# Patient Record
Sex: Female | Born: 1960 | Race: White | Hispanic: No | Smoking: Current every day smoker
Health system: Southern US, Community
[De-identification: ages and names within clinical notes are randomized; demographics above are authoritative.]

## PROBLEM LIST (undated history)

## (undated) DIAGNOSIS — L409 Psoriasis, unspecified: Secondary | ICD-10-CM

## (undated) DIAGNOSIS — R011 Cardiac murmur, unspecified: Secondary | ICD-10-CM

## (undated) DIAGNOSIS — L299 Pruritus, unspecified: Secondary | ICD-10-CM

## (undated) DIAGNOSIS — Z972 Presence of dental prosthetic device (complete) (partial): Secondary | ICD-10-CM

## (undated) DIAGNOSIS — IMO0001 Reserved for inherently not codable concepts without codable children: Secondary | ICD-10-CM

## (undated) DIAGNOSIS — J302 Other seasonal allergic rhinitis: Secondary | ICD-10-CM

## (undated) DIAGNOSIS — K219 Gastro-esophageal reflux disease without esophagitis: Secondary | ICD-10-CM

## (undated) DIAGNOSIS — F17200 Nicotine dependence, unspecified, uncomplicated: Secondary | ICD-10-CM

## (undated) DIAGNOSIS — L309 Dermatitis, unspecified: Secondary | ICD-10-CM

## (undated) DIAGNOSIS — Z8679 Personal history of other diseases of the circulatory system: Secondary | ICD-10-CM

## (undated) DIAGNOSIS — H539 Unspecified visual disturbance: Secondary | ICD-10-CM

## (undated) DIAGNOSIS — F40240 Claustrophobia: Secondary | ICD-10-CM

## (undated) DIAGNOSIS — R112 Nausea with vomiting, unspecified: Secondary | ICD-10-CM

## (undated) DIAGNOSIS — Z9889 Other specified postprocedural states: Secondary | ICD-10-CM

## (undated) HISTORY — PX: CERVICAL DISCECTOMY: SHX98

## (undated) HISTORY — DX: Psoriasis, unspecified: L40.9

## (undated) HISTORY — DX: Nicotine dependence, unspecified, uncomplicated: F17.200

## (undated) HISTORY — PX: ROTATOR CUFF REPAIR: SHX139

## (undated) HISTORY — DX: Pruritus, unspecified: L29.9

## (undated) HISTORY — DX: Other seasonal allergic rhinitis: J30.2

## (undated) HISTORY — DX: Personal history of other diseases of the circulatory system: Z86.79

## (undated) HISTORY — DX: Claustrophobia: F40.240

## (undated) HISTORY — DX: Nausea with vomiting, unspecified: R11.2

## (undated) HISTORY — DX: Unspecified visual disturbance: H53.9

## (undated) HISTORY — DX: Other specified postprocedural states: Z98.890

## (undated) HISTORY — DX: Reserved for inherently not codable concepts without codable children: IMO0001

---

## 1995-01-27 ENCOUNTER — Inpatient Hospital Stay (HOSPITAL_BASED_OUTPATIENT_CLINIC_OR_DEPARTMENT_OTHER)
Admission: RE | Admit: 1995-01-27 | Disposition: A | Discharge status: Home / Self Care | Payer: Self-pay | Source: Ambulatory Visit | Admitting: Specialist

## 1996-07-05 ENCOUNTER — Inpatient Hospital Stay (HOSPITAL_BASED_OUTPATIENT_CLINIC_OR_DEPARTMENT_OTHER)
Admission: RE | Admit: 1996-07-05 | Disposition: A | Discharge status: Home / Self Care | Payer: Self-pay | Source: Ambulatory Visit | Admitting: Specialist

## 2001-05-20 ENCOUNTER — Encounter: Payer: Self-pay | Admitting: Neurosurgery

## 2001-05-24 ENCOUNTER — Encounter: Payer: Self-pay | Admitting: Neurosurgery

## 2001-05-24 ENCOUNTER — Inpatient Hospital Stay (HOSPITAL_COMMUNITY): Admission: RE | Admit: 2001-05-24 | Discharge: 2001-05-25 | Payer: Self-pay | Admitting: Neurosurgery

## 2001-06-08 HISTORY — PX: APPENDECTOMY (OPEN): SHX54

## 2001-10-06 HISTORY — PX: VARICOSE VEIN SURGERY: SHX832

## 2004-12-12 ENCOUNTER — Emergency Department: Admit: 2004-12-12 | Payer: Self-pay | Source: Emergency Department | Admitting: Emergency Medicine

## 2004-12-12 ENCOUNTER — Emergency Department: Admit: 2004-12-12 | Payer: Self-pay | Source: Emergency Department

## 2005-07-08 DIAGNOSIS — B3731 Acute candidiasis of vulva and vagina: Secondary | ICD-10-CM | POA: Insufficient documentation

## 2005-12-21 DIAGNOSIS — R9389 Abnormal findings on diagnostic imaging of other specified body structures: Secondary | ICD-10-CM | POA: Insufficient documentation

## 2005-12-21 DIAGNOSIS — R072 Precordial pain: Secondary | ICD-10-CM | POA: Insufficient documentation

## 2005-12-21 DIAGNOSIS — J342 Deviated nasal septum: Secondary | ICD-10-CM | POA: Insufficient documentation

## 2007-10-26 DIAGNOSIS — J029 Acute pharyngitis, unspecified: Secondary | ICD-10-CM | POA: Insufficient documentation

## 2008-01-02 DIAGNOSIS — E559 Vitamin D deficiency, unspecified: Secondary | ICD-10-CM | POA: Insufficient documentation

## 2008-07-23 ENCOUNTER — Ambulatory Visit
Admit: 2008-07-23 | Disposition: A | Discharge status: Home / Self Care | Payer: Self-pay | Source: Ambulatory Visit | Admitting: Plastic Surgery

## 2008-12-21 ENCOUNTER — Ambulatory Visit: Admission: RE | Admit: 2008-12-21 | Payer: Self-pay | Source: Ambulatory Visit | Admitting: Specialist

## 2009-04-17 ENCOUNTER — Ambulatory Visit: Admission: RE | Admit: 2009-04-17 | Payer: Self-pay | Source: Ambulatory Visit | Admitting: Plastic Surgery

## 2009-06-08 HISTORY — PX: LUMPECTOMY: SHX4764

## 2009-06-08 HISTORY — PX: AUGMENTATION, BREAST, IMPLANT (MEDICAL): SHX3208

## 2010-01-27 DIAGNOSIS — F32A Depression, unspecified: Secondary | ICD-10-CM | POA: Insufficient documentation

## 2010-06-28 ENCOUNTER — Emergency Department: Payer: Self-pay | Admitting: Emergency Medicine

## 2010-09-07 ENCOUNTER — Emergency Department: Payer: Self-pay | Admitting: Emergency Medicine

## 2011-03-27 NOTE — Op Note (Unsigned)
Account Number: [id number]      Document ID: [id number]      Admit Date: 04/17/2009      Procedure Date: 04/17/2009            Patient Location: DISCHARGED 04/17/2009      Patient Type: A            SURGEON: Marybelle Killings MD      ASSISTANT:                  PREOPERATIVE DIAGNOSES:      Hypomastia and facial aging.            POSTOPERATIVE DIAGNOSES:      Hypomastia and facial aging.            TITLES OF PROCEDURES:      1.  Transaxial breast augmentation, on the left 275 mL filled to 300 mL, on      the right 275 mL filled to 300 mL, with saline Mentor implants.      2.  Fat grafting, left cheek 7 mL, right cheek 7 mL, left labiomandibular      fold 3 mL, right labiomandibular fold 3 mL, left nasolabial fold 3 mL,      right nasolabial fold 3 mL, upper lip 5 mL mL, lower lip 5 mL, total of 36      mL.            ANESTHESIA:      LMA, with local anesthesia.            ESTIMATED BLOOD LOSS:      Minimal.            DRAINS:      None.            SPECIMENS:      None.            COMPLICATIONS:      None.            INDICATIONS:      The patient is well known to me from having done multiple fillers on her in      the past.  She came now requesting a breast augmentation and fat grafting      to her face.  I discussed with her the procedure at length, and she was      scheduled to have that earlier this year; however, she had a right breast      mass that was found on mammogram that delayed the surgery, underwent a      lumpectomy, and that turned out to be benign, so patient delayed her      surgery.  After she was cleared by her medical doctor, she came back to      have breast augmentation, as well as fat grafting for facial rejuvenation.            PAST MEDICAL HISTORY:      Significant for right breast lumpectomy in July 2010.  Otherwise, she is on      no medications, no allergies, does not smoke, and was cleared by her      medical doctor.            PHYSICAL EXAMINATION:      She had hypomastia with mild-to-moderate  ptosis, and she does have paucity      of fat in her face.  So we discussed breast augmentation.  She liked the  300 mL range, from 275 to 300 mL, and she wanted some top fullness.  She      also wanted some permanent filler for her face, and she elected to have fat      grafting.  I discussed with her the risks and benefits of both operations.      I showed her before and after pictures and the different risks and      complications associated with each.  After multiple visits, the patient was      very clear as to the procedure, and she elected to have a breast      augmentation through a transaxillary approach with saline implants, as well      as fat grafting to her face.            DESCRIPTION OF PROCEDURE:      The patient was identified, was taken to the operating room.  In the      operating room, she was given preoperative IV antibiotics, pneumatics      placed on lower extremities, and was intubated uneventfully.  The first      operation was the breast augmentation.  The breasts were infiltrated with      tumescent solution.  After that was allowed to work, she was prepped and      draped in normal sterile fashion.  Incisions were made in the axillae.      Dissection was carried out to create submuscular pockets.  Sizer implants      were placed and filled to 300 mL each.  The patient was sat up and noted to      have nice shape and size of her breasts, and that was along what the      patient had requested in terms of size.  The sizers were then removed.  The      area was prepped and draped again.  The surgeon changed his gloves, and the      Smooth Round Saline Mentor implant, 275 mL, was placed in the left pocket      and filled to 300 mL with saline.  Also, another 275 mL Smooth Round Saline      Mentor implant was placed in the right pocket and filled to 300 mL with      saline.  The patient was sat up and noted to have nice shape and size of      her breasts, so the filling tubes were removed,  and incisions were closed      with 3-0 Vicryl sutures and 4-0 Vicryl subcuticular sutures.  Steri-Strips      were placed.  Dressings were placed.  The patient was placed in a bra.            Next operation was her fat grafting to the face.  The face and the abdomen      were prepped and draped in normal sterile fashion.  Fat was harvested from      the abdomen in a [employer] fashion.  It was centrifuged, and the      supernatant fluid was removed, as well as the oily layer.  Fat was filled      in 1 mL syringes and was injected to the cheeks, labiomental folds,      labiomandibular folds, labial folds, upper lips, and lower lips, for a      total 36 mL, as stated in the brief operative note in  a Mirian Capuchin      fashion.  There were no incisions to be closed, so patient was then      awakened and noted to have nice symmetry in her face after the fat      grafting.  She was taken to the recovery room after she was placed in a bra      and was in no apparent distress.                              _______________________________     Date/Time Signed: _____________      Marybelle Killings MD ([id number])            D:  04/17/2009 15:54 PM by Dr. Marybelle Killings, MD ([id number])      T:  04/17/2009 22:35 PM by [id number]          Everlean Cherry: [id number]) (Doc ID: [id number])                  WN:UUVOZD Shahara Hartsfield MD      Patient Chart

## 2011-03-27 NOTE — Op Note (Signed)
Account Number: [id number]      Document ID: [id number]      Admit Date: 12/21/2008      Procedure Date: 12/21/2008            Patient Location: [id number]      Patient Type: A            SURGEON: Barbara Cower MD      ASSISTANT:  Marilynne Halsted MD                  PREOPERATIVE DIAGNOSIS:      Mass, right breast.            POSTOPERATIVE DIAGNOSIS:      Mass, right breast.            TITLE OF PROCEDURE:      Lumpectomy, right breast, with ultrasound needle localization.            INDICATIONS:      The patient is a 50 year old female being evaluated in the office for a      lump in the right breast, and she was advised to have this removed.      Surgery, benefits, risks and potential complications were discussed with      her.  She understood and accepted the plan as outlined.            DESCRIPTION OF PROCEDURE:      The patient was placed in the supine position on the operating table.      Following successful needle localization by the ultrasound department, she      was given IV sedation.  A gram of Ancef was administered intravenously as      were pneumatic stockings.  The right breast was then prepped and draped in      the usual sterile manner after the needle had been cut to just an inch      above skin level.            A small incision was outlined in the areolar edge      between the 7 and 9 o'clock time zones.  This area was first infiltrated      with 1% lidocaine combined with 0.5% Marcaine.  Incision was then scored      with a 15 blade knife, carried down through the subcutaneous tissue.      Bleeders that were encountered were cauterized.  The needle was then      further developed into the incision with careful dissection.  The breast      tissue was carefully dissected away from the areolar complex.  The needle      apparently was stuck in the skin itself and was therefore removed.  A core      of tissue was then excised underneath the areolar complex and handed off to      the pathology department.  It  contained several dilated ducts and      fibrocystic condition.            Bleeders that were present were electrocauterized, and the area was      irrigated.  Satisfied with hemostasis, then the incision was closed.      Deeper layers were closed with interrupted 3-0 chromic sutures.  Skin was      closed with a 4-0 PDS subcuticular stitch.  Wounds were then cleaned,      dried, dressed with Steri-Strips, 4 x 4's, and tape.  The patient was      awakened and transferred back to the recovery room in satisfactory      condition.  Sponge counts, instrument counts, and needle counts reported to      be correct.  The radiology department confirmed the presence of the nodule      in the specimen, and the specimen was then handed off to the pathology      department.                        Electronic Signing Provider      _______________________________     Date/Time Signed: _____________      Barbara Cower MD [id number])            D:  12/21/2008 11:39 AM by Dr. Barbara Cower, MD [phone])      T:  12/21/2008 14:07 PM by [id number]          Everlean Cherry: [id number]) (Doc ID: [id number])                  cc:Olevia Westervelt MD      Jodelle Red MD

## 2011-07-10 HISTORY — PX: COLONOSCOPY, DIAGNOSTIC (SCREENING): SHX174

## 2011-10-30 ENCOUNTER — Emergency Department: Payer: Self-pay | Admitting: Emergency Medicine

## 2011-10-30 LAB — CBC
HCT: 39.4 % (ref 35.0–47.0)
HGB: 13.4 g/dL (ref 12.0–16.0)
MCH: 35.6 pg — ABNORMAL HIGH (ref 26.0–34.0)
MCHC: 33.9 g/dL (ref 32.0–36.0)
MCV: 105 fL — ABNORMAL HIGH (ref 80–100)
Platelet: 214 10*3/uL (ref 150–440)
RBC: 3.76 10*6/uL — ABNORMAL LOW (ref 3.80–5.20)
RDW: 12.7 % (ref 11.5–14.5)
WBC: 10.7 10*3/uL (ref 3.6–11.0)

## 2011-10-30 LAB — CK TOTAL AND CKMB (NOT AT ARMC)
CK, Total: 70 U/L (ref 21–215)
CK-MB: <0.5 ng/mL — ABNORMAL LOW (ref 0.5–3.6)

## 2011-10-30 LAB — COMPREHENSIVE METABOLIC PANEL
Albumin: 3.7 g/dL (ref 3.4–5.0)
Alkaline Phosphatase: 58 U/L (ref 50–136)
Anion Gap: 9 (ref 7–16)
BUN: 8 mg/dL (ref 7–18)
Bilirubin,Total: 0.5 mg/dL (ref 0.2–1.0)
Calcium, Total: 9 mg/dL (ref 8.5–10.1)
Chloride: 103 mmol/L (ref 98–107)
Co2: 24 mmol/L (ref 21–32)
Creatinine: 0.52 mg/dL — ABNORMAL LOW (ref 0.60–1.30)
EGFR (African American): >60
EGFR (Non-African Amer.): >60
Glucose: 93 mg/dL (ref 65–99)
Osmolality: 270 (ref 275–301)
Potassium: 3.4 mmol/L — ABNORMAL LOW (ref 3.5–5.1)
SGOT(AST): 23 U/L (ref 15–37)
SGPT (ALT): 16 U/L
Sodium: 136 mmol/L (ref 136–145)
Total Protein: 7.8 g/dL (ref 6.4–8.2)

## 2011-10-30 LAB — URINALYSIS, COMPLETE
Bacteria: NONE SEEN
Bilirubin,UR: NEGATIVE
Glucose,UR: NEGATIVE mg/dL (ref 0–75)
Leukocyte Esterase: NEGATIVE
Nitrite: NEGATIVE
Ph: 5 (ref 4.5–8.0)
Protein: NEGATIVE
RBC,UR: <1 /HPF (ref 0–5)
Specific Gravity: 1.012 (ref 1.003–1.030)
Squamous Epithelial: 4
WBC UR: 1 /HPF (ref 0–5)

## 2011-10-30 LAB — TROPONIN I: Troponin-I: <0.02 ng/mL

## 2011-10-30 LAB — AMYLASE: Amylase: 37 U/L (ref 25–115)

## 2011-10-30 LAB — LIPASE, BLOOD: Lipase: 165 U/L (ref 73–393)

## 2012-06-16 DIAGNOSIS — R634 Abnormal weight loss: Secondary | ICD-10-CM | POA: Insufficient documentation

## 2012-11-11 DIAGNOSIS — G4700 Insomnia, unspecified: Secondary | ICD-10-CM | POA: Insufficient documentation

## 2013-03-06 DIAGNOSIS — S93409A Sprain of unspecified ligament of unspecified ankle, initial encounter: Secondary | ICD-10-CM | POA: Insufficient documentation

## 2013-03-17 DIAGNOSIS — L709 Acne, unspecified: Secondary | ICD-10-CM | POA: Insufficient documentation

## 2013-07-17 DIAGNOSIS — R209 Unspecified disturbances of skin sensation: Secondary | ICD-10-CM | POA: Insufficient documentation

## 2014-12-06 ENCOUNTER — Emergency Department (HOSPITAL_COMMUNITY): Payer: Self-pay

## 2014-12-06 ENCOUNTER — Emergency Department (HOSPITAL_COMMUNITY)
Admission: EM | Admit: 2014-12-06 | Discharge: 2014-12-06 | Disposition: A | Discharge status: Home / Self Care | Payer: Self-pay | Attending: Emergency Medicine | Admitting: Emergency Medicine

## 2014-12-06 ENCOUNTER — Encounter (HOSPITAL_COMMUNITY): Payer: Self-pay | Admitting: Cardiology

## 2014-12-06 DIAGNOSIS — R1013 Epigastric pain: Secondary | ICD-10-CM | POA: Insufficient documentation

## 2014-12-06 DIAGNOSIS — R1011 Right upper quadrant pain: Secondary | ICD-10-CM | POA: Insufficient documentation

## 2014-12-06 DIAGNOSIS — Z91040 Latex allergy status: Secondary | ICD-10-CM | POA: Insufficient documentation

## 2014-12-06 DIAGNOSIS — R1012 Left upper quadrant pain: Secondary | ICD-10-CM | POA: Insufficient documentation

## 2014-12-06 DIAGNOSIS — R112 Nausea with vomiting, unspecified: Secondary | ICD-10-CM | POA: Insufficient documentation

## 2014-12-06 DIAGNOSIS — Z720 Tobacco use: Secondary | ICD-10-CM | POA: Insufficient documentation

## 2014-12-06 DIAGNOSIS — R05 Cough: Secondary | ICD-10-CM | POA: Insufficient documentation

## 2014-12-06 DIAGNOSIS — R079 Chest pain, unspecified: Secondary | ICD-10-CM | POA: Insufficient documentation

## 2014-12-06 DIAGNOSIS — Z79899 Other long term (current) drug therapy: Secondary | ICD-10-CM | POA: Insufficient documentation

## 2014-12-06 LAB — COMPREHENSIVE METABOLIC PANEL
ALK PHOS: 71 U/L (ref 38–126)
ALT: 18 U/L (ref 14–54)
AST: 35 U/L (ref 15–41)
Albumin: 4.6 g/dL (ref 3.5–5.0)
Anion gap: 14 (ref 5–15)
BUN: 19 mg/dL (ref 6–20)
CALCIUM: 10 mg/dL (ref 8.9–10.3)
CHLORIDE: 87 mmol/L — AB (ref 101–111)
CO2: 31 mmol/L (ref 22–32)
CREATININE: 1.07 mg/dL — AB (ref 0.44–1.00)
GFR calc Af Amer: >60 mL/min (ref >60)
GFR calc non Af Amer: 58 mL/min — ABNORMAL LOW (ref >60)
Glucose, Bld: 129 mg/dL — ABNORMAL HIGH (ref 65–99)
Potassium: 2.9 mmol/L — ABNORMAL LOW (ref 3.5–5.1)
SODIUM: 132 mmol/L — AB (ref 135–145)
Total Bilirubin: 1.1 mg/dL (ref 0.3–1.2)
Total Protein: 8.3 g/dL — ABNORMAL HIGH (ref 6.5–8.1)

## 2014-12-06 LAB — CBC WITH DIFFERENTIAL/PLATELET
BASOS ABS: 0 10*3/uL (ref 0.0–0.1)
Basophils Relative: 0 % (ref 0–1)
EOS ABS: 0.1 10*3/uL (ref 0.0–0.7)
EOS PCT: 1 % (ref 0–5)
HCT: 41.9 % (ref 36.0–46.0)
Hemoglobin: 14.9 g/dL (ref 12.0–15.0)
LYMPHS PCT: 21 % (ref 12–46)
Lymphs Abs: 2.2 10*3/uL (ref 0.7–4.0)
MCH: 34.8 pg — ABNORMAL HIGH (ref 26.0–34.0)
MCHC: 35.6 g/dL (ref 30.0–36.0)
MCV: 97.9 fL (ref 78.0–100.0)
Monocytes Absolute: 1.5 10*3/uL — ABNORMAL HIGH (ref 0.1–1.0)
Monocytes Relative: 14 % — ABNORMAL HIGH (ref 3–12)
NEUTROS PCT: 64 % (ref 43–77)
Neutro Abs: 6.7 10*3/uL (ref 1.7–7.7)
PLATELETS: 315 10*3/uL (ref 150–400)
RBC: 4.28 MIL/uL (ref 3.87–5.11)
RDW: 11.8 % (ref 11.5–15.5)
WBC: 10.4 10*3/uL (ref 4.0–10.5)

## 2014-12-06 LAB — URINE MICROSCOPIC-ADD ON

## 2014-12-06 LAB — I-STAT TROPONIN, ED: Troponin i, poc: 0 ng/mL (ref 0.00–0.08)

## 2014-12-06 LAB — URINALYSIS, ROUTINE W REFLEX MICROSCOPIC
Glucose, UA: NEGATIVE mg/dL
Ketones, ur: 15 mg/dL — AB
Leukocytes, UA: NEGATIVE
Nitrite: NEGATIVE
Protein, ur: >300 mg/dL — AB
SPECIFIC GRAVITY, URINE: 1.031 — AB (ref 1.005–1.030)
Urobilinogen, UA: 0.2 mg/dL (ref 0.0–1.0)
pH: 5.5 (ref 5.0–8.0)

## 2014-12-06 LAB — LIPASE, BLOOD: Lipase: 32 U/L (ref 22–51)

## 2014-12-06 MED ORDER — MORPHINE SULFATE 4 MG/ML IJ SOLN
4.0000 mg | Freq: Once | INTRAMUSCULAR | Status: AC
  Administered 2014-12-06: 4 mg via INTRAVENOUS
  Filled 2014-12-06: qty 1

## 2014-12-06 MED ORDER — SODIUM CHLORIDE 0.9 % IV BOLUS (SEPSIS)
1000.0000 mL | Freq: Once | INTRAVENOUS | Status: AC
  Administered 2014-12-06: 1000 mL via INTRAVENOUS

## 2014-12-06 MED ORDER — ONDANSETRON HCL 4 MG/2ML IJ SOLN
4.0000 mg | Freq: Once | INTRAMUSCULAR | Status: AC
  Administered 2014-12-06: 4 mg via INTRAVENOUS
  Filled 2014-12-06: qty 2

## 2014-12-06 MED ORDER — POTASSIUM CHLORIDE 10 MEQ/100ML IV SOLN
10.0000 meq | Freq: Once | INTRAVENOUS | Status: AC
  Administered 2014-12-06: 10 meq via INTRAVENOUS
  Filled 2014-12-06: qty 100

## 2014-12-06 MED ORDER — ONDANSETRON HCL 4 MG PO TABS: 4.0000 mg | ORAL_TABLET | Freq: Three times a day (TID) | ORAL | Status: AC | PRN

## 2014-12-06 MED ORDER — ALUM & MAG HYDROXIDE-SIMETH 200-200-20 MG/5ML PO SUSP
30.0000 mL | Freq: Once | ORAL | Status: AC
  Administered 2014-12-06: 30 mL via ORAL
  Filled 2014-12-06: qty 30

## 2014-12-06 MED ORDER — POTASSIUM CHLORIDE ER 10 MEQ PO TBCR: 10.0000 meq | EXTENDED_RELEASE_TABLET | Freq: Every day | ORAL | Status: DC

## 2014-12-06 MED ORDER — RANITIDINE HCL 150 MG PO CAPS: 150.0000 mg | ORAL_CAPSULE | Freq: Every day | ORAL | Status: DC

## 2014-12-06 NOTE — ED Provider Notes (Signed)
CSN: [id number]     Arrival date & time 12/06/14  1436 History   First MD Initiated Contact with Patient 12/06/14 1545     Chief Complaint  Patient presents with   Abdominal Pain   Chest Pain     (Consider location/radiation/quality/duration/timing/severity/associated sxs/prior Treatment) Patient is a 54 y.o. female presenting with abdominal pain and chest pain.  Abdominal Pain Pain location:  Epigastric Pain quality: heavy and squeezing   Pain radiates to:  Chest Pain severity:  Severe Onset quality:  Gradual Duration:  2 days Timing:  Constant Progression:  Worsening Chronicity:  Recurrent (once prior, self resolved) Context: not alcohol use   Relieved by:  Nothing Worsened by:  Vomiting and palpation Ineffective treatments:  Eating (pepto) Associated symptoms: chest pain, cough, nausea and vomiting   Associated symptoms: no chills, no constipation, no diarrhea, no dysuria, no fatigue, no fever, no hematemesis, no hematochezia, no melena, no shortness of breath and no sore throat   Vomiting:    Quality:  Stomach contents   Number of occurrences:  10   Severity:  Moderate   Duration:  2 days   Timing:  Intermittent Risk factors: no alcohol abuse   Chest Pain Associated symptoms: abdominal pain, cough, nausea and vomiting   Associated symptoms: no back pain, no dizziness, no fatigue, no fever, no headache, no palpitations, no shortness of breath and no weakness     History reviewed. No pertinent past medical history. History reviewed. No pertinent past surgical history. History reviewed. No pertinent family history. History  Substance Use Topics   Smoking status: Current Every Day Smoker   Smokeless tobacco: Not on file   Alcohol Use: Yes   OB History    No data available     Review of Systems  Constitutional: Negative for fever, chills, appetite change and fatigue.  HENT: Negative for congestion, ear pain, facial swelling, mouth sores and sore throat.    Eyes: Negative for visual disturbance.  Respiratory: Positive for cough. Negative for chest tightness and shortness of breath.   Cardiovascular: Positive for chest pain. Negative for palpitations.  Gastrointestinal: Positive for nausea, vomiting and abdominal pain. Negative for diarrhea, constipation, blood in stool, melena, hematochezia and hematemesis.  Endocrine: Negative for cold intolerance and heat intolerance.  Genitourinary: Negative for dysuria, frequency, decreased urine volume and difficulty urinating.  Musculoskeletal: Negative for back pain and neck stiffness.  Skin: Negative for rash.  Neurological: Negative for dizziness, weakness, light-headedness and headaches.  All other systems reviewed and are negative.     Allergies  Sulfa antibiotics and Latex  Home Medications   Prior to Admission medications   Medication Sig Start Date End Date Taking? Authorizing Provider  ondansetron (ZOFRAN) 4 MG tablet Take 1 tablet (4 mg total) by mouth every 8 (eight) hours as needed for nausea or vomiting. 12/06/14 12/09/14  Drema Pry, MD  potassium chloride (K-DUR) 10 MEQ tablet Take 1 tablet (10 mEq total) by mouth daily. 12/06/14 12/20/14  Drema Pry, MD  ranitidine (ZANTAC) 150 MG capsule Take 1 capsule (150 mg total) by mouth daily. 12/06/14   Drema Pry, MD   BP 139/90 mmHg   Pulse 117   Temp(Src) 98.7 F (37.1 C) (Oral)   Resp 18   Wt 110 lb (49.896 kg)   SpO2 99% Physical Exam  Constitutional: She is oriented to person, place, and time. She appears well-developed and well-nourished. No distress.  HENT:  Head: Normocephalic and atraumatic.  Right Ear: External ear normal.  Left Ear: External ear normal.  Nose: Nose normal.  Eyes: Conjunctivae and EOM are normal. Pupils are equal, round, and reactive to light. Right eye exhibits no discharge. Left eye exhibits no discharge. No scleral icterus.  Neck: Normal range of motion. Neck supple.  Cardiovascular: Normal rate,  regular rhythm and normal heart sounds.  Exam reveals no gallop and no friction rub.   No murmur heard. Pulmonary/Chest: Effort normal and breath sounds normal. No stridor. No respiratory distress. She has no wheezes.  Abdominal: Soft. She exhibits no distension. There is no hepatosplenomegaly. There is tenderness in the right upper quadrant, epigastric area and left upper quadrant. There is guarding. There is no rigidity, no rebound, no CVA tenderness and no tenderness at McBurney's point. No hernia.  Musculoskeletal: She exhibits no edema or tenderness.  Neurological: She is alert and oriented to person, place, and time.  Skin: Skin is warm and dry. No rash noted. She is not diaphoretic. No erythema.  Psychiatric: She has a normal mood and affect.    ED Course  Procedures (including critical care time) Labs Review Labs Reviewed  CBC WITH DIFFERENTIAL/PLATELET - Abnormal; Notable for the following:    MCH 34.8 (*)    Monocytes Relative 14 (*)    Monocytes Absolute 1.5 (*)    All other components within normal limits  COMPREHENSIVE METABOLIC PANEL - Abnormal; Notable for the following:    Sodium 132 (*)    Potassium 2.9 (*)    Chloride 87 (*)    Glucose, Bld 129 (*)    Creatinine, Ser 1.07 (*)    Total Protein 8.3 (*)    GFR calc non Af Amer 58 (*)    All other components within normal limits  URINALYSIS, ROUTINE W REFLEX MICROSCOPIC (NOT AT [facility]) - Abnormal; Notable for the following:    Color, Urine AMBER (*)    APPearance HAZY (*)    Specific Gravity, Urine 1.031 (*)    Hgb urine dipstick MODERATE (*)    Bilirubin Urine SMALL (*)    Ketones, ur 15 (*)    Protein, ur >300 (*)    All other components within normal limits  URINE MICROSCOPIC-ADD ON - Abnormal; Notable for the following:    Squamous Epithelial / LPF MANY (*)    Bacteria, UA FEW (*)    Casts GRANULAR CAST (*)    All other components within normal limits  LIPASE, BLOOD  H. PYLORI ANTIBODY, IGG  I-STAT  TROPOININ, ED    Imaging Review Dg Chest 2 View  12/06/2014   CLINICAL DATA:  Abdominal and chest pain for 2 days, nausea, vomiting, smoker  EXAM: CHEST  2 VIEW  COMPARISON:  10/30/2011  FINDINGS: Normal heart size, mediastinal contours and pulmonary vascularity.  Lungs hyperinflated but clear.  No pleural effusion or pneumothorax.  Bones appear demineralized.  Prior cervical spine fusion.  IMPRESSION: Question COPD.  No acute abnormalities.   Electronically Signed   By: Ulyses Southward M.D.   On: 12/06/2014 16:43   US Abdomen Complete  12/06/2014   CLINICAL DATA:  Epigastric pain  EXAM: ULTRASOUND ABDOMEN COMPLETE  COMPARISON:  10/30/2011  FINDINGS: Gallbladder: No gallstones or wall thickening visualized. No sonographic Murphy sign noted.  Common bile duct: Diameter: 4.6 mm  Liver: No focal lesion identified. Within normal limits in parenchymal echogenicity.  IVC: No abnormality visualized.  Pancreas: Visualized portion unremarkable.  Spleen: Size and appearance within normal limits.  Right Kidney: Length: 10.9 cm. Echogenicity within normal  limits. No mass or hydronephrosis visualized.  Left Kidney: Length: 10.7 cm. Echogenicity within normal limits. No mass or hydronephrosis visualized.  Abdominal aorta: No aneurysm visualized.  Other findings: None.  IMPRESSION: No acute abnormality noted.   Electronically Signed   By: Alcide CleverMark  Lukens M.D.   On: 12/06/2014 19:01     EKG Interpretation   Date/Time:  Thursday December 06 2014 14:47:49 EDT Ventricular Rate:  108 PR Interval:  138 QRS Duration: 74 QT Interval:  364 QTC Calculation: 487 R Axis:   75 Text Interpretation:  Sinus tachycardia Biatrial enlargement Nonspecific  ST and T wave abnormality Abnormal ECG Sinus tachycardia ST-t wave  abnormality Abnormal ekg Confirmed by Gerhard MunchLOCKWOOD, ROBERT  MD ([id number]) on  12/06/2014 5:27:12 PM      MDM   2 days of progressively worsening epigastric pain with associated nausea vomiting. Rest of the history and  exam as above. Diagnostics without evidence of pancreatitis, cholecystitis, cholelithiasis, pneumonia, pleural effusions. Low clinical suspicion for colitis, appendicitis, diverticulitis, small bowel obstruction. EKG with sinus tachycardia and nonspecific ST and T-wave abnormalities with no evidence of acute ischemia, arrhythmia, or blocks. Troponin negative. Low clinical suspicion for ACS. CBC without leukocytosis or anemia. There is evidence of dehydration and the labs. Patient was given multiple liters of IV fluids. Patient also provided with pain and nausea medication. Labs revealed hypokalemia and patient was repeated.  She is in good condition is stable for discharge with strict return precautions. [facility] to establish care with a primary care provider and follow up if her symptoms worsen or do not improve. She is provided prescription for K dur, Zofran, Zantac.  Vision seen in conjunction with Dr. Jeraldine LootsLockwood.  Final diagnoses:  Epigastric pain    Drema PryPedro Haddy Mullinax, MD 12/06/14 2025  Gerhard Munchobert Lockwood, MD 12/07/14 [phone]

## 2014-12-06 NOTE — ED Notes (Signed)
Pt reports abd pain and chest pain for the past 2 days. Reports some n/v. Skin warm and dry.

## 2014-12-06 NOTE — Discharge Instructions (Signed)
Abdominal Pain, Women °Abdominal (stomach, pelvic, or belly) pain can be caused by many things. It is important to tell your doctor: °· The location of the pain. °· Does it come and go or is it present all the time? °· Are there things that start the pain (eating certain foods, exercise)? °· Are there other symptoms associated with the pain (fever, nausea, vomiting, diarrhea)? °All of this is helpful to know when trying to find the cause of the pain. °CAUSES  °· Stomach: virus or bacteria infection, or ulcer. °· Intestine: appendicitis (inflamed appendix), regional ileitis (Crohn's disease), ulcerative colitis (inflamed colon), irritable bowel syndrome, diverticulitis (inflamed diverticulum of the colon), or cancer of the stomach or intestine. °· Gallbladder disease or stones in the gallbladder. °· Kidney disease, kidney stones, or infection. °· Pancreas infection or cancer. °· Fibromyalgia (pain disorder). °· Diseases of the female organs: °¨ Uterus: fibroid (non-cancerous) tumors or infection. °¨ Fallopian tubes: infection or tubal pregnancy. °¨ Ovary: cysts or tumors. °¨ Pelvic adhesions (scar tissue). °¨ Endometriosis (uterus lining tissue growing in the pelvis and on the pelvic organs). °¨ Pelvic congestion syndrome (female organs filling up with blood just before the menstrual period). °¨ Pain with the menstrual period. °¨ Pain with ovulation (producing an egg). °¨ Pain with an IUD (intrauterine device, birth control) in the uterus. °¨ Cancer of the female organs. °· Functional pain (pain not caused by a disease, may improve without treatment). °· Psychological pain. °· Depression. °DIAGNOSIS  °Your doctor will decide the seriousness of your pain by doing an examination. °· Blood tests. °· X-rays. °· Ultrasound. °· CT scan (computed tomography, special type of X-ray). °· MRI (magnetic resonance imaging). °· Cultures, for infection. °· Barium enema (dye inserted in the large intestine, to better view it with  X-rays). °· Colonoscopy (looking in intestine with a lighted tube). °· Laparoscopy (minor surgery, looking in abdomen with a lighted tube). °· Major abdominal exploratory surgery (looking in abdomen with a large incision). °TREATMENT  °The treatment will depend on the cause of the pain.  °· Many cases can be observed and treated at home. °· Over-the-counter medicines recommended by your caregiver. °· Prescription medicine. °· Antibiotics, for infection. °· Birth control pills, for painful periods or for ovulation pain. °· Hormone treatment, for endometriosis. °· Nerve blocking injections. °· Physical therapy. °· Antidepressants. °· Counseling with a psychologist or psychiatrist. °· Minor or major surgery. °HOME CARE INSTRUCTIONS  °· Do not take laxatives, unless directed by your caregiver. °· Take over-the-counter pain medicine only if ordered by your caregiver. Do not take aspirin because it can cause an upset stomach or bleeding. °· Try a clear liquid diet (broth or water) as ordered by your caregiver. Slowly move to a bland diet, as tolerated, if the pain is related to the stomach or intestine. °· Have a thermometer and take your temperature several times a day, and record it. °· Bed rest and sleep, if it helps the pain. °· Avoid sexual intercourse, if it causes pain. °· Avoid stressful situations. °· Keep your follow-up appointments and tests, as your caregiver orders. °· If the pain does not go away with medicine or surgery, you may try: °¨ Acupuncture. °¨ Relaxation exercises (yoga, meditation). °¨ Group therapy. °¨ Counseling. °SEEK MEDICAL CARE IF:  °· You notice certain foods cause stomach pain. °· Your home care treatment is not helping your pain. °· You need stronger pain medicine. °· You want your IUD removed. °· You feel faint or   lightheaded. °· You develop nausea and vomiting. °· You develop a rash. °· You are having side effects or an allergy to your medicine. °SEEK IMMEDIATE MEDICAL CARE IF:  °· Your  pain does not go away or gets worse. °· You have a fever. °· Your pain is felt only in portions of the abdomen. The right side could possibly be appendicitis. The left lower portion of the abdomen could be colitis or diverticulitis. °· You are passing blood in your stools (bright red or black tarry stools, with or without vomiting). °· You have blood in your urine. °· You develop chills, with or without a fever. °· You pass out. °MAKE SURE YOU:  °· Understand these instructions. °· Will watch your condition. °· Will get help right away if you are not doing well or get worse. °Document Released: 03/22/2007 Document Revised: 10/09/2013 Document Reviewed: 04/11/2009 °[employer]® Patient Information ©2015 [employer]. This information is not intended to replace advice given to you by your health care provider. Make sure you discuss any questions you have with your health care provider. ° °

## 2014-12-06 NOTE — ED Notes (Signed)
Spoke to lab who will add on lipase.

## 2014-12-07 LAB — H. PYLORI ANTIBODY, IGG: H Pylori IgG: UNDETERMINED U/mL

## 2014-12-07 MED ORDER — PROMETHAZINE HCL 25 MG PO TABS: 25.0000 mg | ORAL_TABLET | Freq: Four times a day (QID) | ORAL | Status: DC | PRN

## 2014-12-07 NOTE — Care Management Note (Addendum)
Case Management Note  Patient Details  Name: Lynn Black MRN: [id number] Date of Birth: 03/09/1961  Subjective/Objective:               1400 Cm received a call from 54 yr old [facility] and given rx for zofran 4 mg 15 tabs Pt states she can not afford the [facility] price of $73 for 15 tabs.  Pt requests a change in Rx to a low cost nausea med    Action/Plan:  Cm spoke with pt to review her d/c instructions and medicines Cm discussed goodrx costs for zofran at [facility]44 and [facility] $73-95 & k dur costs Cm discussed with pt how to access and use goodrx She has access to internet but not printer and wants to get discount cards/coupons when her friend arrives home  Cm spoke with C Lawyer [facility] NP/PA Medication changed to phenergan 25 mg 1 tab po q 6 hrs prn nausea or vomiting total 10 tabs --see EPIC orders   1430 Cm called pt back at [phone] and reviewed new med order for phenergan CM requested pt discard zofran rx and not to take it back to the [facility] nor another pharmacy Pt informed CM she prefers CM call in rx for phenergan and [facility] [address]" Pt confirms with CM she has all prescriptions with her and not at any pharmacy at this time Pt states she can and prefers to get the zantac OTC versus using the Rx given to her for 150 mg qd total 30 tabs no refills  1533 Cm spoke with Leonette Mostharles at [address], [place], [place] [address] off of [address] at   [phone] Established pt as a new pt Cm called in phenergan 25 mg 1 tab po q 6 hrs prn nausea or vomiting total 10 tabs, no refills and potassium chloride CR 1 tab po qd total of 14 tabs no refills     Additional Comments:  Ophelia ShoulderGibbs, Ahyan Kreeger Louise, RN 12/07/2014, 2:13 PM

## 2015-07-11 DIAGNOSIS — E669 Obesity, unspecified: Secondary | ICD-10-CM | POA: Insufficient documentation

## 2015-07-11 DIAGNOSIS — R635 Abnormal weight gain: Secondary | ICD-10-CM | POA: Insufficient documentation

## 2015-08-04 DIAGNOSIS — Z713 Dietary counseling and surveillance: Secondary | ICD-10-CM | POA: Insufficient documentation

## 2015-08-23 DIAGNOSIS — R0683 Snoring: Secondary | ICD-10-CM | POA: Insufficient documentation

## 2015-09-04 DIAGNOSIS — R7989 Other specified abnormal findings of blood chemistry: Secondary | ICD-10-CM | POA: Insufficient documentation

## 2015-09-04 DIAGNOSIS — N3090 Cystitis, unspecified without hematuria: Secondary | ICD-10-CM | POA: Insufficient documentation

## 2015-09-09 DIAGNOSIS — E782 Mixed hyperlipidemia: Secondary | ICD-10-CM | POA: Insufficient documentation

## 2016-03-09 ENCOUNTER — Emergency Department (HOSPITAL_COMMUNITY)
Admission: EM | Admit: 2016-03-09 | Discharge: 2016-03-09 | Disposition: A | Discharge status: Home / Self Care | Payer: Self-pay | Attending: Emergency Medicine | Admitting: Emergency Medicine

## 2016-03-09 ENCOUNTER — Encounter (HOSPITAL_COMMUNITY): Payer: Self-pay

## 2016-03-09 DIAGNOSIS — Z91040 Latex allergy status: Secondary | ICD-10-CM | POA: Insufficient documentation

## 2016-03-09 DIAGNOSIS — B359 Dermatophytosis, unspecified: Secondary | ICD-10-CM | POA: Insufficient documentation

## 2016-03-09 DIAGNOSIS — L308 Other specified dermatitis: Secondary | ICD-10-CM

## 2016-03-09 DIAGNOSIS — F17200 Nicotine dependence, unspecified, uncomplicated: Secondary | ICD-10-CM | POA: Insufficient documentation

## 2016-03-09 HISTORY — DX: Dermatitis, unspecified: L30.9

## 2016-03-09 MED ORDER — PREDNISONE 20 MG PO TABS: ORAL_TABLET | ORAL | 0 refills | Status: DC

## 2016-03-09 MED ORDER — TRIAMCINOLONE ACETONIDE 0.1 % EX CREA: 1.0000 "application " | TOPICAL_CREAM | Freq: Two times a day (BID) | CUTANEOUS | 3 refills | Status: DC

## 2016-03-09 MED ORDER — KETOCONAZOLE 2 % EX CREA: 1.0000 "application " | TOPICAL_CREAM | Freq: Every day | CUTANEOUS | 0 refills | Status: DC

## 2016-03-09 NOTE — ED Notes (Signed)
Declined W/C at D/C and was escorted to lobby by RN. °

## 2016-03-09 NOTE — ED Triage Notes (Signed)
Per pT, Pt is coming from home with complaints of rash noted to the hands and feet bilaterally, along with the neck. Pt reports hx of eczema with no relief from medication.

## 2016-03-09 NOTE — ED Provider Notes (Signed)
[facility] Provider Note   CSN: [id number] Arrival date & time: 03/09/16  1054  By signing my name below, I, Majel HomerPeyton Lee, attest that this documentation has been prepared under the direction and in the presence of non-physician practitioner, Arthor CaptainAbigail Dailee Manalang, PA-C. Electronically Signed: Majel HomerPeyton Lee, Scribe. 03/09/2016. 1:17 PM.  History   Chief Complaint Chief Complaint  Patient presents with   Rash   The history is provided by the patient. No language interpreter was used.   HPI Comments: Lynn Black is a 55 y.o. female who presents to the Emergency Department for an evaluation of gradually worsening, generalized rash to her bilateral hands, feet and neck. She notes hx of eczema and believes her symptoms are an exacerbation of this; though, her rash is the "worst it has ever been" on her feet. She notes she has applied gold bond topical cream with no relief. Pt denies recent change in lotions and soap, fever and hx of asthma. She states hx of smoking 5 cigarettes a day.   Past Medical History:  Diagnosis Date   Eczema    There are no active problems to display for this patient.  History reviewed. No pertinent surgical history.  OB History    No data available       Home Medications    Prior to Admission medications   Medication Sig Start Date End Date Taking? Authorizing Provider  ketoconazole (NIZORAL) 2 % cream Apply 1 application topically daily. To hands and feet 03/09/16   Arthor CaptainAbigail Jessup Ogas, PA-C  potassium chloride (K-DUR) 10 MEQ tablet Take 1 tablet (10 mEq total) by mouth daily. 12/06/14 12/20/14  Nira ConnPedro Eduardo Cardama, MD  predniSONE (DELTASONE) 20 MG tablet 3 tabs po daily x 3 days, then 2 tabs x 3 days, then 1.5 tabs x 3 days, then 1 tab x 3 days, then 0.5 tabs x 3 days 03/09/16   Arthor CaptainAbigail Anshu Wehner, PA-C  promethazine (PHENERGAN) 25 MG tablet Take 1 tablet (25 mg total) by mouth every 6 (six) hours as needed for nausea or vomiting. 12/07/14   Charlestine Nighthristopher Lawyer, PA-C   ranitidine (ZANTAC) 150 MG capsule Take 1 capsule (150 mg total) by mouth daily. 12/06/14   Nira ConnPedro Eduardo Cardama, MD  triamcinolone cream (KENALOG) 0.1 % Apply 1 application topically 2 (two) times daily. 03/09/16   Arthor CaptainAbigail Christo Hain, PA-C    Family History No family history on file.  Social History Social History  Substance Use Topics   Smoking status: Current Every Day Smoker   Smokeless tobacco: Never Used   Alcohol use Yes     Allergies   Nickel; Sulfa antibiotics; and Latex   Review of Systems Review of Systems  Constitutional: Negative for fever.  Skin: Positive for rash.   Physical Exam Updated Vital Signs BP (!) 176/103 (BP Location: Left Arm)    Pulse 97    Temp 98 F (36.7 C) (Oral)    Resp 18    Ht 5\' 2"  (1.575 m)    Wt 110 lb (49.9 kg)    SpO2 100%    BMI 20.12 kg/m   Physical Exam  Constitutional: She is oriented to person, place, and time. She appears well-developed and well-nourished.  HENT:  Head: Normocephalic.  Eyes: EOM are normal.  Neck: Normal range of motion.  Pulmonary/Chest: Effort normal.  Abdominal: She exhibits no distension.  Musculoskeletal: Normal range of motion.  Neurological: She is alert and oriented to person, place, and time.  Skin: Rash noted.  Eczematic erruption with  dry scaling skin on her hands, feet, chin and neck.   Psychiatric: She has a normal mood and affect.  Nursing note and vitals reviewed.  ED Treatments / Results  Labs (all labs ordered are listed, but only abnormal results are displayed) Labs Reviewed - No data to display  EKG  EKG Interpretation None       Radiology No results found.  Procedures Procedures (including critical care time)  Medications Ordered in ED Medications - No data to display  DIAGNOSTIC STUDIES:  Oxygen Saturation is 100% on RA, normal by my interpretation.    COORDINATION OF CARE:  1:13 PM Discussed treatment plan with pt at bedside and pt agreed to plan.  Initial  Impression / Assessment and Plan / ED Course  I have reviewed the triage vital signs and the nursing notes.  Pertinent labs & imaging results that were available during my care of the patient were reviewed by me and considered in my medical decision making (see chart for details).  Clinical Course    Discussed treatment plan. No signs of secondary infection. Appears eczematous (dishydrotic eczema) and does not look to be similar to HFM, Secondary syphillis, or scabies.  The patient was counseled on the dangers of tobacco use, and was advised to quit.  Reviewed strategies to maximize success, including removing cigarettes and smoking materials from environment, stress management, substitution of other forms of reinforcement and support of family/friends.   I personally performed the services described in this documentation, which was scribed in my presence. The recorded information has been reviewed and is accurate.   Final Clinical Impressions(s) / ED Diagnoses   Final diagnoses:  Other eczema  Tinea    New Prescriptions Discharge Medication List as of 03/09/2016  1:28 PM    START taking these medications   Details  ketoconazole (NIZORAL) 2 % cream Apply 1 application topically daily. To hands and feet, Starting Mon 03/09/2016, Print    predniSONE (DELTASONE) 20 MG tablet 3 tabs po daily x 3 days, then 2 tabs x 3 days, then 1.5 tabs x 3 days, then 1 tab x 3 days, then 0.5 tabs x 3 days, Print    triamcinolone cream (KENALOG) 0.1 % Apply 1 application topically 2 (two) times daily., Starting Mon 03/09/2016, Print         [place], PA-C 03/11/16 1616    Donnetta Hutching, MD 03/11/16 2008

## 2016-06-20 ENCOUNTER — Emergency Department (HOSPITAL_COMMUNITY)
Admission: EM | Admit: 2016-06-20 | Discharge: 2016-06-20 | Disposition: A | Discharge status: Home / Self Care | Payer: Self-pay | Attending: Emergency Medicine | Admitting: Emergency Medicine

## 2016-06-20 ENCOUNTER — Encounter (HOSPITAL_COMMUNITY): Payer: Self-pay | Admitting: *Deleted

## 2016-06-20 DIAGNOSIS — Z91040 Latex allergy status: Secondary | ICD-10-CM | POA: Insufficient documentation

## 2016-06-20 DIAGNOSIS — F17210 Nicotine dependence, cigarettes, uncomplicated: Secondary | ICD-10-CM | POA: Insufficient documentation

## 2016-06-20 DIAGNOSIS — L301 Dyshidrosis [pompholyx]: Secondary | ICD-10-CM | POA: Insufficient documentation

## 2016-06-20 DIAGNOSIS — B359 Dermatophytosis, unspecified: Secondary | ICD-10-CM | POA: Insufficient documentation

## 2016-06-20 MED ORDER — KETOCONAZOLE 2 % EX CREA: 1.0000 "application " | TOPICAL_CREAM | Freq: Every day | CUTANEOUS | 0 refills | Status: DC

## 2016-06-20 MED ORDER — PREDNISONE 10 MG (21) PO TBPK: 10.0000 mg | ORAL_TABLET | Freq: Every day | ORAL | 0 refills | Status: DC

## 2016-06-20 MED ORDER — TRIAMCINOLONE ACETONIDE 0.1 % EX CREA: 1.0000 "application " | TOPICAL_CREAM | Freq: Two times a day (BID) | CUTANEOUS | 0 refills | Status: DC

## 2016-06-20 NOTE — ED Notes (Signed)
PT ambulates to restroom without assistance

## 2016-06-20 NOTE — ED Triage Notes (Addendum)
PT states hx of eczema and states eczema to palms of hands and bottoms of feet.  States since Sunday noted tingling, numbness and pain to tips of fingers and toes. No other deficits.

## 2016-06-20 NOTE — ED Provider Notes (Signed)
[facility] Provider Note   CSN: [id number] Arrival date & time: 06/20/16  1032     History   Chief Complaint Chief Complaint  Patient presents with   Eczema   Tingling    HPI Lynn Black is a 56 y.o. female a history of eczema presenting with worsening rash of her palms and soles. She describes it as a pruritic rash which is now cracking. She states that she has Aquaphor at home and is not using it because it is not helping. She was prescribed creams a couple months ago which helped, but she is currently out. She sometimes takes Benadryl with only mild relief. She also states that she now feels some pins and needles at the site of her finger and reports a history of Raynaud's phenomenon. She denies fever, chills, nausea, vomiting, or any other symptoms.  HPI  Past Medical History:  Diagnosis Date   Eczema     There are no active problems to display for this patient.   Past Surgical History:  Procedure Laterality Date   ROTATOR CUFF REPAIR Right     OB History    No data available       Home Medications    Prior to Admission medications   Medication Sig Start Date End Date Taking? Authorizing Provider  ketoconazole (NIZORAL) 2 % cream Apply 1 application topically daily. 06/20/16   Georgiana Shore, PA-C  potassium chloride (K-DUR) 10 MEQ tablet Take 1 tablet (10 mEq total) by mouth daily. Patient not taking: Reported on 06/20/2016 12/06/14 12/20/14  Nira Conn, MD  predniSONE (STERAPRED UNI-PAK 21 TAB) 10 MG (21) TBPK tablet Take 1 tablet (10 mg total) by mouth daily. Take 6 tabs by mouth daily  for 2 days, then 5 tabs for 2 days, then 4 tabs for 2 days, then 3 tabs for 2 days, 2 tabs for 2 days, then 1 tab by mouth daily for 2 days 06/20/16   Georgiana Shore, PA-C  promethazine (PHENERGAN) 25 MG tablet Take 1 tablet (25 mg total) by mouth every 6 (six) hours as needed for nausea or vomiting. Patient not taking: Reported on 06/20/2016 12/07/14    Charlestine Night, PA-C  ranitidine (ZANTAC) 150 MG capsule Take 1 capsule (150 mg total) by mouth daily. Patient not taking: Reported on 06/20/2016 12/06/14   Nira Conn, MD  triamcinolone cream (KENALOG) 0.1 % Apply 1 application topically 2 (two) times daily. 06/20/16   Georgiana Shore, PA-C    Family History No family history on file.  Social History Social History  Substance Use Topics   Smoking status: Current Every Day Smoker    Packs/day: 0.50    Types: Cigarettes   Smokeless tobacco: Never Used   Alcohol use Yes     Comment: 2 beers per day     Allergies   Nickel; Other; Sulfa antibiotics; and Latex   Review of Systems Review of Systems  Constitutional: Negative for chills and fever.  HENT: Negative for ear pain and sore throat.   Eyes: Negative for pain and visual disturbance.  Respiratory: Negative for cough, chest tightness, shortness of breath and wheezing.   Cardiovascular: Negative for chest pain and palpitations.  Gastrointestinal: Negative for abdominal pain, nausea and vomiting.  Genitourinary: Negative for dysuria and hematuria.  Musculoskeletal: Negative for neck pain and neck stiffness.  Skin: Positive for color change and rash. Negative for pallor.       Patient reports worsening eczema rash on  the palms and soles.  Neurological: Negative for seizures and syncope.  All other systems reviewed and are negative.    Physical Exam Updated Vital Signs BP 162/92 (BP Location: Left Arm)    Pulse 107    Temp 98.8 F (37.1 C) (Oral)    Resp 15    Ht 5\' 2"  (1.575 m)    Wt 49.9 kg    SpO2 100%    BMI 20.12 kg/m   Physical Exam  Constitutional: She appears well-developed and well-nourished. No distress.  HENT:  Head: Normocephalic and atraumatic.  Eyes: Conjunctivae and EOM are normal.  Neck: Normal range of motion. Neck supple.  Cardiovascular: Normal rate, regular rhythm and normal heart sounds.   No murmur heard. Pulmonary/Chest:  Effort normal and breath sounds normal. No respiratory distress. She has no wheezes. She has no rales.  Musculoskeletal: She exhibits no edema.  Neurological: She is alert. No sensory deficit.  Patient reports pins and needles at the tip of her fingers bilaterally. On exam she has no sensory deficits.  Skin: Skin is warm and dry. She is not diaphoretic. There is erythema.  Entire volar aspects of the palms are erythematous bilaterally with dyshidrotic-type eczema at the Center. There are some cracking of the skin.   Soles of her feet have similar rash, with areas of cracking skin is well.  Psychiatric: She has a normal mood and affect. Her behavior is normal.  Nursing note and vitals reviewed.    ED Treatments / Results  Labs (all labs ordered are listed, but only abnormal results are displayed) Labs Reviewed - No data to display  EKG  EKG Interpretation None       Radiology No results found.  Procedures Procedures (including critical care time)  Medications Ordered in ED Medications - No data to display   Initial Impression / Assessment and Plan / ED Course  I have reviewed the triage vital signs and the nursing notes.  Pertinent labs & imaging results that were available during my care of the patient were reviewed by me and considered in my medical decision making (see chart for details).  Clinical Course   Otherwise healthy 56 year old female presenting with worsening dyshidrotic eczema of the palms and soles. She endorses some pins and needles of the fingers but has good sensation equally bilaterally.   She denies the use of any medications at home. She has tried Aquaphor and doesn't feel like it is helping. She reports a chronic history of what sounds like Raynaud's phenomenon.  Dr. Verdie Mosher has seen patient and she reported having results with the last regimen prescribed.  Will cover for potential fungal infection supraimposed on dyshidrotic eczema. Discharge home  with prednisone taper, triamcinolone and patient was provided with primary care provider contact so that she can follow up for chronic eczema.  Discussed strict return precautions. Patient was advised to return to the emergency department if experiencing any worsening of symptoms. She understood instructions and agreed with discharge plan.  Final Clinical Impressions(s) / ED Diagnoses   Final diagnoses:  Dyshidrotic eczema  Tinea    New Prescriptions Discharge Medication List as of 06/20/2016  2:36 PM    START taking these medications   Details  predniSONE (STERAPRED UNI-PAK 21 TAB) 10 MG (21) TBPK tablet Take 1 tablet (10 mg total) by mouth daily. Take 6 tabs by mouth daily  for 2 days, then 5 tabs for 2 days, then 4 tabs for 2 days, then 3 tabs for 2  days, 2 tabs for 2 days, then 1 tab by mouth daily for 2 days, Starting Sat 06/20/2016, Print    !! triamcinolone cream (KENALOG) 0.1 % Apply 1 application topically 2 (two) times daily., Starting Sat 06/20/2016, Print     !! - Potential duplicate medications found. Please discuss with provider.       Georgiana ShoreJessica B Song Myre, PA-C 06/20/16 1730    Lavera Guiseana Duo Liu, MD 06/20/16 [phone]

## 2016-07-12 ENCOUNTER — Encounter (HOSPITAL_COMMUNITY): Payer: Self-pay | Admitting: Emergency Medicine

## 2016-07-12 ENCOUNTER — Emergency Department (HOSPITAL_COMMUNITY)
Admission: EM | Admit: 2016-07-12 | Discharge: 2016-07-12 | Disposition: A | Discharge status: Home / Self Care | Payer: Self-pay | Attending: Emergency Medicine | Admitting: Emergency Medicine

## 2016-07-12 DIAGNOSIS — F17210 Nicotine dependence, cigarettes, uncomplicated: Secondary | ICD-10-CM | POA: Insufficient documentation

## 2016-07-12 DIAGNOSIS — Z91040 Latex allergy status: Secondary | ICD-10-CM | POA: Insufficient documentation

## 2016-07-12 DIAGNOSIS — G629 Polyneuropathy, unspecified: Secondary | ICD-10-CM | POA: Insufficient documentation

## 2016-07-12 LAB — CBC WITH DIFFERENTIAL/PLATELET
Basophils Absolute: 0 10*3/uL (ref 0.0–0.1)
Basophils Relative: 0 %
EOS ABS: 0.2 10*3/uL (ref 0.0–0.7)
EOS PCT: 2 %
HCT: 39.2 % (ref 36.0–46.0)
Hemoglobin: 13.4 g/dL (ref 12.0–15.0)
Lymphocytes Relative: 29 %
Lymphs Abs: 3 10*3/uL (ref 0.7–4.0)
MCH: 33.4 pg (ref 26.0–34.0)
MCHC: 34.2 g/dL (ref 30.0–36.0)
MCV: 97.8 fL (ref 78.0–100.0)
Monocytes Absolute: 1 10*3/uL (ref 0.1–1.0)
Monocytes Relative: 9 %
Neutro Abs: 6 10*3/uL (ref 1.7–7.7)
Neutrophils Relative %: 60 %
PLATELETS: 291 10*3/uL (ref 150–400)
RBC: 4.01 MIL/uL (ref 3.87–5.11)
RDW: 12.7 % (ref 11.5–15.5)
WBC: 10.2 10*3/uL (ref 4.0–10.5)

## 2016-07-12 LAB — COMPREHENSIVE METABOLIC PANEL
ALT: 22 U/L (ref 14–54)
AST: 30 U/L (ref 15–41)
Albumin: 4.2 g/dL (ref 3.5–5.0)
Alkaline Phosphatase: 59 U/L (ref 38–126)
Anion gap: 12 (ref 5–15)
BUN: 15 mg/dL (ref 6–20)
CO2: 24 mmol/L (ref 22–32)
CREATININE: 0.73 mg/dL (ref 0.44–1.00)
Calcium: 9.3 mg/dL (ref 8.9–10.3)
Chloride: 99 mmol/L — ABNORMAL LOW (ref 101–111)
GFR calc Af Amer: >60 mL/min (ref >60)
GFR calc non Af Amer: >60 mL/min (ref >60)
Glucose, Bld: 101 mg/dL — ABNORMAL HIGH (ref 65–99)
Potassium: 3.8 mmol/L (ref 3.5–5.1)
SODIUM: 135 mmol/L (ref 135–145)
Total Bilirubin: 0.3 mg/dL (ref 0.3–1.2)
Total Protein: 8 g/dL (ref 6.5–8.1)

## 2016-07-12 LAB — SEDIMENTATION RATE: Sed Rate: 8 mm/hr (ref 0–22)

## 2016-07-12 LAB — CK: Total CK: 216 U/L (ref 38–234)

## 2016-07-12 MED ORDER — GABAPENTIN 100 MG PO CAPS
100.0000 mg | ORAL_CAPSULE | Freq: Once | ORAL | Status: AC
  Administered 2016-07-12: 100 mg via ORAL
  Filled 2016-07-12: qty 1

## 2016-07-12 MED ORDER — LORAZEPAM 2 MG/ML IJ SOLN: 1.0000 mg | Freq: Once | INTRAMUSCULAR | Status: DC | PRN

## 2016-07-12 MED ORDER — GABAPENTIN 100 MG PO CAPS: 100.0000 mg | ORAL_CAPSULE | Freq: Three times a day (TID) | ORAL | 0 refills | Status: DC

## 2016-07-12 NOTE — ED Triage Notes (Signed)
Pt. Stated, My hands feel numb. I was here 2 weeks ago for some type of allergy. Now for 2 weeks my hands are numb.  Equal hand grips for both hands.

## 2016-07-12 NOTE — ED Notes (Signed)
Papers reviewed with patient and she verbalizes understanding

## 2016-07-12 NOTE — ED Notes (Signed)
Dr. Erin HearingMessner at bedside  Milk given

## 2016-07-12 NOTE — ED Provider Notes (Signed)
[facility] Provider Note   CSN: [id number] Arrival date & time: 07/12/16  1331  By signing my name below, I, Arianna Nassar, attest that this documentation has been prepared under the direction and in the presence of Marily Memos, MD.  Electronically Signed: Octavia Heir, ED Scribe. 07/12/16. 3:39 PM.    History   Chief Complaint Chief Complaint  Patient presents with   Hand Problem   The history is provided by the patient. No language interpreter was used.   HPI Comments: Lynn Black is a 56 y.o. female who has a PMhx of ezcema presents to the Emergency Department complaining of moderate, persistent, bilateral hand numbness x 2 weeks.She was seen on 01/14 for a rash and pain to her bilateral hands that was consistent with prior eczema flare-ups. Pt further expresses having tingling sensation in her bilateral hands and pins/needles in her fingers. She was given prednisone and kenalog cream for her rash which has since resolved. She has not followed up with her primary care doctor. Pt has not had similar symptoms in the past. Pt denies neck pain, trauma to neck, visual disturbances, fever, bilateral feet numbness, or speech difficulty.  Past Medical History:  Diagnosis Date   Eczema     There are no active problems to display for this patient.   Past Surgical History:  Procedure Laterality Date   ROTATOR CUFF REPAIR Right     OB History    No data available       Home Medications    Prior to Admission medications   Medication Sig Start Date End Date Taking? Authorizing Provider  gabapentin (NEURONTIN) 100 MG capsule Take 1 capsule (100 mg total) by mouth 3 (three) times daily. 07/12/16   Marily Memos, MD  ketoconazole (NIZORAL) 2 % cream Apply 1 application topically daily. 06/20/16   Georgiana Shore, PA-C  potassium chloride (K-DUR) 10 MEQ tablet Take 1 tablet (10 mEq total) by mouth daily. Patient not taking: Reported on 06/20/2016 12/06/14 12/20/14  Nira Conn, MD  predniSONE (STERAPRED UNI-PAK 21 TAB) 10 MG (21) TBPK tablet Take 1 tablet (10 mg total) by mouth daily. Take 6 tabs by mouth daily  for 2 days, then 5 tabs for 2 days, then 4 tabs for 2 days, then 3 tabs for 2 days, 2 tabs for 2 days, then 1 tab by mouth daily for 2 days 06/20/16   Georgiana Shore, PA-C  promethazine (PHENERGAN) 25 MG tablet Take 1 tablet (25 mg total) by mouth every 6 (six) hours as needed for nausea or vomiting. Patient not taking: Reported on 06/20/2016 12/07/14   Charlestine Night, PA-C  ranitidine (ZANTAC) 150 MG capsule Take 1 capsule (150 mg total) by mouth daily. Patient not taking: Reported on 06/20/2016 12/06/14   Nira Conn, MD  triamcinolone cream (KENALOG) 0.1 % Apply 1 application topically 2 (two) times daily. 06/20/16   Georgiana Shore, PA-C    Family History No family history on file.  Social History Social History  Substance Use Topics   Smoking status: Current Every Day Smoker    Packs/day: 0.50    Types: Cigarettes   Smokeless tobacco: Never Used   Alcohol use Yes     Comment: 2 beers per day     Allergies   Nickel; Other; Sulfa antibiotics; and Latex   Review of Systems Review of Systems  Constitutional: Negative for fever.  Eyes: Negative for visual disturbance.  Musculoskeletal: Negative for neck pain.  Neurological: Positive for numbness. Negative for speech difficulty.  All other systems reviewed and are negative.    Physical Exam Updated Vital Signs BP 147/82 (BP Location: Right Arm)    Pulse 98    Temp 97.8 F (36.6 C) (Oral)    Resp 16    Ht 5\' 2"  (1.575 m)    Wt 117 lb (53.1 kg)    SpO2 97%    BMI 21.40 kg/m   Physical Exam  Constitutional: She is oriented to person, place, and time. She appears well-developed and well-nourished.  HENT:  Head: Normocephalic.  Eyes: EOM are normal.  Neck: Normal range of motion.  Pulmonary/Chest: Effort normal.  Abdominal: She exhibits no distension.   Musculoskeletal: Normal range of motion.  Neurological: She is alert and oriented to person, place, and time. No cranial nerve deficit.  Good grip but decreased strength of flexion and extention at elbow, decreased sensation to hands of both sides of all distributions to wrist of the dorsal and volar aspect, normal cap refill, normal brachioradialis and bicep reflexes on both sides, color is normal, normal stength in lower extremities, no cranial nerve deficits.  Psychiatric: She has a normal mood and affect.  Nursing note and vitals reviewed.    ED Treatments / Results  DIAGNOSTIC STUDIES: Oxygen Saturation is 98% on RA, normal by my interpretation.  COORDINATION OF CARE:  3:39 PM Discussed treatment plan with pt at bedside and pt agreed to plan.  Labs (all labs ordered are listed, but only abnormal results are displayed) Labs Reviewed  COMPREHENSIVE METABOLIC PANEL - Abnormal; Notable for the following:       Result Value   Chloride 99 (*)    Glucose, Bld 101 (*)    All other components within normal limits  CBC WITH DIFFERENTIAL/PLATELET  CK  SEDIMENTATION RATE    EKG  EKG Interpretation None       Radiology No results found.  Procedures Procedures (including critical care time)  Medications Ordered in ED Medications  LORazepam (ATIVAN) injection 1 mg (not administered)  gabapentin (NEURONTIN) capsule 100 mg (100 mg Oral Given 07/12/16 1717)     Initial Impression / Assessment and Plan / ED Course  I have reviewed the triage vital signs and the nursing notes.  Pertinent labs & imaging results that were available during my care of the patient were reviewed by me and considered in my medical decision making (see chart for details).     Likely neuralgia of some sort? Possibly post-infectious, inflammatory? Discussed as curbside with neurology who recommended close neuro follow up for EMG, neurontin and ER MRI. Patient rpeferred to defer MRI until seeing  neurologist. As it didn't seem to be an emergent MRI but rather urgent in nature, low likelihood of finding neuro emergency, she was discharged with information/ambulatory referral to follow up with [facility].   Final Clinical Impressions(s) / ED Diagnoses   Final diagnoses:  Neuropathy [facility](HCC)    New Prescriptions Discharge Medication List as of 07/12/2016  5:24 PM    START taking these medications   Details  gabapentin (NEURONTIN) 100 MG capsule Take 1 capsule (100 mg total) by mouth 3 (three) times daily., Starting Sun 07/12/2016, Print       I personally performed the services described in this documentation, which was scribed in my presence. The recorded information has been reviewed and is accurate.     Marily MemosJason Andra Matsuo, MD 07/12/16 [phone]

## 2016-07-26 ENCOUNTER — Emergency Department (HOSPITAL_COMMUNITY): Payer: Self-pay

## 2016-07-26 ENCOUNTER — Encounter (HOSPITAL_COMMUNITY): Payer: Self-pay | Admitting: *Deleted

## 2016-07-26 ENCOUNTER — Emergency Department (HOSPITAL_COMMUNITY)
Admission: EM | Admit: 2016-07-26 | Discharge: 2016-07-26 | Disposition: A | Discharge status: Home / Self Care | Payer: Self-pay | Attending: Emergency Medicine | Admitting: Emergency Medicine

## 2016-07-26 DIAGNOSIS — R21 Rash and other nonspecific skin eruption: Secondary | ICD-10-CM | POA: Insufficient documentation

## 2016-07-26 DIAGNOSIS — M4802 Spinal stenosis, cervical region: Secondary | ICD-10-CM | POA: Insufficient documentation

## 2016-07-26 DIAGNOSIS — F17210 Nicotine dependence, cigarettes, uncomplicated: Secondary | ICD-10-CM | POA: Insufficient documentation

## 2016-07-26 DIAGNOSIS — Z79899 Other long term (current) drug therapy: Secondary | ICD-10-CM | POA: Insufficient documentation

## 2016-07-26 LAB — I-STAT CHEM 8, ED
BUN: 20 mg/dL (ref 6–20)
CALCIUM ION: 1.14 mmol/L — AB (ref 1.15–1.40)
CREATININE: 0.7 mg/dL (ref 0.44–1.00)
Chloride: 99 mmol/L — ABNORMAL LOW (ref 101–111)
GLUCOSE: 110 mg/dL — AB (ref 65–99)
HCT: 39 % (ref 36.0–46.0)
HEMOGLOBIN: 13.3 g/dL (ref 12.0–15.0)
Potassium: 4.3 mmol/L (ref 3.5–5.1)
Sodium: 133 mmol/L — ABNORMAL LOW (ref 135–145)
TCO2: 27 mmol/L (ref 0–100)

## 2016-07-26 LAB — TSH: TSH: 1.585 u[IU]/mL (ref 0.350–4.500)

## 2016-07-26 MED ORDER — PREDNISONE 10 MG (21) PO TBPK: 10.0000 mg | ORAL_TABLET | Freq: Every day | ORAL | 0 refills | Status: DC

## 2016-07-26 MED ORDER — TRIAMCINOLONE ACETONIDE 0.1 % EX CREA: 1.0000 "application " | TOPICAL_CREAM | Freq: Two times a day (BID) | CUTANEOUS | 0 refills | Status: DC

## 2016-07-26 NOTE — ED Triage Notes (Signed)
Pt reports numbness sensation to bilateral hands, was here two weeks ago for same. Was told she needs MRI but unable to get it scheduled. No neuro deficits noted at triage.

## 2016-07-26 NOTE — ED Provider Notes (Signed)
I assumed care of this patient from [employer]ana Kirichenko, PA-C at 1500.  Please see their note for further details of Hx, PE.  Briefly patient is a 56 y.o. female with a Numbness  that is concerning for cervical stenosis.  Current plan is to follow up MRI.  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 MRI brain unremarkable.  Pt already has [facility] who performed her previous surgery. She would like to follow up with them.   Pt also complaining of rash which she was seen here previously, reports that it improved with predisone and cream. Will Rx this again and have her follow up with PCP.    Disposition: Discharge  Condition: Good  I have discussed the results, Dx and Tx plan with the patient who expressed understanding and agree(s) with the plan. Discharge instructions discussed at great length. The patient was given strict return precautions who verbalized understanding of the instructions. No further questions at time of discharge.    Current Discharge Medication List      Follow Up: Trey SailorsMark Roy, MD Address: [address], [place], [place] [address] Phone: [phone] Schedule an appointment as soon as possible for a visit  for cervical stenosis      Nira ConnPedro Eduardo Kiyona Mcnall, MD 07/26/16 1755

## 2016-07-26 NOTE — ED Provider Notes (Signed)
[facility] DEPT Provider Note   CSN: [id number] Arrival date & time: 07/26/16  1150     History   Chief Complaint Chief Complaint  Patient presents with   Numbness    HPI Lynn Black is a 56 y.o. female.  HPI Lynn Black is a 56 y.o. female presents to emergency department complaining of bilateral hand numbness. Patient states her symptoms began approximately 5 weeks ago. States onset of numbness is gradual. She states at this point she cannot feel anything that she touches or holds. She denies any neck pain. She states numbness sometimes radiates up her arms. Denies any pain. Denies any weakness. Patient does work with her hands. She states numbness is both over palmar and dorsal surfaces of her hands. She was seen in emergency department several days ago and was given a referral to neurology, states unable to go because he does not have insurance at this time. She was given prescription for Neurontin which she is taking with no improvement in her symptoms.    Past Medical History:  Diagnosis Date   Eczema     There are no active problems to display for this patient.   Past Surgical History:  Procedure Laterality Date   ROTATOR CUFF REPAIR Right     OB History    No data available       Home Medications    Prior to Admission medications   Medication Sig Start Date End Date Taking? Authorizing Provider  gabapentin (NEURONTIN) 100 MG capsule Take 1 capsule (100 mg total) by mouth 3 (three) times daily. 07/12/16   Marily Memos, MD  ketoconazole (NIZORAL) 2 % cream Apply 1 application topically daily. 06/20/16   Georgiana Shore, PA-C  potassium chloride (K-DUR) 10 MEQ tablet Take 1 tablet (10 mEq total) by mouth daily. Patient not taking: Reported on 06/20/2016 12/06/14 12/20/14  Nira Conn, MD  predniSONE (STERAPRED UNI-PAK 21 TAB) 10 MG (21) TBPK tablet Take 1 tablet (10 mg total) by mouth daily. Take 6 tabs by mouth daily  for 2 days, then 5 tabs  for 2 days, then 4 tabs for 2 days, then 3 tabs for 2 days, 2 tabs for 2 days, then 1 tab by mouth daily for 2 days 06/20/16   Georgiana Shore, PA-C  promethazine (PHENERGAN) 25 MG tablet Take 1 tablet (25 mg total) by mouth every 6 (six) hours as needed for nausea or vomiting. Patient not taking: Reported on 06/20/2016 12/07/14   Charlestine Night, PA-C  ranitidine (ZANTAC) 150 MG capsule Take 1 capsule (150 mg total) by mouth daily. Patient not taking: Reported on 06/20/2016 12/06/14   Nira Conn, MD  triamcinolone cream (KENALOG) 0.1 % Apply 1 application topically 2 (two) times daily. 06/20/16   Georgiana Shore, PA-C    Family History History reviewed. No pertinent family history.  Social History Social History  Substance Use Topics   Smoking status: Current Every Day Smoker    Packs/day: 0.50    Types: Cigarettes   Smokeless tobacco: Never Used   Alcohol use Yes     Comment: 2 beers per day     Allergies   Nickel; Other; Sulfa antibiotics; and Latex   Review of Systems Review of Systems  Constitutional: Negative for chills and fever.  Respiratory: Negative for cough, chest tightness and shortness of breath.   Cardiovascular: Negative for chest pain, palpitations and leg swelling.  Musculoskeletal: Negative for neck pain and neck stiffness.  Skin:  Negative for rash.  Neurological: Positive for numbness. Negative for dizziness, weakness and headaches.  All other systems reviewed and are negative.    Physical Exam Updated Vital Signs BP 163/93 (BP Location: Left Arm)    Pulse 100    Temp 99 F (37.2 C) (Oral)    Resp 18    SpO2 98%   Physical Exam  Constitutional: She is oriented to person, place, and time. She appears well-developed and well-nourished. No distress.  HENT:  Head: Normocephalic and atraumatic.  Eyes: Conjunctivae and EOM are normal. Pupils are equal, round, and reactive to light.  Neck: Normal range of motion. Neck supple.   Cardiovascular: Normal rate, regular rhythm and normal heart sounds.   Pulmonary/Chest: Effort normal and breath sounds normal. No respiratory distress. She has no wheezes. She has no rales.  Musculoskeletal: Normal range of motion. She exhibits no edema.  Neurological: She is alert and oriented to person, place, and time. No cranial nerve deficit. Coordination normal.  Full range of motion of all fingers and bilateral wrists. Grip strength is 5 out of 5 and equal bilaterally. Patient is able to do "thumbs-up", cross second and third digits, post her thumb to every fingertip. Distal radial pulses intact. Complete absence of sensation to soft and sharp touch over bilateral palmar and dorsal surfaces of the hands, distal to the wrist. Sensation intact in all dermatomes proximal to the wrist. Biceps reflexes present. Capillary refill less than 2 seconds distally.  Skin: Skin is warm and dry.  Dry, erythematous rash to bilateral palms of the hands, forearms, lower legs.  Psychiatric: She has a normal mood and affect. Her behavior is normal.  Nursing note and vitals reviewed.    ED Treatments / Results  Labs (all labs ordered are listed, but only abnormal results are displayed) Labs Reviewed  I-STAT CHEM 8, ED    EKG  EKG Interpretation None       Radiology No results found.  Procedures Procedures (including critical care time)  Medications Ordered in ED Medications - No data to display   Initial Impression / Assessment and Plan / ED Course  I have reviewed the triage vital signs and the nursing notes.  Pertinent labs & imaging results that were available during my care of the patient were reviewed by me and considered in my medical decision making (see chart for details).    Pt with bilateral hand numbness. Numbness to both sharp and soft touch. Strength intact. Cap refill <2 sec. Will get MR head and cervical spine.   Pt signed out to Dr. Eudelia Bunchardama at shift change pending  MRI. Discussed with Dr. Erma HeritageISaacs, will add TSH.     Final Clinical Impressions(s) / ED Diagnoses   Final diagnoses:  None    New Prescriptions New Prescriptions   No medications on file     Jaynie Crumbleatyana Montana Fassnacht, PA-C 07/27/16 1635    Shaune Pollackameron Isaacs, MD 07/27/16 1921

## 2016-07-26 NOTE — ED Notes (Signed)
Pt transported to MRI °

## 2016-07-26 NOTE — ED Notes (Addendum)
Patient transported to MRI

## 2016-10-18 ENCOUNTER — Encounter (HOSPITAL_COMMUNITY): Payer: Self-pay | Admitting: Vascular Surgery

## 2016-10-18 ENCOUNTER — Emergency Department (HOSPITAL_COMMUNITY)
Admission: EM | Admit: 2016-10-18 | Discharge: 2016-10-18 | Disposition: A | Discharge status: Home / Self Care | Payer: Medicaid Other | Attending: Emergency Medicine | Admitting: Emergency Medicine

## 2016-10-18 DIAGNOSIS — F17210 Nicotine dependence, cigarettes, uncomplicated: Secondary | ICD-10-CM | POA: Insufficient documentation

## 2016-10-18 DIAGNOSIS — M542 Cervicalgia: Secondary | ICD-10-CM | POA: Diagnosis present

## 2016-10-18 DIAGNOSIS — Z91040 Latex allergy status: Secondary | ICD-10-CM | POA: Insufficient documentation

## 2016-10-18 DIAGNOSIS — Z79899 Other long term (current) drug therapy: Secondary | ICD-10-CM | POA: Diagnosis not present

## 2016-10-18 DIAGNOSIS — G6289 Other specified polyneuropathies: Secondary | ICD-10-CM | POA: Diagnosis not present

## 2016-10-18 LAB — COMPREHENSIVE METABOLIC PANEL
ALT: 28 U/L (ref 14–54)
AST: 41 U/L (ref 15–41)
Albumin: 4.2 g/dL (ref 3.5–5.0)
Alkaline Phosphatase: 76 U/L (ref 38–126)
Anion gap: 16 — ABNORMAL HIGH (ref 5–15)
BUN: 6 mg/dL (ref 6–20)
CO2: 16 mmol/L — ABNORMAL LOW (ref 22–32)
Calcium: 9.1 mg/dL (ref 8.9–10.3)
Chloride: 98 mmol/L — ABNORMAL LOW (ref 101–111)
Creatinine, Ser: 0.78 mg/dL (ref 0.44–1.00)
GFR calc Af Amer: >60 mL/min (ref >60)
GFR calc non Af Amer: >60 mL/min (ref >60)
Glucose, Bld: 56 mg/dL — ABNORMAL LOW (ref 65–99)
Potassium: 4.2 mmol/L (ref 3.5–5.1)
Sodium: 130 mmol/L — ABNORMAL LOW (ref 135–145)
Total Bilirubin: 0.4 mg/dL (ref 0.3–1.2)
Total Protein: 7.8 g/dL (ref 6.5–8.1)

## 2016-10-18 LAB — CBC WITH DIFFERENTIAL/PLATELET
Basophils Absolute: 0.1 10*3/uL (ref 0.0–0.1)
Basophils Relative: 1 %
Eosinophils Absolute: 0.3 10*3/uL (ref 0.0–0.7)
Eosinophils Relative: 2 %
HCT: 46.3 % — ABNORMAL HIGH (ref 36.0–46.0)
Hemoglobin: 16.2 g/dL — ABNORMAL HIGH (ref 12.0–15.0)
Lymphocytes Relative: 34 %
Lymphs Abs: 3.8 10*3/uL (ref 0.7–4.0)
MCH: 35.8 pg — ABNORMAL HIGH (ref 26.0–34.0)
MCHC: 35 g/dL (ref 30.0–36.0)
MCV: 102.2 fL — ABNORMAL HIGH (ref 78.0–100.0)
Monocytes Absolute: 0.7 10*3/uL (ref 0.1–1.0)
Monocytes Relative: 6 %
Neutro Abs: 6.3 10*3/uL (ref 1.7–7.7)
Neutrophils Relative %: 57 %
Platelets: 283 10*3/uL (ref 150–400)
RBC: 4.53 MIL/uL (ref 3.87–5.11)
RDW: 15.8 % — ABNORMAL HIGH (ref 11.5–15.5)
WBC: 11.2 10*3/uL — ABNORMAL HIGH (ref 4.0–10.5)

## 2016-10-18 MED ORDER — GABAPENTIN 100 MG PO CAPS: 100.0000 mg | ORAL_CAPSULE | Freq: Three times a day (TID) | ORAL | 0 refills | Status: DC

## 2016-10-18 MED ORDER — ADULT MULTIVITAMIN W/MINERALS CH
1.0000 | ORAL_TABLET | Freq: Every day | ORAL | Status: DC
  Administered 2016-10-18: 1 via ORAL
  Filled 2016-10-18: qty 1

## 2016-10-18 MED ORDER — FOLIC ACID 1 MG PO TABS
1.0000 mg | ORAL_TABLET | Freq: Every day | ORAL | Status: DC
  Administered 2016-10-18: 1 mg via ORAL
  Filled 2016-10-18: qty 1

## 2016-10-18 MED ORDER — GABAPENTIN 100 MG PO CAPS
100.0000 mg | ORAL_CAPSULE | Freq: Once | ORAL | Status: AC
  Administered 2016-10-18: 100 mg via ORAL
  Filled 2016-10-18: qty 1

## 2016-10-18 MED ORDER — VITAMIN B-1 100 MG PO TABS
100.0000 mg | ORAL_TABLET | Freq: Once | ORAL | Status: AC
  Administered 2016-10-18: 100 mg via ORAL
  Filled 2016-10-18: qty 1

## 2016-10-18 NOTE — ED Triage Notes (Signed)
Pt reports to drink  2-3 beers a day for 3 years.

## 2016-10-18 NOTE — ED Provider Notes (Signed)
[facility] Provider Note   CSN: [id number] Arrival date & time: 10/18/16  1652  By signing my name below, I, Rosana Fret, attest that this documentation has been prepared under the direction and in the presence of Raeford Razor, MD. Electronically Signed: Rosana Fret, ED Scribe. 10/18/16. 6:21 PM. History   Chief Complaint Chief Complaint  Patient presents with   Neck Pain    The history is provided by the patient and a friend. No language interpreter was used.   HPI Comments: Lynn Black is a 56 y.o. female who presents to the Emergency Department complaining of persistent, gradually worsening right lower leg numbness that began about 3 weeks ago. Per pt she is experiencing numbness in her bilateral hands and forearms that has been ongoing for the past 3+ months.  She has difficulty with ambulation as a result of the numbness in her bilateral feet along with weakness in her legs and feelings of being off-balance. She has associated neck pain and has previously been told she has a pinched nerve; she believes this is causing her symptoms. Per pt, she has a SHx of EtOH use (2-3 drinks daily for 3 years). Pt denies hx of DM.   No PCP.    Past Medical History:  Diagnosis Date   Eczema     There are no active problems to display for this patient.   Past Surgical History:  Procedure Laterality Date   ROTATOR CUFF REPAIR Right     OB History    No data available       Home Medications    Prior to Admission medications   Medication Sig Start Date End Date Taking? Authorizing Provider  gabapentin (NEURONTIN) 100 MG capsule Take 1 capsule (100 mg total) by mouth 3 (three) times daily. 07/12/16   Mesner, Barbara Cower, MD  ketoconazole (NIZORAL) 2 % cream Apply 1 application topically daily. Patient not taking: Reported on 07/26/2016 06/20/16   Mathews Robinsons B, PA-C  potassium chloride (K-DUR) 10 MEQ tablet Take 1 tablet (10 mEq total) by mouth daily. Patient not  taking: Reported on 06/20/2016 12/06/14 12/20/14  Nira Conn, MD  predniSONE (STERAPRED UNI-PAK 21 TAB) 10 MG (21) TBPK tablet Take 1 tablet (10 mg total) by mouth daily. Take 6 tabs by mouth daily  for 2 days, then 5 tabs for 2 days, then 4 tabs for 2 days, then 3 tabs for 2 days, 2 tabs for 2 days, then 1 tab by mouth daily for 2 days 07/26/16   Cardama, Amadeo Garnet, MD  promethazine (PHENERGAN) 25 MG tablet Take 1 tablet (25 mg total) by mouth every 6 (six) hours as needed for nausea or vomiting. Patient not taking: Reported on 06/20/2016 12/07/14   Charlestine Night, PA-C  ranitidine (ZANTAC) 150 MG capsule Take 1 capsule (150 mg total) by mouth daily. Patient not taking: Reported on 06/20/2016 12/06/14   Nira Conn, MD  triamcinolone cream (KENALOG) 0.1 % Apply 1 application topically 2 (two) times daily. 07/26/16   Nira Conn, MD    Family History No family history on file.  Social History Social History  Substance Use Topics   Smoking status: Current Every Day Smoker    Packs/day: 0.50    Types: Cigarettes   Smokeless tobacco: Never Used   Alcohol use Yes     Comment: 2 beers per day     Allergies   Nickel; Shellfish allergy; Sulfa antibiotics; Other; and Latex   Review of Systems Review  of Systems  Musculoskeletal: Positive for gait problem and neck pain.  Neurological: Positive for weakness and numbness.  All other systems reviewed and are negative.    Physical Exam Updated Vital Signs BP 132/69 (BP Location: Right Arm)    Pulse (!) 115    Temp 98.4 F (36.9 C) (Oral)    Resp 18    SpO2 97%   Physical Exam  Constitutional: She is oriented to person, place, and time. She appears well-developed and well-nourished.  Somewhat disheveled appearance.   HENT:  Head: Normocephalic and atraumatic.  Neck: Normal range of motion. No tracheal deviation present.  Cardiovascular: Normal rate, regular rhythm and normal heart sounds.    Pulmonary/Chest: Effort normal and breath sounds normal. No respiratory distress. She has no wheezes. She has no rales.  Musculoskeletal: She exhibits no tenderness or deformity.  Neurological: She is alert and oriented to person, place, and time.  Globally weak.   Skin: Skin is warm and dry.  Nursing note and vitals reviewed.    ED Treatments / Results  DIAGNOSTIC STUDIES: Oxygen Saturation is 97% on RA, normal by my interpretation.   COORDINATION OF CARE: 6:02 PM-Discussed next steps with pt including decrease of EtOH use, eating a healthy, balance diet, and multi-Vitamin use. Pt verbalized understanding and is agreeable with the plan.   Labs (all labs ordered are listed, but only abnormal results are displayed) Labs Reviewed  COMPREHENSIVE METABOLIC PANEL - Abnormal; Notable for the following:       Result Value   Sodium 130 (*)    Chloride 98 (*)    CO2 16 (*)    Glucose, Bld 56 (*)    Anion gap 16 (*)    All other components within normal limits  CBC WITH DIFFERENTIAL/PLATELET - Abnormal; Notable for the following:    WBC 11.2 (*)    Hemoglobin 16.2 (*)    HCT 46.3 (*)    MCV 102.2 (*)    MCH 35.8 (*)    RDW 15.8 (*)    All other components within normal limits    EKG  EKG Interpretation None       Radiology No results found.  Procedures Procedures (including critical care time)  Medications Ordered in ED Medications - No data to display   Initial Impression / Assessment and Plan / ED Course  I have reviewed the triage vital signs and the nursing notes.  Pertinent labs & imaging results that were available during my care of the patient were reviewed by me and considered in my medical decision making (see chart for details).     56yF with symptoms of neuropathy. Pt initially upset with me from broaching this, but I suspect her symptoms are probably from long standing alcohol abuse. This is not simply from a "pinched nerve." Advised to stop  drinking. Diet/nutrition discussed. May possibly get some improvement in pain from gabapentin. Metabolic acidosis. 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 Glucose in 50s. Afebrile.   Final Clinical Impressions(s) / ED Diagnoses   Final diagnoses:  Other polyneuropathy    New Prescriptions New Prescriptions   No medications on file  I personally preformed the services scribed in my presence. The recorded information has been reviewed is accurate. Raeford RazorStephen Alexei Ey, MD.     Raeford RazorKohut, Uchechi Denison, MD 10/19/16 [phone]

## 2016-10-18 NOTE — ED Triage Notes (Signed)
Pt reports to the ED via [facility] for eval of numbness in all of her extremities and severe pain in her neck. She has hx of pinched nerve in her neck and she states that she was dx in Feb with this and states that she has been getting worse. She was referred to a neurologist but she has to cancel her appt because she cannot afford to see the neurologist. She was hypertensive and tachycardic en route. Denies any injury to her neck.

## 2016-10-18 NOTE — ED Triage Notes (Signed)
Pt . Here for a fix of pain  In neck. PT has an appt  With a neuro . DR  But cannot afford  The payment of office visit. Pt has friend in room that wants the Neuro DR to make an office visit  in the ED.

## 2016-10-18 NOTE — ED Triage Notes (Signed)
PT reports she can not  Feed herself because of numbness in arms. Pt reports 3 weeks ago the RT leg numbness and can not walk.

## 2016-10-21 ENCOUNTER — Other Ambulatory Visit: Payer: Self-pay | Admitting: Neurological Surgery

## 2016-10-21 DIAGNOSIS — M4712 Other spondylosis with myelopathy, cervical region: Secondary | ICD-10-CM | POA: Diagnosis not present

## 2016-10-22 ENCOUNTER — Encounter (HOSPITAL_COMMUNITY): Payer: Self-pay | Admitting: *Deleted

## 2016-10-26 ENCOUNTER — Ambulatory Visit (HOSPITAL_COMMUNITY): Payer: Medicaid Other | Admitting: Certified Registered Nurse Anesthetist

## 2016-10-26 ENCOUNTER — Ambulatory Visit (HOSPITAL_COMMUNITY): Payer: Medicaid Other

## 2016-10-26 ENCOUNTER — Ambulatory Visit (HOSPITAL_COMMUNITY)
Admission: RE | Disposition: A | Discharge status: Home / Self Care | Payer: Self-pay | Source: Ambulatory Visit | Attending: Neurological Surgery

## 2016-10-26 ENCOUNTER — Observation Stay (HOSPITAL_COMMUNITY)
Admission: RE | Admit: 2016-10-26 | Discharge: 2016-10-27 | Disposition: A | Discharge status: Home / Self Care | Payer: Medicaid Other | Source: Ambulatory Visit | Attending: Neurological Surgery | Admitting: Neurological Surgery

## 2016-10-26 ENCOUNTER — Encounter (HOSPITAL_COMMUNITY): Payer: Self-pay | Admitting: Certified Registered Nurse Anesthetist

## 2016-10-26 DIAGNOSIS — Z419 Encounter for procedure for purposes other than remedying health state, unspecified: Secondary | ICD-10-CM

## 2016-10-26 DIAGNOSIS — M4802 Spinal stenosis, cervical region: Secondary | ICD-10-CM | POA: Insufficient documentation

## 2016-10-26 DIAGNOSIS — Z981 Arthrodesis status: Secondary | ICD-10-CM | POA: Insufficient documentation

## 2016-10-26 DIAGNOSIS — F17200 Nicotine dependence, unspecified, uncomplicated: Secondary | ICD-10-CM | POA: Diagnosis not present

## 2016-10-26 DIAGNOSIS — M4722 Other spondylosis with radiculopathy, cervical region: Secondary | ICD-10-CM | POA: Diagnosis present

## 2016-10-26 DIAGNOSIS — Z882 Allergy status to sulfonamides status: Secondary | ICD-10-CM | POA: Diagnosis not present

## 2016-10-26 DIAGNOSIS — M2578 Osteophyte, vertebrae: Secondary | ICD-10-CM | POA: Diagnosis not present

## 2016-10-26 DIAGNOSIS — M4712 Other spondylosis with myelopathy, cervical region: Secondary | ICD-10-CM | POA: Diagnosis present

## 2016-10-26 HISTORY — PX: ANTERIOR CERVICAL DECOMP/DISCECTOMY FUSION: SHX1161

## 2016-10-26 HISTORY — DX: Cardiac murmur, unspecified: R01.1

## 2016-10-26 LAB — ABO/RH: ABO/RH(D): A NEG

## 2016-10-26 LAB — TYPE AND SCREEN
ABO/RH(D): A NEG
ANTIBODY SCREEN: NEGATIVE

## 2016-10-26 LAB — SURGICAL PCR SCREEN
MRSA, PCR: NEGATIVE
STAPHYLOCOCCUS AUREUS: POSITIVE — AB

## 2016-10-26 SURGERY — ANTERIOR CERVICAL DECOMPRESSION/DISCECTOMY FUSION 2 LEVELS
Anesthesia: General | Site: Neck

## 2016-10-26 MED ORDER — CEFAZOLIN SODIUM-DEXTROSE 2-4 GM/100ML-% IV SOLN
2.0000 g | INTRAVENOUS | Status: AC
  Administered 2016-10-26: 2 g via INTRAVENOUS
  Filled 2016-10-26: qty 100

## 2016-10-26 MED ORDER — MENTHOL 3 MG MT LOZG: 1.0000 | LOZENGE | OROMUCOSAL | Status: DC | PRN

## 2016-10-26 MED ORDER — ACETAMINOPHEN 650 MG RE SUPP
650.0000 mg | RECTAL | Status: DC | PRN
Start: 2016-10-26 — End: 2016-10-27

## 2016-10-26 MED ORDER — HYDROMORPHONE HCL 1 MG/ML IJ SOLN: 0.5000 mg | INTRAMUSCULAR | Status: DC | PRN

## 2016-10-26 MED ORDER — SODIUM CHLORIDE 0.9 % IR SOLN
Status: DC | PRN
  Administered 2016-10-26: 500 mL

## 2016-10-26 MED ORDER — PROPOFOL 10 MG/ML IV BOLUS
INTRAVENOUS | Status: DC | PRN
  Administered 2016-10-26: 120 mg via INTRAVENOUS

## 2016-10-26 MED ORDER — LACTATED RINGERS IV SOLN
INTRAVENOUS | Status: DC
  Administered 2016-10-26 (×2): via INTRAVENOUS

## 2016-10-26 MED ORDER — SODIUM CHLORIDE 0.9% FLUSH: 3.0000 mL | INTRAVENOUS | Status: DC | PRN

## 2016-10-26 MED ORDER — HYDROMORPHONE HCL 1 MG/ML IJ SOLN
0.2500 mg | INTRAMUSCULAR | Status: DC | PRN
  Administered 2016-10-26 (×3): 0.5 mg via INTRAVENOUS

## 2016-10-26 MED ORDER — ACETAMINOPHEN 10 MG/ML IV SOLN
INTRAVENOUS | Status: DC | PRN
  Administered 2016-10-26: 1000 mg via INTRAVENOUS

## 2016-10-26 MED ORDER — SUGAMMADEX SODIUM 200 MG/2ML IV SOLN
INTRAVENOUS | Status: DC | PRN
  Administered 2016-10-26: 200 mg via INTRAVENOUS

## 2016-10-26 MED ORDER — ACETAMINOPHEN 325 MG PO TABS: 650.0000 mg | ORAL_TABLET | ORAL | Status: DC | PRN

## 2016-10-26 MED ORDER — ROCURONIUM BROMIDE 100 MG/10ML IV SOLN
INTRAVENOUS | Status: DC | PRN
  Administered 2016-10-26: 20 mg via INTRAVENOUS
  Administered 2016-10-26: 10 mg via INTRAVENOUS
  Administered 2016-10-26: 50 mg via INTRAVENOUS

## 2016-10-26 MED ORDER — HEMOSTATIC AGENTS (NO CHARGE) OPTIME
TOPICAL | Status: DC | PRN
  Administered 2016-10-26 (×2): 1 via TOPICAL

## 2016-10-26 MED ORDER — LIDOCAINE-EPINEPHRINE 1 %-1:100000 IJ SOLN
INTRAMUSCULAR | Status: DC | PRN
  Administered 2016-10-26: 3 mL

## 2016-10-26 MED ORDER — LIDOCAINE-EPINEPHRINE 1 %-1:100000 IJ SOLN
INTRAMUSCULAR | Status: AC
  Filled 2016-10-26: qty 1

## 2016-10-26 MED ORDER — ONDANSETRON HCL 4 MG/2ML IJ SOLN: 4.0000 mg | Freq: Once | INTRAMUSCULAR | Status: DC | PRN

## 2016-10-26 MED ORDER — ALUM & MAG HYDROXIDE-SIMETH 200-200-20 MG/5ML PO SUSP: 30.0000 mL | Freq: Four times a day (QID) | ORAL | Status: DC | PRN

## 2016-10-26 MED ORDER — ROCURONIUM BROMIDE 10 MG/ML (PF) SYRINGE
PREFILLED_SYRINGE | INTRAVENOUS | Status: AC
  Filled 2016-10-26: qty 5

## 2016-10-26 MED ORDER — MIDAZOLAM HCL 5 MG/5ML IJ SOLN
INTRAMUSCULAR | Status: DC | PRN
  Administered 2016-10-26: 2 mg via INTRAVENOUS

## 2016-10-26 MED ORDER — THROMBIN 5000 UNITS EX SOLR
OROMUCOSAL | Status: DC | PRN
  Administered 2016-10-26 (×3): 5 mL via TOPICAL

## 2016-10-26 MED ORDER — ONDANSETRON HCL 4 MG/2ML IJ SOLN
INTRAMUSCULAR | Status: DC | PRN
  Administered 2016-10-26: 4 mg via INTRAVENOUS

## 2016-10-26 MED ORDER — BUPIVACAINE HCL (PF) 0.5 % IJ SOLN
INTRAMUSCULAR | Status: DC | PRN
  Administered 2016-10-26: 3 mL

## 2016-10-26 MED ORDER — SODIUM CHLORIDE 0.9% FLUSH
3.0000 mL | Freq: Two times a day (BID) | INTRAVENOUS | Status: DC
  Administered 2016-10-26: 3 mL via INTRAVENOUS

## 2016-10-26 MED ORDER — DEXAMETHASONE SODIUM PHOSPHATE 10 MG/ML IJ SOLN
INTRAMUSCULAR | Status: AC
  Filled 2016-10-26: qty 1

## 2016-10-26 MED ORDER — PHENYLEPHRINE 40 MCG/ML (10ML) SYRINGE FOR IV PUSH (FOR BLOOD PRESSURE SUPPORT)
PREFILLED_SYRINGE | INTRAVENOUS | Status: AC
  Filled 2016-10-26: qty 10

## 2016-10-26 MED ORDER — FENTANYL CITRATE (PF) 250 MCG/5ML IJ SOLN
INTRAMUSCULAR | Status: AC
  Filled 2016-10-26: qty 5

## 2016-10-26 MED ORDER — ONDANSETRON HCL 4 MG PO TABS: 4.0000 mg | ORAL_TABLET | Freq: Four times a day (QID) | ORAL | Status: DC | PRN

## 2016-10-26 MED ORDER — LIDOCAINE 2% (20 MG/ML) 5 ML SYRINGE
INTRAMUSCULAR | Status: AC
  Filled 2016-10-26: qty 5

## 2016-10-26 MED ORDER — HYDROMORPHONE HCL 1 MG/ML IJ SOLN
INTRAMUSCULAR | Status: AC
  Filled 2016-10-26: qty 0.5

## 2016-10-26 MED ORDER — SENNA 8.6 MG PO TABS
1.0000 | ORAL_TABLET | Freq: Two times a day (BID) | ORAL | Status: DC
  Administered 2016-10-26 – 2016-10-27 (×2): 8.6 mg via ORAL
  Filled 2016-10-26 (×2): qty 1

## 2016-10-26 MED ORDER — DIPHENHYDRAMINE HCL 25 MG PO CAPS: 25.0000 mg | ORAL_CAPSULE | ORAL | Status: DC | PRN

## 2016-10-26 MED ORDER — GABAPENTIN 100 MG PO CAPS
100.0000 mg | ORAL_CAPSULE | Freq: Three times a day (TID) | ORAL | Status: DC
  Administered 2016-10-26 – 2016-10-27 (×3): 100 mg via ORAL
  Filled 2016-10-26 (×3): qty 1

## 2016-10-26 MED ORDER — METHOCARBAMOL 1000 MG/10ML IJ SOLN
500.0000 mg | Freq: Four times a day (QID) | INTRAVENOUS | Status: DC | PRN
  Filled 2016-10-26: qty 5

## 2016-10-26 MED ORDER — PHENOL 1.4 % MT LIQD: 1.0000 | OROMUCOSAL | Status: DC | PRN

## 2016-10-26 MED ORDER — ONDANSETRON HCL 4 MG/2ML IJ SOLN
INTRAMUSCULAR | Status: AC
  Filled 2016-10-26: qty 2

## 2016-10-26 MED ORDER — THROMBIN 5000 UNITS EX SOLR
CUTANEOUS | Status: AC
  Filled 2016-10-26: qty 5000

## 2016-10-26 MED ORDER — PHENYLEPHRINE HCL 10 MG/ML IJ SOLN
INTRAVENOUS | Status: DC | PRN
  Administered 2016-10-26: 25 ug/min via INTRAVENOUS

## 2016-10-26 MED ORDER — PHENYLEPHRINE HCL 10 MG/ML IJ SOLN
INTRAMUSCULAR | Status: DC | PRN
  Administered 2016-10-26: 40 ug via INTRAVENOUS
  Administered 2016-10-26: 80 ug via INTRAVENOUS

## 2016-10-26 MED ORDER — FLEET ENEMA 7-19 GM/118ML RE ENEM: 1.0000 | ENEMA | Freq: Once | RECTAL | Status: DC | PRN

## 2016-10-26 MED ORDER — MEPERIDINE HCL 25 MG/ML IJ SOLN: 6.2500 mg | INTRAMUSCULAR | Status: DC | PRN

## 2016-10-26 MED ORDER — CEFAZOLIN SODIUM-DEXTROSE 2-4 GM/100ML-% IV SOLN
2.0000 g | Freq: Three times a day (TID) | INTRAVENOUS | Status: AC
  Administered 2016-10-26 – 2016-10-27 (×2): 2 g via INTRAVENOUS
  Filled 2016-10-26 (×2): qty 100

## 2016-10-26 MED ORDER — BUPIVACAINE HCL (PF) 0.5 % IJ SOLN
INTRAMUSCULAR | Status: AC
  Filled 2016-10-26: qty 30

## 2016-10-26 MED ORDER — LIDOCAINE HCL (CARDIAC) 20 MG/ML IV SOLN
INTRAVENOUS | Status: DC | PRN
  Administered 2016-10-26: 100 mg via INTRAVENOUS

## 2016-10-26 MED ORDER — CHLORHEXIDINE GLUCONATE CLOTH 2 % EX PADS: 6.0000 | MEDICATED_PAD | Freq: Once | CUTANEOUS | Status: DC

## 2016-10-26 MED ORDER — HYDROCODONE-ACETAMINOPHEN 5-325 MG PO TABS
1.0000 | ORAL_TABLET | ORAL | Status: DC | PRN
  Administered 2016-10-26 – 2016-10-27 (×3): 2 via ORAL
  Filled 2016-10-26 (×3): qty 2

## 2016-10-26 MED ORDER — DOCUSATE SODIUM 100 MG PO CAPS
100.0000 mg | ORAL_CAPSULE | Freq: Two times a day (BID) | ORAL | Status: DC
  Administered 2016-10-26 – 2016-10-27 (×2): 100 mg via ORAL
  Filled 2016-10-26 (×2): qty 1

## 2016-10-26 MED ORDER — 0.9 % SODIUM CHLORIDE (POUR BTL) OPTIME
TOPICAL | Status: DC | PRN
  Administered 2016-10-26: 1000 mL

## 2016-10-26 MED ORDER — MIDAZOLAM HCL 2 MG/2ML IJ SOLN
INTRAMUSCULAR | Status: AC
  Filled 2016-10-26: qty 2

## 2016-10-26 MED ORDER — MUPIROCIN 2 % EX OINT
1.0000 "application " | TOPICAL_OINTMENT | Freq: Once | CUTANEOUS | Status: AC
  Administered 2016-10-26: 1 via TOPICAL
  Filled 2016-10-26: qty 22

## 2016-10-26 MED ORDER — METHOCARBAMOL 500 MG PO TABS
500.0000 mg | ORAL_TABLET | Freq: Four times a day (QID) | ORAL | Status: DC | PRN
  Administered 2016-10-27: 500 mg via ORAL
  Filled 2016-10-26: qty 1

## 2016-10-26 MED ORDER — FENTANYL CITRATE (PF) 100 MCG/2ML IJ SOLN
INTRAMUSCULAR | Status: DC | PRN
  Administered 2016-10-26: 100 ug via INTRAVENOUS
  Administered 2016-10-26 (×4): 50 ug via INTRAVENOUS

## 2016-10-26 MED ORDER — SUGAMMADEX SODIUM 200 MG/2ML IV SOLN
INTRAVENOUS | Status: AC
  Filled 2016-10-26: qty 2

## 2016-10-26 MED ORDER — BISACODYL 10 MG RE SUPP: 10.0000 mg | Freq: Every day | RECTAL | Status: DC | PRN

## 2016-10-26 MED ORDER — ALBUTEROL SULFATE HFA 108 (90 BASE) MCG/ACT IN AERS
INHALATION_SPRAY | RESPIRATORY_TRACT | Status: DC | PRN
  Administered 2016-10-26: 8 via RESPIRATORY_TRACT

## 2016-10-26 MED ORDER — DEXAMETHASONE SODIUM PHOSPHATE 10 MG/ML IJ SOLN
INTRAMUSCULAR | Status: DC | PRN
  Administered 2016-10-26: 10 mg via INTRAVENOUS

## 2016-10-26 MED ORDER — PROPOFOL 10 MG/ML IV BOLUS
INTRAVENOUS | Status: AC
  Filled 2016-10-26: qty 20

## 2016-10-26 MED ORDER — THROMBIN 5000 UNITS EX SOLR
CUTANEOUS | Status: AC
Start: 2016-10-26 — End: 2016-10-26
  Filled 2016-10-26: qty 10000

## 2016-10-26 MED ORDER — ALBUTEROL SULFATE HFA 108 (90 BASE) MCG/ACT IN AERS
INHALATION_SPRAY | RESPIRATORY_TRACT | Status: AC
  Filled 2016-10-26: qty 6.7

## 2016-10-26 MED ORDER — ONDANSETRON HCL 4 MG/2ML IJ SOLN: 4.0000 mg | Freq: Four times a day (QID) | INTRAMUSCULAR | Status: DC | PRN

## 2016-10-26 MED ORDER — POLYETHYLENE GLYCOL 3350 17 G PO PACK: 17.0000 g | PACK | Freq: Every day | ORAL | Status: DC | PRN

## 2016-10-26 SURGICAL SUPPLY — 55 items
ADH SKN CLS APL DERMABOND .7 (GAUZE/BANDAGES/DRESSINGS) ×1
ALLOGRAFT TRIAD LORDOTIC CC (Bone Implant) ×4 IMPLANT
BAG DECANTER FOR FLEXI CONT (MISCELLANEOUS) ×3 IMPLANT
BIT DRILL NEURO 2X3.1 SFT TUCH (MISCELLANEOUS) ×1 IMPLANT
BIT DRILL POWER (BIT) IMPLANT
BNDG GAUZE ELAST 4 BULKY (GAUZE/BANDAGES/DRESSINGS) IMPLANT
BUR BARREL STRAIGHT FLUTE 4.0 (BURR) ×3 IMPLANT
CANISTER SUCT 3000ML PPV (MISCELLANEOUS) ×3 IMPLANT
DECANTER SPIKE VIAL GLASS SM (MISCELLANEOUS) ×3 IMPLANT
DERMABOND ADVANCED (GAUZE/BANDAGES/DRESSINGS) ×2
DERMABOND ADVANCED .7 DNX12 (GAUZE/BANDAGES/DRESSINGS) ×1 IMPLANT
DRAPE LAPAROTOMY 100X72 PEDS (DRAPES) ×3 IMPLANT
DRAPE MICROSCOPE LEICA (MISCELLANEOUS) IMPLANT
DRAPE POUCH INSTRU U-SHP 10X18 (DRAPES) ×3 IMPLANT
DRILL BIT POWER (BIT) ×3
DRILL NEURO 2X3.1 SOFT TOUCH (MISCELLANEOUS) ×3
DURAPREP 6ML APPLICATOR 50/CS (WOUND CARE) ×3 IMPLANT
ELECT REM PT RETURN 9FT ADLT (ELECTROSURGICAL) ×3
ELECTRODE REM PT RTRN 9FT ADLT (ELECTROSURGICAL) ×1 IMPLANT
GAUZE SPONGE 4X4 12PLY STRL (GAUZE/BANDAGES/DRESSINGS) ×3 IMPLANT
GAUZE SPONGE 4X4 16PLY XRAY LF (GAUZE/BANDAGES/DRESSINGS) IMPLANT
GLOVE BIOGEL PI IND STRL 8.5 (GLOVE) ×1 IMPLANT
GLOVE BIOGEL PI INDICATOR 8.5 (GLOVE) ×2
GLOVE EXAM NITRILE LRG STRL (GLOVE) IMPLANT
GLOVE EXAM NITRILE XL STR (GLOVE) IMPLANT
GLOVE EXAM NITRILE XS STR PU (GLOVE) IMPLANT
GLOVE SURG SS PI 8.0 STRL IVOR (GLOVE) ×4 IMPLANT
GLOVE SURG SS PI 8.5 STRL IVOR (GLOVE) ×6
GLOVE SURG SS PI 8.5 STRL STRW (GLOVE) IMPLANT
GOWN STRL REUS W/ TWL LRG LVL3 (GOWN DISPOSABLE) IMPLANT
GOWN STRL REUS W/ TWL XL LVL3 (GOWN DISPOSABLE) IMPLANT
GOWN STRL REUS W/TWL 2XL LVL3 (GOWN DISPOSABLE) ×3 IMPLANT
GOWN STRL REUS W/TWL LRG LVL3 (GOWN DISPOSABLE) ×3
GOWN STRL REUS W/TWL XL LVL3 (GOWN DISPOSABLE) ×3
HALTER HD/CHIN CERV TRACTION D (MISCELLANEOUS) ×3 IMPLANT
HEMOSTAT POWDER KIT SURGIFOAM (HEMOSTASIS) ×3 IMPLANT
KIT BASIN OR (CUSTOM PROCEDURE TRAY) ×3 IMPLANT
KIT ROOM TURNOVER OR (KITS) ×3 IMPLANT
NDL SPNL 22GX3.5 QUINCKE BK (NEEDLE) ×1 IMPLANT
NEEDLE HYPO 22GX1.5 SAFETY (NEEDLE) ×3 IMPLANT
NEEDLE SPNL 22GX3.5 QUINCKE BK (NEEDLE) ×3 IMPLANT
NS IRRIG 1000ML POUR BTL (IV SOLUTION) ×3 IMPLANT
PACK LAMINECTOMY NEURO (CUSTOM PROCEDURE TRAY) ×3 IMPLANT
PAD ARMBOARD 7.5X6 YLW CONV (MISCELLANEOUS) ×9 IMPLANT
PATTIES SURGICAL .5 X1 (DISPOSABLE) ×2 IMPLANT
PLATE ARCHON 2-LEVEL 40MM (Plate) ×2 IMPLANT
RASP 3.0MM (RASP) ×2 IMPLANT
RUBBERBAND STERILE (MISCELLANEOUS) IMPLANT
SCREW ARCHON ST VAR 4.0X15MM (Screw) ×18 IMPLANT
SCREW BN 15X4XST VA NS SPN (Screw) IMPLANT
SPONGE INTESTINAL PEANUT (DISPOSABLE) ×3 IMPLANT
SUT VIC AB 4-0 RB1 18 (SUTURE) ×6 IMPLANT
TOWEL GREEN STERILE (TOWEL DISPOSABLE) ×3 IMPLANT
TOWEL GREEN STERILE FF (TOWEL DISPOSABLE) ×2 IMPLANT
WATER STERILE IRR 1000ML POUR (IV SOLUTION) ×3 IMPLANT

## 2016-10-26 NOTE — H&P (Signed)
Lynn Black presents to the office today with a chief complaint of numbness and tingling in the hands and difficulty with her gait that has been going on since February of this year.  HISTORY OF PRESENT ILLNESS: Lynn Black is a 56 year old, right-handed individual who tells me that years ago she had surgery by Dr. Trey SailorsMark Roy to decompress and stabilize C6-C7. She notes that she was doing well until rather spontaneously in mid February she noticed the onset of tingling and numbness into both her hands. She recalls no event that caused this to occur and notes that she did not have any neck pain. Gradually, her hands became more numb, and she also notes that she started developing difficulties with her balance and her stability on her feet. This process has continued. In the end of February, she had an MRI of the cervical spine that demonstrates that she has cord compression at C4-5 and also at C5-6. Because of severe financial constraints, she could not arrange this appointment until just the recent past, and she presents today noting that she can walk only very short distances, basically from bedroom to bathroom and around her house, and has marked instability and weakness in her arms and her legs. Notes the severe numbness and tingling in both her arms and her legs.   Her past medical history reveals that her general health has been good. She reports no significant medical problems.   She does note on her review of systems that she has a balance disturbance, a heart murmur, swelling in her feet and hands, neck pain, arm weakness, leg weakness, arm pain, leg pain, joint pain and swelling on a 14-point note.   Her current medications include none.   She is not allergic to anything but SULFA.  PHYSICAL EXAMINATION: I note that she offers a good range of motion of her neck turning 45 degrees left and right flexing and extending well. She has marked weakness in the upper extremities with significant spasticity in the  deltoids, biceps, triceps, grips and intrinsics all being graded to 4- out of 5. She has absent reflex in the biceps and triceps but severe hyperreflexia with clonus in both the knees and the ankles. She walks with a markedly spastic gait.  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 Today in the office to further her workup I obtained a lateral C-spine x-ray with flexion/extension views. These demonstrate that her C6-C7 fusion has healed solidly, but she has advanced degenerative changes with a slight kyphosis at C5-6 and C4-5. She also has some anterolisthesis at C3-4, but the disc in this level is normal otherwise, and the canal is widely patent. The cord compression at C4-5 and at C5-6 without a doubt are impacting her myelopathy, and I believe that she needs to have these decompressed. She would require a 2-level anterior decompression and arthrodesis as she has advanced spondylitic degeneration of the C5-6 disc space and the anterolisthesis of C4 and C5. We will plan on scheduling this at the earliest possible convenience for her.

## 2016-10-26 NOTE — Op Note (Signed)
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 History of fusion C6-C7 Procedure: Anterior cervical discectomy decompression of nerve roots and spinal canal C4-5 C5-6 arthrodesis with structural allograft, nuvasive plate fixation [id number]. Removal of tethered plate [id number] Surgeon: Barnett AbuHenry Loraine Bhullar M.D. Asst.: Tressie StalkerJeffrey Jenkins M.D. Indications: Patient is a 56 year old female who's been having progressive numbness and weakness in her upper extremities and unsteadiness of her gait this is been getting worse over the past several weeks time she is found to have severe canal compromise in her cervical spine above the C6-C7 fusion at C4-5 and C5-6. She was advised regarding the need for surgical decompression and stabilization of her neck. Procedure: The patient was brought to the operating room placed on the table in supine position. After the smooth induction of general endotracheal anesthesia neck was placed in 5 pounds of halter traction and prepped with alcohol and DuraPrep. After sterile draping and appropriate timeout procedure a transverse incision was created in the left side of the neck and carried down to the platysma. The plane between the sternocleidomastoid and strap muscles dissected bluntly until the prevertebral space was reached. The old plate was identified at C6-C7 then by uncovering it and identifying that this was a tether plate the tether removal set was used to remove the screws and remove the plate. It was noted that the left-sided screw and C7 was outfractured about midway. The end of the screw could not be removed.. The dissection was then undertaken in the longus coli muscle to allow placement of a self-retaining Caspar type retractor.  The anterior longitudinal ligament was opened at C5-6 and ventral osteophytes were removed with a  Leksell rongeur and Kerrison punch. Interspace was cleared of significant quantity of the degenerated disc material in the region of the posterior longitudinal ligament was removed. Dissection was carried out using a high-speed drill and 3-0 Karlin curettes. Uncinate processes were drilled down and removed and osteophytes from the inferior margin of the body of C5 were removed with a Kerrison 2 mm gold punch. After the central canal and lateral recesses were well decompressed hemostasis was achieved with the bipolar cautery and some small pledgets of Gelfoam soaked in thrombin that were later irrigated away.  A 6 mm tall machined allograft with lordosis was placed into the interspace. Attention was then turned to C4-5 were similar process was carried out also identifying significant uncinate spurs and particularly the ventral aspect compressing against the cord. This was stabilized with a 6 mm allograft in the interspace at C4-C5.  Next the retractor was removed and a 40 mm nuvasive  plate was placed over the vertebral bodies and secured with 15 mm variable angle screws. A final localizing radiograph identified the position of the surgical construct. The stasis was achieved in the soft tissues and then the platysma was closed with 3-0 Vicryl in an interrupted fashion and 3-0 Vicryl was used in the subcuticular tissue. Blood loss was estimated at 50 mL .

## 2016-10-26 NOTE — Transfer of Care (Signed)
Immediate Anesthesia Transfer of Care Note  Patient: Lynn Black  Procedure(s) Performed: Procedure(s): Cervical four-five, Cervical five-six  Anterior cervical decompression/discectomy/fusion (N/A)  Patient Location: PACU  Anesthesia Type:General  Level of Consciousness: awake, alert  and oriented  Airway & Oxygen Therapy: Patient Spontanous Breathing and Patient connected to nasal cannula oxygen  Post-op Assessment: Report given to RN and Post -op Vital signs reviewed and stable  Post vital signs: Reviewed and stable  Last Vitals:  Vitals:   10/26/16 1009  BP: 131/84  Pulse: 98  Resp: 20  Temp: 37.1 C    Last Pain:  Vitals:   10/26/16 1009  TempSrc: Oral         Complications: No apparent anesthesia complications

## 2016-10-26 NOTE — Anesthesia Preprocedure Evaluation (Signed)
Anesthesia Evaluation  Patient identified by MRN, date of birth, ID band Patient awake    Reviewed: Allergy & Precautions, NPO status , Patient's Chart, lab work & pertinent test results  Airway Mallampati: I  TM Distance: >3 FB Neck ROM: Full    Dental   Pulmonary Current Smoker,    Pulmonary exam normal        Cardiovascular Normal cardiovascular exam     Neuro/Psych    GI/Hepatic   Endo/Other    Renal/GU      Musculoskeletal   Abdominal   Peds  Hematology   Anesthesia Other Findings   Reproductive/Obstetrics                             Anesthesia Physical Anesthesia Plan  ASA: II  Anesthesia Plan: General   Post-op Pain Management:    Induction: Intravenous  Airway Management Planned: Oral ETT  Additional Equipment:   Intra-op Plan:   Post-operative Plan: Extubation in OR  Informed Consent: I have reviewed the patients History and Physical, chart, labs and discussed the procedure including the risks, benefits and alternatives for the proposed anesthesia with the patient or authorized representative who has indicated his/her understanding and acceptance.     Plan Discussed with: CRNA and Surgeon  Anesthesia Plan Comments:         Anesthesia Quick Evaluation

## 2016-10-26 NOTE — Anesthesia Procedure Notes (Signed)
Procedure Name: Intubation Date/Time: 10/26/2016 12:54 PM Performed by: Candis Shine Pre-anesthesia Checklist: Patient identified, Emergency Drugs available, Suction available and Patient being monitored Patient Re-evaluated:Patient Re-evaluated prior to inductionOxygen Delivery Method: Circle System Utilized Preoxygenation: Pre-oxygenation with 100% oxygen Intubation Type: IV induction Ventilation: Mask ventilation without difficulty Laryngoscope Size: Mac and 3 Grade View: Grade I Tube type: Oral Tube size: 7.0 mm Number of attempts: 1 Airway Equipment and Method: Stylet Placement Confirmation: ETT inserted through vocal cords under direct vision,  positive ETCO2 and breath sounds checked- equal and bilateral Secured at: 22 cm Tube secured with: Tape Dental Injury: Teeth and Oropharynx as per pre-operative assessment

## 2016-10-26 NOTE — Progress Notes (Signed)
Pt. Has very visual skin irritation- on arms, hands, palms of hands & her legs. Pt. Remarks that its the worst its ever been. Pt. Not currently using any medicine treatment. Call to Dr. Danielle DessElsner & reported the same.

## 2016-10-26 NOTE — Anesthesia Postprocedure Evaluation (Addendum)
Anesthesia Post Note  Patient: Lynn Black  Procedure(s) Performed: Procedure(s) (LRB): Cervical four-five, Cervical five-six  Anterior cervical decompression/discectomy/fusion (N/A)  Patient location during evaluation: PACU Anesthesia Type: General Level of consciousness: awake, awake and alert and oriented Pain management: pain level controlled Vital Signs Assessment: post-procedure vital signs reviewed and stable Respiratory status: spontaneous breathing, nonlabored ventilation and respiratory function stable Cardiovascular status: blood pressure returned to baseline Anesthetic complications: no       Last Vitals:  Vitals:   10/26/16 1645 10/26/16 1706  BP:  (!) 146/67  Pulse:    Resp:  18  Temp: 36.5 C 36.8 C    Last Pain:  Vitals:   10/26/16 1839  TempSrc:   PainSc: 7                  Jams Trickett COKER

## 2016-10-26 NOTE — Progress Notes (Signed)
Patient ID: Lynn Black, female   DOB: 10/11/1960, 56 y.o.   MRN: [id number] Vital signs are stable Patient is moving upper and lower extremities well She is comfortable Incision is clean dry Patient is swallowing well

## 2016-10-27 ENCOUNTER — Encounter (HOSPITAL_COMMUNITY): Payer: Self-pay | Admitting: Neurological Surgery

## 2016-10-27 DIAGNOSIS — M4722 Other spondylosis with radiculopathy, cervical region: Secondary | ICD-10-CM | POA: Diagnosis not present

## 2016-10-27 MED ORDER — HYDROCODONE-ACETAMINOPHEN 5-325 MG PO TABS: 1.0000 | ORAL_TABLET | ORAL | 0 refills | Status: DC | PRN

## 2016-10-27 MED ORDER — DIAZEPAM 5 MG PO TABS: 5.0000 mg | ORAL_TABLET | Freq: Three times a day (TID) | ORAL | 0 refills | Status: AC | PRN

## 2016-10-27 MED FILL — Gelatin Absorbable MT Powder: OROMUCOSAL | Qty: 1 | Status: AC

## 2016-10-27 MED FILL — Thrombin For Soln 5000 Unit: CUTANEOUS | Qty: 5000 | Status: AC

## 2016-10-27 NOTE — Evaluation (Signed)
Physical Therapy Evaluation Patient Details Name: Lynn Black MRN: [id number] DOB: 01/07/1961 Today's Date: 10/27/2016   History of Present Illness  Pt is a 56 y/o female who presents s/p C4-C6 ACDF on 10/26/16.   Clinical Impression  Pt admitted with above diagnosis. Pt currently with functional limitations due to the deficits listed below (see PT Problem List). At the time of PT eval pt was able to perform transfers and ambulation with min guard to min assist for balance support and safety. Pt will benefit from skilled PT to increase their independence and safety with mobility to allow discharge to the venue listed below.       Follow Up Recommendations No PT follow up;Supervision for mobility/OOB    Equipment Recommendations  3in1 (PT);Cane    Recommendations for Other Services       Precautions / Restrictions Precautions Precautions: Fall;Cervical Required Braces or Orthoses:  (No Brace Needed Order) Restrictions Weight Bearing Restrictions: No      Mobility  Bed Mobility Overal bed mobility: Needs Assistance Bed Mobility: Rolling;Sidelying to Sit Rolling: Supervision Sidelying to sit: Supervision       General bed mobility comments: VC's for log roll technique. Was able to complete without physical assistance.   Transfers Overall transfer level: Needs assistance Equipment used: None Transfers: Sit to/from Stand Sit to Stand: Min guard         General transfer comment: Hands-on guarding for safety as pt powered-up to full standing position. No gross LOB noted but pt was unsteady.   Ambulation/Gait Ambulation/Gait assistance: Min assist Ambulation Distance (Feet): 150 Feet Assistive device: 1 person hand held assist Gait Pattern/deviations: Step-through pattern;Decreased stride length;Trunk flexed;Ataxic Gait velocity: Decreased Gait velocity interpretation: Below normal speed for age/gender General Gait Details: Pt with ataxic appearing gait. Declines use  of RW or SPC however feel she could benefit for safety. HHA provided when railings were not being used.   Stairs            Wheelchair Mobility    Modified Rankin (Stroke Patients Only)       Balance Overall balance assessment: Needs assistance Sitting-balance support: No upper extremity supported;Feet supported Sitting balance-Leahy Scale: Good     Standing balance support: Single extremity supported;During functional activity Standing balance-Leahy Scale: Poor Standing balance comment: Requires at least 1 UE support for balance during dynamic activity.                              Pertinent Vitals/Pain Pain Assessment: 0-10 Pain Score: 5  Pain Location: Incision site Pain Descriptors / Indicators: Operative site guarding;Aching Pain Intervention(s): Limited activity within patient's tolerance;Monitored during session;Repositioned    Home Living Family/patient expects to be discharged to:: Private residence Living Arrangements: Alone Available Help at Discharge: Friend(s);Available PRN/intermittently (from 1-930 daily) Type of Home: House Home Access: Level entry     Home Layout: One level Home Equipment: None      Prior Function Level of Independence: Needs assistance   Gait / Transfers Assistance Needed: Furniture walking  ADL's / Homemaking Assistance Needed: Not able to get in shower to wash - was taking sponge baths only        Hand Dominance   Dominant Hand: Right    Extremity/Trunk Assessment   Upper Extremity Assessment Upper Extremity Assessment: Defer to OT evaluation    Lower Extremity Assessment Lower Extremity Assessment: RLE deficits/detail;LLE deficits/detail RLE Deficits / Details: Bilateral decreased strength and AROM  consistent with pre-op diagnosis. Gait appears ataxic.    Cervical / Trunk Assessment Cervical / Trunk Assessment: Other exceptions Cervical / Trunk Exceptions: s/p surgery  Communication    Communication: No difficulties  Cognition Arousal/Alertness: Awake/alert Behavior During Therapy: WFL for tasks assessed/performed Overall Cognitive Status: Within Functional Limits for tasks assessed                                        General Comments      Exercises     Assessment/Plan    PT Assessment Patient needs continued PT services  PT Problem List Decreased strength;Decreased range of motion;Decreased activity tolerance;Decreased balance;Decreased mobility;Decreased knowledge of use of DME;Decreased safety awareness;Decreased knowledge of precautions;Pain       PT Treatment Interventions DME instruction;Gait training;Stair training;Functional mobility training;Therapeutic activities;Therapeutic exercise;Neuromuscular re-education;Patient/family education    PT Goals (Current goals can be found in the Care Plan section)  Acute Rehab PT Goals Patient Stated Goal: Home today PT Goal Formulation: With patient Time For Goal Achievement: 11/03/16 Potential to Achieve Goals: Good    Frequency Min 5X/week   Barriers to discharge Decreased caregiver support Alone with friend to assist during daytime and evening (1-930)    Co-evaluation               AM-PAC PT "6 Clicks" Daily Activity  Outcome Measure Difficulty turning over in bed (including adjusting bedclothes, sheets and blankets)?: A Little Difficulty moving from lying on back to sitting on the side of the bed? : A Little Difficulty sitting down on and standing up from a chair with arms (e.g., wheelchair, bedside commode, etc,.)?: Total Help needed moving to and from a bed to chair (including a wheelchair)?: A Little Help needed walking in hospital room?: A Little Help needed climbing 3-5 steps with a railing? : A Lot 6 Click Score: 15    End of Session Equipment Utilized During Treatment: Gait belt Activity Tolerance: Patient tolerated treatment well Patient left: in chair;with call  bell/phone within reach;with family/visitor present Nurse Communication: Mobility status PT Visit Diagnosis: Unsteadiness on feet (R26.81);Pain Pain - part of body:  (Neck)    Time: 0810-0829 PT Time Calculation (min) (ACUTE ONLY): 19 min   Charges:   PT Evaluation $PT Eval Moderate Complexity: 1 Procedure     PT G Codes:   PT G-Codes **NOT FOR INPATIENT CLASS** Functional Assessment Tool Used: Clinical judgement Functional Limitation: Mobility: Walking and moving around Mobility: Walking and Moving Around Current Status (W0981(G8978): At least 40 percent but less than 60 percent impaired, limited or restricted Mobility: Walking and Moving Around Goal Status [phone](G8979): At least 20 percent but less than 40 percent impaired, limited or restricted    Conni SlipperLaura Taran Haynesworth, PT, DPT Acute Rehabilitation Services Pager: [phone]   Marylynn PearsonLaura D Adaisha Campise 10/27/2016, 10:55 AM

## 2016-10-27 NOTE — Discharge Summary (Signed)
Physician Discharge Summary  Patient ID: Lynn Black MRN: [name] DOB/AGE: 55/10/1960 55 y.o.  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 rate 18, height 5\' 2"  (1.575 m), weight 49.9 kg (110 lb), SpO2 100 %. Incision is clean and dry. Motor function is intact. Station and gait are intact.  Disposition: 01-Home or Self Care  Discharge Instructions    Call MD for:  redness, tenderness, or signs of infection (pain, swelling, redness, odor or green/yellow discharge around incision site)    Complete by:  As directed    Call MD for:  severe uncontrolled pain    Complete by:  As directed    Call MD for:  temperature >100.4    Complete by:  As directed    Diet - low sodium heart healthy    Complete by:  As directed    Discharge instructions    Complete by:  As directed    Okay to shower. Do not apply salves or appointments to incision. No heavy lifting with the upper extremities greater than 15 pounds. May resume driving when not requiring pain medication and patient feels comfortable with doing so.   Incentive spirometry RT    Complete by:  As directed    Increase activity slowly    Complete by:  As directed      Allergies as of 10/27/2016      Reactions   Nickel Other (See Comments)   Unknown   Shellfish Allergy Other (See Comments)   Sulfa Antibiotics Other (See Comments)   Unknown   Other Other (See Comments)    Tomatoes, onions, italian   Latex Hives, Rash      Medication List    TAKE these medications   diazepam 5 MG tablet Commonly known as:  VALIUM Take 1 tablet (5 mg total) by mouth every 8 (eight) hours as needed for muscle spasms.   diphenhydrAMINE 25 MG tablet Commonly known as:  BENADRYL Take 25 mg by mouth 2 (two) times daily as needed.   gabapentin 100 MG capsule Commonly known as:  NEURONTIN Take 1 capsule (100 mg total) by mouth 3 (three) times daily.   HYDROcodone-acetaminophen 5-325 MG tablet Commonly known as:  NORCO/VICODIN Take 1-2 tablets by mouth every 4 (four) hours as needed (breakthrough pain).   ketoconazole 2 % cream Commonly known as:  NIZORAL Apply 1 application topically daily.   predniSONE 10 MG (21) Tbpk tablet Commonly known as:  STERAPRED UNI-PAK 21 TAB Take 1 tablet (10 mg total) by mouth daily. Take 6 tabs by mouth daily  for 2 days, then 5 tabs for 2 days, then 4 tabs for 2 days, then 3 tabs for 2 days, 2 tabs for 2 days, then 1 tab by mouth daily for 2 days   triamcinolone cream 0.1 % Commonly known as:  KENALOG Apply 1 application topically 2 (two) times daily.        SignedStefani Dama: Lynn Black 10/27/2016, 7:57 AM

## 2016-10-27 NOTE — Progress Notes (Signed)
Occupational Therapy Evaluation Patient Details Name: Lynn Black MRN: [id number] DOB: 17-Mar-1961 Today's Date: 10/27/2016    History of Present Illness Pt is a 56 y/o female who presents s/p C4-C6 ACDF on 10/26/16.    Clinical Impression   Pt seen for 2 visits. Completed education regarding compensatory techniques for cervical precautions, home safety, reducing risk of falls and establishing HEP for B hand/initrinsic strengthening and R scapular strengthening. Pt states B numbness has improved since surgery, but she states she continues to drop items frequently. Pt given AE to increase independence with ADL  and putty for B hand strengthening . Pt needs 3 in1 for safe DC. Pt verbalizes understanding of instructions. Pt to follow up with MD to determine further needs    Follow Up Recommendations  No OT follow up;Supervision - Intermittent    Equipment Recommendations  3 in 1 bedside commode    Recommendations for Other Services       Precautions / Restrictions Precautions Precautions: Fall;Cervical Required Braces or Orthoses:  (No Brace Needed Order) Restrictions Weight Bearing Restrictions: No      Mobility Bed Mobility Overal bed mobility: Modified Independent Bed Mobility: Rolling;Sidelying to Sit Rolling:         General bed mobility comments: VC's for log roll technique. Was able to complete without physical assistance.   Transfers Overall transfer level: Modified independent Equipment used: None Transfers: Sit to/from Stand          .     Balance Overall balance assessment: Needs assistance Sitting-balance support: No upper extremity supported;Feet supported Sitting balance-Leahy Scale: Good     Standing balance support: Single extremity supported;During functional activity Standing balance-Leahy Scale: Fair Standing balance comment: Requires at least 1 UE support for balance during dynamic activity.                            ADL either  performed or assessed with clinical judgement   ADL Overall ADL's : Needs assistance/impaired Eating/Feeding: Modified independent Eating/Feeding Details (indicate cue type and reason): Pt states she drops her utensils. Pt given adaptive tubing for utensils Grooming: Modified independent   Upper Body Bathing: Supervision/ safety                           Functional mobility during ADLs: Supervision/safety General ADL Comments: Complted education regarding compensatory techniques for ADL and mobility. Educated on reducing risk of falls. Educataed on cervical precautions. Given written hadnouts. Educated to use her reacher to obtain items from floor and asist wtih IADL. Educated on proper set up of home environment     Vision         Perception     Praxis      Pertinent Vitals/Pain Pain Assessment: 0-10 Pain Score: 2  Pain Location: Incision site Pain Descriptors / Indicators: Operative site guarding;Aching Pain Intervention(s): Limited activity within patient's tolerance     Hand Dominance Right   Extremity/Trunk Assessment Upper Extremity Assessment Upper Extremity Assessment: Generalized weakness;RUE deficits/detail RUE Deficits / Details: RUE weaker than L. 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 c/o B hand "tingling". Numbness has improved since surgery. Pt reports frequently dropping items RUE Sensation: decreased light touch RUE Coordination: decreased fine motor   Lower Extremity Assessment Lower Extremity Assessment: Defer to PT evaluation RLE Deficits / Details: Bilateral decreased strength and AROM consistent with pre-op diagnosis. Gait appears ataxic.  Cervical / Trunk Assessment Cervical / Trunk Assessment: Other exceptions Cervical / Trunk Exceptions: s/p surgery   Communication Communication Communication: No difficulties   Cognition Arousal/Alertness: Awake/alert Behavior During Therapy: WFL for tasks  assessed/performed Overall Cognitive Status: Within Functional Limits for tasks assessed                                     General Comments       Exercises Exercises: Other exercises Other Exercises Other Exercises: grip/pinch strengtheing with level 1 theraputty; intrinsic strengthening Other Exercises: fine motor/coordination Other Exercises: scapualr strengthening   Shoulder Instructions      Home Living Family/patient expects to be discharged to:: Private residence Living Arrangements: Alone Available Help at Discharge: Friend(s);Available PRN/intermittently (from 1-930 daily) Type of Home: House Home Access: Level entry     Home Layout: One level     Bathroom Shower/Tub: Chief Strategy Officer: Standard Bathroom Accessibility: Yes How Accessible: Accessible via walker Home Equipment: None          Prior Functioning/Environment Level of Independence: Needs assistance  Gait / Transfers Assistance Needed: Furniture walking ADL's / Homemaking Assistance Needed: Not able to get in shower to wash - was taking sponge baths only            OT Problem List: Decreased strength;Decreased range of motion;Decreased activity tolerance;Impaired balance (sitting and/or standing);Decreased coordination;Decreased knowledge of use of DME or AE;Decreased knowledge of precautions;Impaired sensation;Pain      OT Treatment/Interventions:      OT Goals(Current goals can be found in the care plan section) Acute Rehab OT Goals Patient Stated Goal: Home today OT Goal Formulation: All assessment and education complete, DC therapy  OT Frequency:     Barriers to D/C:            Co-evaluation              AM-PAC PT "6 Clicks" Daily Activity     Outcome Measure Help from another person eating meals?: None Help from another person taking care of personal grooming?: None Help from another person toileting, which includes using toliet, bedpan, or  urinal?: None Help from another person bathing (including washing, rinsing, drying)?: None Help from another person to put on and taking off regular upper body clothing?: None Help from another person to put on and taking off regular lower body clothing?: None 6 Click Score: 24   End of Session Nurse Communication: Other (comment) (DC needs)  Activity Tolerance: Patient tolerated treatment well Patient left: in bed;with call bell/phone within reach  OT Visit Diagnosis: Unsteadiness on feet (R26.81);Muscle weakness (generalized) (M62.81);Pain Pain - Right/Left: Right Pain - part of body:  (neck)                Time: 1610-9604 OT Time Calculation (min): 11 min Charges:  OT General Charges $OT Visit:  (2 visits) OT Evaluation $OT Eval Low Complexity: 1 Procedure OT Treatments $Therapeutic Activity: 8-22 mins G-Codes: OT G-codes **NOT FOR INPATIENT CLASS** Functional Assessment Tool Used: Clinical judgement Functional Limitation: Self care Self Care Current Status (V4098): At least 1 percent but less than 20 percent impaired, limited or restricted Self Care Goal Status (J1914): At least 1 percent but less than 20 percent impaired, limited or restricted Self Care Discharge Status [phone]): At least 1 percent but less than 20 percent impaired, limited or restricted   [facility], OT/L  [phone] 10/27/2016  Jahni Nazar,HILLARY 10/27/2016, 12:42 PM

## 2016-10-27 NOTE — Progress Notes (Signed)
Discharge instructions reviewed with patient/ RXs given. All answered at this time. Transport home by family.   Sim BoastHavy, RN

## 2016-12-11 ENCOUNTER — Emergency Department (HOSPITAL_COMMUNITY)
Admission: EM | Admit: 2016-12-11 | Discharge: 2016-12-11 | Disposition: A | Discharge status: Home / Self Care | Payer: Medicaid Other | Attending: Emergency Medicine | Admitting: Emergency Medicine

## 2016-12-11 ENCOUNTER — Encounter (HOSPITAL_COMMUNITY): Payer: Self-pay | Admitting: Emergency Medicine

## 2016-12-11 DIAGNOSIS — F17210 Nicotine dependence, cigarettes, uncomplicated: Secondary | ICD-10-CM | POA: Insufficient documentation

## 2016-12-11 DIAGNOSIS — T7840XA Allergy, unspecified, initial encounter: Secondary | ICD-10-CM | POA: Diagnosis not present

## 2016-12-11 DIAGNOSIS — Z91018 Allergy to other foods: Secondary | ICD-10-CM | POA: Diagnosis not present

## 2016-12-11 DIAGNOSIS — L309 Dermatitis, unspecified: Secondary | ICD-10-CM | POA: Diagnosis not present

## 2016-12-11 DIAGNOSIS — Z91013 Allergy to seafood: Secondary | ICD-10-CM | POA: Insufficient documentation

## 2016-12-11 DIAGNOSIS — Z91040 Latex allergy status: Secondary | ICD-10-CM | POA: Insufficient documentation

## 2016-12-11 DIAGNOSIS — Z79899 Other long term (current) drug therapy: Secondary | ICD-10-CM | POA: Insufficient documentation

## 2016-12-11 DIAGNOSIS — R21 Rash and other nonspecific skin eruption: Secondary | ICD-10-CM | POA: Diagnosis present

## 2016-12-11 LAB — BASIC METABOLIC PANEL
ANION GAP: 11 (ref 5–15)
BUN: 5 mg/dL — ABNORMAL LOW (ref 6–20)
CO2: 20 mmol/L — AB (ref 22–32)
Calcium: 9 mg/dL (ref 8.9–10.3)
Chloride: 100 mmol/L — ABNORMAL LOW (ref 101–111)
Creatinine, Ser: 0.58 mg/dL (ref 0.44–1.00)
GFR calc Af Amer: >60 mL/min (ref >60)
GFR calc non Af Amer: >60 mL/min (ref >60)
GLUCOSE: 95 mg/dL (ref 65–99)
Potassium: 3.8 mmol/L (ref 3.5–5.1)
Sodium: 131 mmol/L — ABNORMAL LOW (ref 135–145)

## 2016-12-11 LAB — CBC WITH DIFFERENTIAL/PLATELET
Basophils Absolute: 0 10*3/uL (ref 0.0–0.1)
Basophils Relative: 0 %
Eosinophils Absolute: 0.3 10*3/uL (ref 0.0–0.7)
Eosinophils Relative: 4 %
HEMATOCRIT: 37.3 % (ref 36.0–46.0)
Hemoglobin: 12.9 g/dL (ref 12.0–15.0)
LYMPHS ABS: 1.2 10*3/uL (ref 0.7–4.0)
Lymphocytes Relative: 14 %
MCH: 33.8 pg (ref 26.0–34.0)
MCHC: 34.6 g/dL (ref 30.0–36.0)
MCV: 97.6 fL (ref 78.0–100.0)
MONO ABS: 2 10*3/uL — AB (ref 0.1–1.0)
MONOS PCT: 22 %
NEUTROS ABS: 5.3 10*3/uL (ref 1.7–7.7)
NEUTROS PCT: 60 %
Platelets: 194 10*3/uL (ref 150–400)
RBC: 3.82 MIL/uL — ABNORMAL LOW (ref 3.87–5.11)
RDW: 12.9 % (ref 11.5–15.5)
WBC: 8.8 10*3/uL (ref 4.0–10.5)

## 2016-12-11 MED ORDER — METHYLPREDNISOLONE SODIUM SUCC 125 MG IJ SOLR
125.0000 mg | Freq: Once | INTRAMUSCULAR | Status: AC
  Administered 2016-12-11: 125 mg via INTRAVENOUS
  Filled 2016-12-11: qty 2

## 2016-12-11 MED ORDER — SODIUM CHLORIDE 0.9 % IV SOLN
INTRAVENOUS | Status: DC
  Administered 2016-12-11: 19:00:00 via INTRAVENOUS

## 2016-12-11 MED ORDER — DIPHENHYDRAMINE HCL 50 MG/ML IJ SOLN
25.0000 mg | Freq: Once | INTRAMUSCULAR | Status: AC
  Administered 2016-12-11: 25 mg via INTRAVENOUS
  Filled 2016-12-11: qty 1

## 2016-12-11 MED ORDER — PREDNISONE 10 MG PO TABS: 40.0000 mg | ORAL_TABLET | Freq: Every day | ORAL | 0 refills | Status: DC

## 2016-12-11 MED ORDER — FAMOTIDINE IN NACL 20-0.9 MG/50ML-% IV SOLN
20.0000 mg | Freq: Once | INTRAVENOUS | Status: AC
  Administered 2016-12-11: 20 mg via INTRAVENOUS
  Filled 2016-12-11: qty 50

## 2016-12-11 MED ORDER — SODIUM CHLORIDE 0.9 % IV BOLUS (SEPSIS)
500.0000 mL | Freq: Once | INTRAVENOUS | Status: AC
  Administered 2016-12-11: 500 mL via INTRAVENOUS

## 2016-12-11 NOTE — Discharge Instructions (Signed)
Take the steroid prednisone as directed for the next 5 days. Follow-up with [facility]. Return for any new or worse symptoms. Return for any blisters full of fluid return for fevers or feeling sick in your body. As we discussed suspect this is a exacerbation of eczema probably triggered by an allergic reaction.

## 2016-12-11 NOTE — ED Triage Notes (Signed)
Pt here sts allergic reaction after eating onions on Wednesday; redness noted to hands with skin peeling off; redness now going up arms

## 2016-12-11 NOTE — ED Provider Notes (Signed)
[facility]PT Provider Note   CSN: [id number] Arrival date & time: 12/11/16  1355     History   Chief Complaint Chief Complaint  Patient presents with   Allergic Reaction    HPI Lynn Black is a 56 y.o. female.  Patient with a known history of eczema. And lots of allergies. Patient claims that on Wednesday she had some onions which she is allergic to but these were the regular green onions/that should be okay I next morning she had swelling in her hands. Now there is some peeling of the skin there are no bullae no open blisters. States this happened before to her feet. Her doctors in the past and felt it was an exacerbation of her eczema. No tongue swelling no lip swelling does have some rash on the back does have some perioral rash. She has pre-existing rash to both lower extremities that was there prior to this reaction. Patient states no fever does not stay feel bad no abdominal pain no chest pain no shortness of breath no nausea no vomiting no diarrhea. Patient does not feel ill. Patient drove herself here.      Past Medical History:  Diagnosis Date   Eczema    Heart murmur    " nothing to wory about"    Patient Active Problem List   Diagnosis Date Noted   Cervical arthritis with myelopathy 10/26/2016    Past Surgical History:  Procedure Laterality Date   ANTERIOR CERVICAL DECOMP/DISCECTOMY FUSION N/A 10/26/2016   Procedure: Cervical four-five, Cervical five-six  Anterior cervical decompression/discectomy/fusion;  Surgeon: Barnett AbuElsner, Henry, MD;  [facility];  Service: Neurosurgery;  Laterality: N/A;   CERVICAL DISCECTOMY     ROTATOR CUFF REPAIR Right     OB History    No data available       Home Medications    Prior to Admission medications   Medication Sig Start Date End Date Taking? Authorizing Provider  diazepam (VALIUM) 5 MG tablet Take 1 tablet (5 mg total) by mouth every 8 (eight) hours as needed for muscle spasms. 10/27/16 10/27/17   Barnett AbuElsner, Henry, MD  diphenhydrAMINE (BENADRYL) 25 MG tablet Take 25 mg by mouth 2 (two) times daily as needed.    [provider]  gabapentin (NEURONTIN) 100 MG capsule Take 1 capsule (100 mg total) by mouth 3 (three) times daily. 10/18/16   Raeford RazorKohut, Stephen, MD  HYDROcodone-acetaminophen (NORCO/VICODIN) 5-325 MG tablet Take 1-2 tablets by mouth every 4 (four) hours as needed (breakthrough pain). 10/27/16   Barnett AbuElsner, Henry, MD  ketoconazole (NIZORAL) 2 % cream Apply 1 application topically daily. Patient not taking: Reported on 07/26/2016 06/20/16   Georgiana ShoreMitchell, Jessica B, PA-C  predniSONE (STERAPRED UNI-PAK 21 TAB) 10 MG (21) TBPK tablet Take 1 tablet (10 mg total) by mouth daily. Take 6 tabs by mouth daily  for 2 days, then 5 tabs for 2 days, then 4 tabs for 2 days, then 3 tabs for 2 days, 2 tabs for 2 days, then 1 tab by mouth daily for 2 days Patient not taking: Reported on 10/22/2016 07/26/16   Nira Connardama, Pedro Eduardo, MD  triamcinolone cream (KENALOG) 0.1 % Apply 1 application topically 2 (two) times daily. Patient not taking: Reported on 10/22/2016 07/26/16   Nira Connardama, Pedro Eduardo, MD    Family History History reviewed. No pertinent family history.  Social History Social History  Substance Use Topics   Smoking status: Current Every Day Smoker    Packs/day: 0.50    Years: 30.00  Types: Cigarettes   Smokeless tobacco: Never Used   Alcohol use 1.2 oz/week    2 Glasses of wine per week     Allergies   Nickel; Shellfish allergy; Sulfa antibiotics; Other; and Latex   Review of Systems Review of Systems  Constitutional: Negative for fatigue and fever.  HENT: Negative for congestion, drooling, facial swelling, mouth sores, sore throat and trouble swallowing.   Eyes: Negative for redness and visual disturbance.  Respiratory: Negative for shortness of breath.   Cardiovascular: Negative for chest pain.  Gastrointestinal: Negative for abdominal pain, diarrhea, nausea and vomiting.   Genitourinary: Negative for dysuria and hematuria.  Musculoskeletal: Negative for back pain, joint swelling, myalgias, neck pain and neck stiffness.  Skin: Positive for rash.  Neurological: Negative for headaches.  Hematological: Does not bruise/bleed easily.  Psychiatric/Behavioral: Negative for confusion.     Physical Exam Updated Vital Signs BP (!) 131/93 (BP Location: Right Arm)    Pulse (!) 121    Temp 99.6 F (37.6 C) (Oral)    Resp 18    SpO2 100%   Physical Exam  Constitutional: She is oriented to person, place, and time. She appears well-developed and well-nourished. No distress.  HENT:  Head: Normocephalic and atraumatic.  Mouth/Throat: Oropharynx is clear and moist. No oropharyngeal exudate.  No tongue swelling no lip swelling. A little bit of perioral erythema.  Eyes: Conjunctivae and EOM are normal. Pupils are equal, round, and reactive to light.  Neck: Normal range of motion. Neck supple.  Cardiovascular: Normal rate, regular rhythm and normal heart sounds.   Pulmonary/Chest: Effort normal and breath sounds normal. No respiratory distress.  Abdominal: Soft. Bowel sounds are normal. There is no tenderness.  Neurological: She is alert and oriented to person, place, and time. No cranial nerve deficit or sensory deficit. She exhibits normal muscle tone. Coordination normal.  Skin: Skin is warm. Capillary refill takes less than 2 seconds. Rash noted.  [place] type rash to both lower extremities patient states that's been there long-term. Chronic consistent with her eczema. Posterior part of her back predominantly on the right side has 3 scaly patches with an erythematous base. Both forearms with scaly skin. Peeling of both palms. No open lesions. Refill normal. No bullae. No target lesions skin does not peel with pressure.  Nursing note and vitals reviewed.    ED Treatments / Results  Labs (all labs ordered are listed, but only abnormal results are displayed) Labs Reviewed  - No data to display  EKG  EKG Interpretation None       Radiology No results found.  Procedures Procedures (including critical care time)  Medications Ordered in ED Medications - No data to display   Initial Impression / Assessment and Plan / ED Course  I have reviewed the triage vital signs and the nursing notes.  Pertinent labs & imaging results that were available during my care of the patient were reviewed by me and considered in my medical decision making (see chart for details).    Patient symptoms initially were concerning for TEN, when got a clear picture of what was going on. Patient has significant eczema. Patient's had this happen before when she's had an allergic reaction and they felt it was secondary to exacerbation of her eczema. Patient systemically feels fine she's not toxic labs without significant abnormalities no fevers no leukocytosis. Vital signs of been stable other than a little bit of tachycardia. Patient feels better here with Solu-Medrol also given Benadryl and Pepcid although  not a true allergic reaction. No wheezing no lip swelling no tongue swelling. Feel that patient will benefit the most by steroids for the next 5 days. She'll return for any new or worse symptoms at all.   Final Clinical Impressions(s) / ED Diagnoses   Final diagnoses:  None    New Prescriptions New Prescriptions   No medications on file     Vanetta Mulders, MD 12/11/16 2046

## 2016-12-11 NOTE — ED Notes (Signed)
Pt understood dc materia. NAD noted. [facility]

## 2016-12-16 ENCOUNTER — Encounter (HOSPITAL_COMMUNITY): Payer: Self-pay | Admitting: Emergency Medicine

## 2016-12-16 ENCOUNTER — Emergency Department (HOSPITAL_COMMUNITY)
Admission: EM | Admit: 2016-12-16 | Discharge: 2016-12-16 | Disposition: A | Discharge status: Home / Self Care | Payer: Medicaid Other | Attending: Emergency Medicine | Admitting: Emergency Medicine

## 2016-12-16 DIAGNOSIS — L309 Dermatitis, unspecified: Secondary | ICD-10-CM

## 2016-12-16 DIAGNOSIS — F17210 Nicotine dependence, cigarettes, uncomplicated: Secondary | ICD-10-CM | POA: Insufficient documentation

## 2016-12-16 DIAGNOSIS — Z79899 Other long term (current) drug therapy: Secondary | ICD-10-CM | POA: Diagnosis not present

## 2016-12-16 DIAGNOSIS — R21 Rash and other nonspecific skin eruption: Secondary | ICD-10-CM | POA: Diagnosis present

## 2016-12-16 DIAGNOSIS — Z91040 Latex allergy status: Secondary | ICD-10-CM | POA: Insufficient documentation

## 2016-12-16 MED ORDER — PREDNISONE 10 MG PO TABS: ORAL_TABLET | ORAL | 0 refills | Status: DC

## 2016-12-16 NOTE — ED Provider Notes (Signed)
[facility] Provider Note   CSN: [id number] Arrival date & time: 12/16/16  1020  By signing my name below, I, Thelma Bargeick Cochran, attest that this documentation has been prepared under the direction and in the presence of Langston MaskerKaren Eraina Winnie, [place]PA-C. Electronically Signed: Thelma Bargeick Cochran, Scribe. 12/16/16. 11:36 AM.  History   Chief Complaint Chief Complaint  Patient presents with   Rash    ezcema   The history is provided by the patient. No language interpreter was used.    HPI Comments: Lynn Black is a 56 y.o. female with PMHx of eczema who presents to the Emergency Department complaining of constant, gradually worsening dry skin on her palms and feet for 1 week. She states she ate green onions, which she is allergic to, which caused the reaction. She was seen in the ED for this 5 days ago and took 5 days worth of prednisone with no relief. Pt does not use a moisturizer at home.  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 Authorizing Provider  diazepam (VALIUM) 5 MG tablet Take 1 tablet (5 mg total) by mouth every 8 (eight) hours as needed for muscle spasms. 10/27/16 10/27/17  Barnett AbuElsner, Henry, MD  diphenhydrAMINE (BENADRYL) 25 MG tablet Take 25 mg by mouth 2 (two) times daily as needed.    [provider]  gabapentin  (NEURONTIN) 100 MG capsule Take 1 capsule (100 mg total) by mouth 3 (three) times daily. 10/18/16   Raeford RazorKohut, Stephen, MD  HYDROcodone-acetaminophen (NORCO/VICODIN) 5-325 MG tablet Take 1-2 tablets by mouth every 4 (four) hours as needed (breakthrough pain). 10/27/16   Barnett AbuElsner, Henry, MD  ketoconazole (NIZORAL) 2 % cream Apply 1 application topically daily. Patient not taking: Reported on 07/26/2016 06/20/16   Mathews RobinsonsMitchell, Jessica B, PA-C  predniSONE (DELTASONE) 10 MG tablet Take 4 tablets (40 mg total) by mouth daily. 12/11/16   Vanetta MuldersZackowski, Scott, MD  predniSONE (STERAPRED UNI-PAK 21 TAB) 10 MG (21) TBPK tablet Take 1 tablet (10 mg total) by mouth daily. Take 6 tabs by mouth daily  for 2 days, then 5 tabs for 2 days, then 4 tabs for 2 days, then 3 tabs for 2 days, 2 tabs for 2 days, then 1 tab by mouth daily for 2 days Patient not taking: Reported on 10/22/2016 07/26/16   Nira Connardama, Pedro Eduardo, MD  triamcinolone cream (KENALOG) 0.1 % Apply 1 application topically 2 (two) times daily. Patient not taking: Reported on 10/22/2016 07/26/16   Nira Connardama, Pedro Eduardo, MD    Family History No family history on file.  Social History Social History  Substance Use Topics   Smoking status: Current Every Day Smoker    Packs/day: 0.50    Years: 30.00    Types: Cigarettes   Smokeless tobacco: Never Used  Alcohol use 1.2 oz/week    2 Glasses of wine per week     Allergies   Nickel; Shellfish allergy; Sulfa antibiotics; Other; and Latex   Review of Systems Review of Systems  Skin:       eczema  All other systems reviewed and are negative.    Physical Exam Updated Vital Signs BP (!) 154/83 (BP Location: Left Arm)    Pulse (!) 107    Temp 98.3 F (36.8 C) (Oral)    Resp 16    Ht 5\' 2"  (1.575 m)    Wt 110 lb (49.9 kg)    SpO2 100%    BMI 20.12 kg/m   Physical Exam  Constitutional: She is oriented to person, place, and time. She appears well-developed and well-nourished.  HENT:  Head: Normocephalic.   Eyes: EOM are normal.  Neck: Normal range of motion.  Pulmonary/Chest: Effort normal.  Abdominal: She exhibits no distension.  Musculoskeletal: Normal range of motion.  Neurological: She is alert and oriented to person, place, and time.  Skin:  Dry, scaly, erythematous skin  Psychiatric: She has a normal mood and affect.  Nursing note and vitals reviewed.    ED Treatments / Results  DIAGNOSTIC STUDIES: Oxygen Saturation is 100% on RA, normal by my interpretation.    COORDINATION OF CARE: 11:36 AM Discussed treatment plan with pt at bedside and pt agreed to plan.  Labs (all labs ordered are listed, but only abnormal results are displayed) Labs Reviewed - No data to display  EKG  EKG Interpretation None       Radiology No results found.  Procedures Procedures (including critical care time)  Medications Ordered in ED Medications - No data to display   Initial Impression / Assessment and Plan / ED Course  I have reviewed the triage vital signs and the nursing notes.  Pertinent labs & imaging results that were available during my care of the patient were reviewed by me and considered in my medical decision making (see chart for details).       Final Clinical Impressions(s) / ED Diagnoses   Final diagnoses:  Eczema, unspecified type    New Prescriptions Discharge Medication List as of 12/16/2016 11:38 AM    An After Visit Summary was printed and given to the patient. Meds ordered this encounter  Medications   predniSONE (DELTASONE) 10 MG tablet    Sig: 6,6,5,5,4,4,3,3,2,2,1,1 taper    Dispense:  42 tablet    Refill:  0    Order Specific Question:   Supervising Provider    Answer:   Geoffery Lyons [[id number]]    I personally performed the services in this documentation, which was scribed in my presence.  The recorded information has been reviewed and considered.   Barnet Pall.    Elson Areas, PA-C 12/16/16 1322    Geoffery Lyons, MD 12/18/16  [phone]

## 2016-12-16 NOTE — ED Triage Notes (Signed)
Pt. Stated, I have a eczema and I was here on Friday and given medication and no better.

## 2016-12-16 NOTE — Progress Notes (Signed)
Pt has been to ED 6 times in the last 6 months. No PCP or insurance or other services noted to have been offered to pt previously. Attempted to speak with pt prior to D/C but pt had already left the room.  CM should follow up on pt return to the ED.

## 2017-01-07 ENCOUNTER — Emergency Department (HOSPITAL_COMMUNITY)
Admission: EM | Admit: 2017-01-07 | Discharge: 2017-01-07 | Disposition: A | Discharge status: Home / Self Care | Payer: Medicaid Other | Attending: Emergency Medicine | Admitting: Emergency Medicine

## 2017-01-07 DIAGNOSIS — L309 Dermatitis, unspecified: Secondary | ICD-10-CM | POA: Diagnosis not present

## 2017-01-07 DIAGNOSIS — Z79899 Other long term (current) drug therapy: Secondary | ICD-10-CM | POA: Insufficient documentation

## 2017-01-07 DIAGNOSIS — R21 Rash and other nonspecific skin eruption: Secondary | ICD-10-CM | POA: Diagnosis present

## 2017-01-07 DIAGNOSIS — F17210 Nicotine dependence, cigarettes, uncomplicated: Secondary | ICD-10-CM | POA: Insufficient documentation

## 2017-01-07 DIAGNOSIS — Z91040 Latex allergy status: Secondary | ICD-10-CM | POA: Diagnosis not present

## 2017-01-07 MED ORDER — PREDNISONE 10 MG PO TABS: ORAL_TABLET | ORAL | 0 refills | Status: DC

## 2017-01-07 MED FILL — ?PREDNISONE 10 MG TABLET: 10 | 12 days supply | Qty: 42 | Fill #0

## 2017-01-07 NOTE — ED Notes (Signed)
Has been seen multiple times for same skin rash.  Social worker consult

## 2017-01-07 NOTE — Discharge Instructions (Signed)
Please read attached information regarding your condition. Take prednisone taper as directed. Return to ED for worsening symptoms, trouble breathing, trouble swallowing, fevers, signs of severe allergic reaction.

## 2017-01-07 NOTE — ED Provider Notes (Signed)
[facility] Provider Note   CSN: [id number] Arrival date & time: 01/07/17  1114     History   Chief Complaint Chief Complaint  Patient presents with   Rash   Eczema    HPI Rayburn MaLaura J Andes is a 56 y.o. female.  HPI  Patient, with past medical history of eczema, presents to ED for evaluation of 1 week history of gradually worsening dry skin rash on her palms and bottom of her feet. She is unsure what triggered her eczema this time. She reports previous similar episodes in the past which has resolved with prednisone. Able to see a dermatologist due to no insurance. She denies fevers, chills, signs of severe allergic reaction including no lip swelling, no trouble breathing, no trouble swallowing. She denies any new soaps, lotions, detergents or recent exposure to outside that triggered the eczema. She states that there are several things that she has noticed triggers her eczema.  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 Authorizing Provider  diazepam (VALIUM) 5 MG tablet Take 1 tablet (5 mg total) by mouth every 8 (eight) hours as needed for muscle spasms. 10/27/16 10/27/17  Barnett AbuElsner, Henry, MD  diphenhydrAMINE (BENADRYL) 25 MG tablet Take 25 mg by mouth 2 (two) times daily as needed.    [provider]  gabapentin  (NEURONTIN) 100 MG capsule Take 1 capsule (100 mg total) by mouth 3 (three) times daily. 10/18/16   Raeford RazorKohut, Stephen, MD  HYDROcodone-acetaminophen (NORCO/VICODIN) 5-325 MG tablet Take 1-2 tablets by mouth every 4 (four) hours as needed (breakthrough pain). 10/27/16   Barnett AbuElsner, Henry, MD  ketoconazole (NIZORAL) 2 % cream Apply 1 application topically daily. Patient not taking: Reported on 07/26/2016 06/20/16   Mathews RobinsonsMitchell, Jessica B, PA-C  predniSONE (DELTASONE) 10 MG tablet 6,6,5,5,4,4,3,3,2,2,1,1 taper 01/07/17   Waniya Hoglund, PA-C  triamcinolone cream (KENALOG) 0.1 % Apply 1 application topically 2 (two) times daily. Patient not taking: Reported on 10/22/2016 07/26/16   Nira Connardama, Pedro Eduardo, MD    Family History No family history on file.  Social History Social History  Substance Use Topics   Smoking status: Current Every Day Smoker    Packs/day: 0.50    Years: 30.00    Types: Cigarettes   Smokeless tobacco: Never Used   Alcohol use 1.2 oz/week    2 Glasses of wine per week     Allergies   Nickel; Shellfish allergy; Sulfa antibiotics; Other; Latex; and Neosporin [neomycin-bacitracin zn-polymyx]   Review of Systems Review of Systems  Constitutional: Negative for appetite change, chills and fever.  HENT: Negative for ear pain, rhinorrhea, sneezing and sore throat.   Eyes: Negative for photophobia and visual disturbance.  Respiratory: Negative for cough, chest tightness, shortness of breath and wheezing.   Cardiovascular: Negative for chest  pain and palpitations.  Gastrointestinal: Negative for abdominal pain, blood in stool, constipation, diarrhea, nausea and vomiting.  Genitourinary: Negative for dysuria, hematuria and urgency.  Musculoskeletal: Negative for myalgias.  Skin: Positive for rash.  Neurological: Negative for dizziness, weakness and light-headedness.     Physical Exam Updated Vital Signs BP (!) 155/86 (BP Location: Right Arm)    Pulse (!) 106    Temp 98.3 F (36.8  C) (Oral)    Resp 16    Ht 5\' 2"  (1.575 m)    Wt 49.9 kg (110 lb)    SpO2 100%    BMI 20.12 kg/m   Physical Exam  Constitutional: She appears well-developed and well-nourished. No distress.  HENT:  Head: Normocephalic and atraumatic.  Nose: Nose normal.  Eyes: Conjunctivae and EOM are normal. Right eye exhibits no discharge. Left eye exhibits no discharge. No scleral icterus.  Neck: Normal range of motion. Neck supple.  Cardiovascular: Normal rate, regular rhythm, normal heart sounds and intact distal pulses.  Exam reveals no gallop and no friction rub.   No murmur heard. Pulmonary/Chest: Effort normal and breath sounds normal. No respiratory distress.  Abdominal: Soft. Bowel sounds are normal. She exhibits no distension. There is no tenderness. There is no guarding.  Musculoskeletal: Normal range of motion. She exhibits no edema.  Neurological: She is alert. She exhibits normal muscle tone. Coordination normal.  Skin: Skin is warm and dry. Rash noted.  Dry, scaly areas of skin on the palms and bottom of the left foot. There is associated excoriation and flaking noted.  Psychiatric: She has a normal mood and affect.  Nursing note and vitals reviewed.    ED Treatments / Results  Labs (all labs ordered are listed, but only abnormal results are displayed) Labs Reviewed - No data to display  EKG  EKG Interpretation None       Radiology No results found.  Procedures Procedures (including critical care time)  Medications Ordered in ED Medications - No data to display   Initial Impression / Assessment and Plan / ED Course  I have reviewed the triage vital signs and the nursing notes.  Pertinent labs & imaging results that were available during my care of the patient were reviewed by me and considered in my medical decision making (see chart for details).     He is presents to ED for evaluation of eczema flareup. She reports similar episodes in the past which have  resolved with prednisone and sensation of shortness. She denies any fever, chills, signs of severe allergic reaction. Physical exam there are areas of scaly and dry skin on palms and bottom of the foot. She is nontoxic appearing is on acute distress. Pt has a patent airway without stridor and is handling secretions without difficulty; no angioedema. No blisters, no pustules, no warmth, no draining sinus tracts, no superficial abscesses, no bullous impetigo, no vesicles, no desquamation, no target lesions with dusky purpura or a central bulla. Not tender to touch. No concern for superimposed infection. No concern for SJS, TEN, TSS, tick borne illness, syphilis or other life-threatening condition.  We'll treat with prednisone taper encouraged patient to follow up with dermatologist for further evaluation if symptoms persist. We'll consult social work. Patient appears stable for discharge at this time. Strict return precautions given.  Final Clinical Impressions(s) / ED Diagnoses   Final diagnoses:  Eczema, unspecified type    New Prescriptions Current Discharge Medication List       Dietrich Pates, Cordelia Poche 01/07/17  1253    Cathren LaineSteinl, Kevin, MD 01/07/17 1311

## 2017-01-07 NOTE — ED Notes (Signed)
Given in fo for community health and  wellsness

## 2017-01-07 NOTE — ED Triage Notes (Signed)
Pt endorses eczema flare up that began 6 days ago on both arms, and left foot. Pt has hx of same. Pt tachy in triage.

## 2017-01-12 ENCOUNTER — Ambulatory Visit: Payer: Self-pay | Admitting: Family Medicine

## 2017-01-13 ENCOUNTER — Other Ambulatory Visit (INDEPENDENT_AMBULATORY_CARE_PROVIDER_SITE_OTHER): Payer: Self-pay | Admitting: Family Medicine

## 2017-01-13 ENCOUNTER — Encounter: Payer: Self-pay | Admitting: Family Medicine

## 2017-01-13 ENCOUNTER — Ambulatory Visit (INDEPENDENT_AMBULATORY_CARE_PROVIDER_SITE_OTHER): Payer: Medicaid Other | Admitting: Family Medicine

## 2017-01-13 VITALS — BP 140/72 | HR 100 | Temp 98.7°F | Resp 16 | Ht 62.0 in | Wt 120.6 lb

## 2017-01-13 DIAGNOSIS — L409 Psoriasis, unspecified: Secondary | ICD-10-CM | POA: Diagnosis not present

## 2017-01-13 MED ORDER — DEXAMETHASONE SODIUM PHOSPHATE 10 MG/ML IJ SOLN
20.0000 mg | Freq: Once | INTRAMUSCULAR | Status: AC
  Administered 2017-01-13: 20 mg via INTRAMUSCULAR

## 2017-01-13 MED ORDER — RANITIDINE HCL 150 MG PO TABS: 150.0000 mg | ORAL_TABLET | Freq: Two times a day (BID) | ORAL | 1 refills | Status: DC

## 2017-01-13 MED ORDER — TRIAMCINOLONE ACETONIDE 0.1 % EX CREA: 1.0000 "application " | TOPICAL_CREAM | Freq: Two times a day (BID) | CUTANEOUS | 2 refills | Status: DC

## 2017-01-13 MED ORDER — HYDROXYZINE HCL 25 MG PO TABS: 25.0000 mg | ORAL_TABLET | Freq: Three times a day (TID) | ORAL | 0 refills | Status: DC | PRN

## 2017-01-13 MED ORDER — PREDNISONE 10 MG PO TABS: 10.0000 mg | ORAL_TABLET | Freq: Every day | ORAL | 0 refills | Status: DC

## 2017-01-13 MED FILL — ?PREDNISONE 10 MG TABLET: 10 | 30 days supply | Qty: 30 | Fill #0

## 2017-01-13 MED FILL — hydrOXYzine HCL 25 MG TABS: 25 | 20 days supply | Qty: 60 | Fill #0

## 2017-01-13 MED FILL — CYCLOBENZAPRINE 10 MG TAB: 10 | 10 days supply | Qty: 30 | Fill #0

## 2017-01-13 MED FILL — TRIAMCINOLONE 0.1% CREAM: 0.1 | 30 days supply | Qty: 454 | Fill #0

## 2017-01-13 MED FILL — raNITIdine HCL 150 MG TABS: 150 | 30 days supply | Qty: 60 | Fill #0

## 2017-01-13 NOTE — Patient Instructions (Addendum)
Start prednisone 10 mg daily with breakfast after you complete your current taper.  Apply triamcinolone cream twice daily to affected areas. Take ranitidine 150 mg twice daily and Hydroxyzine 25 mg every 8 hours as needed for skin irritation. Please complete the [facility] assistance application in order to be referred to dermatology.   Psoriasis Psoriasis is a long-term (chronic) skin condition. It causes raised, red patches (plaques) on your skin that look silvery. The red patches may show up anywhere on your body. They can be any size or shape. Psoriasis can come and go. It can range from mild to very bad. It cannot be passed from one person to another (not contagious). There is no cure for this condition, but it can be helped with treatment. Follow these instructions at home: Skin Care  Apply moisturizers to your skin as needed. Only use those that your doctor has said are okay.  Apply cool compresses to the affected areas.  Do not scratch your skin. Lifestyle   Do not use tobacco products. This includes cigarettes, chewing tobacco, and e-cigarettes. If you need help quitting, ask your doctor.  Drink little or no alcohol.  Try to lower your stress. Meditation or yoga may help.  Get sun as told by your doctor. Do not get sunburned.  Think about joining a psoriasis support group. Medicines  Take or use over-the-counter and prescription medicines only as told by your doctor.  If you were prescribed an antibiotic, take or use it as told by your doctor. Do not stop taking the antibiotic even if your condition starts to get better. General instructions  Keep a journal. Use this to help track what triggers an outbreak. Try to avoid any triggers.  See a counselor or social worker if you feel very sad, upset, or hopeless about your condition and these feelings affect your work or relationships.  Keep all follow-up visits as told by your doctor. This is important. Contact  a doctor if:  Your pain gets worse.  You have more redness or warmth in the affected areas.  You have new pain or stiffness in your joints.  Your pain or stiffness in your joints gets worse.  Your nails start to break easily.  Your nails pull away from the nail bed easily.  You have a fever.  You feel very sad (depressed). This information is not intended to replace advice given to you by your health care provider. Make sure you discuss any questions you have with your health care provider. Document Released: 07/02/2004 Document Revised: 10/31/2015 Document Reviewed: 10/10/2014 Elsevier Interactive Patient Education  2018 [employer]

## 2017-01-13 NOTE — Progress Notes (Signed)
Patient ID: Lynn Black, female    DOB: [birth year], 56 y.o.   MRN: [id number]  PCP: Bing Neighbors, FNP  Chief Complaint  Patient presents with   Hospitalization Follow-up    [facility]    Subjective:  HPI Lynn Black is a 56 y.o. female presents for evaluation of persisting chronic skin rash. Medical history consists of chronic neck pain, skin rash, and tobacco use. Shirleyann reports a history of intermittent flare of up of rash localized to her arms, back, and upper neck region. She reports that she was told in the past several years ago by a dermatologist that the rash appears related to her environmental allergies. She has been treated in the past with oral and intravenous steroids which in the past has resolved symptoms. However after she complete course of prednisone, lesions reappear. Dasha describes rash as  non-itching, scaling and some circular, erythematous lesions mostly localized to anterior back. Akila is also concern regarding molted skin color on bilateral arms. Denies any prior diagnosis of psoriasis or eczema skin conditions. Although she was recently seen and evaluated at [facility] Emergency Department for the same complaint and treated with prednisone.  She has now completed her course of prednisone and reports that the rash is returning. Denies any associated fever, chest pain, shortness of breath, nausea, vomiting, diarrhea, or headaches.  Social History   Social History   Marital status: Divorced    Spouse name: N/A   Number of children: N/A   Years of education: N/A   Occupational History   Not on file.   Social History Main Topics   Smoking status: Current Every Day Smoker    Packs/day: 0.50    Years: 30.00    Types: Cigarettes   Smokeless tobacco: Never Used   Alcohol use 1.2 oz/week    2 Glasses of wine per week   Drug use: No   Sexual activity: Not on file   Other Topics Concern   Not on file   Social History Narrative   No narrative  on file    Family History  Problem Relation Age of Onset   Family history unknown: Yes   Review of Systems See HPI Patient Active Problem List   Diagnosis Date Noted   Cervical arthritis with myelopathy 10/26/2016    Allergies  Allergen Reactions   Nickel Other (See Comments)    Unknown   Shellfish Allergy Other (See Comments)   Sulfa Antibiotics Other (See Comments)    Unknown   Other Other (See Comments)    Tomatoes, onions, italian   Latex Hives and Rash   Neosporin [Neomycin-Bacitracin Zn-Polymyx] Rash    Prior to Admission medications   Medication Sig Start Date End Date Taking? Authorizing Provider  cyclobenzaprine (FLEXERIL) 10 MG tablet Take 10 mg by mouth 3 (three) times daily as needed for muscle spasms.   Yes [provider]  predniSONE (DELTASONE) 10 MG tablet 6,6,5,5,4,4,3,3,2,2,1,1 taper 01/07/17  Yes Khatri, Hina, PA-C  diazepam (VALIUM) 5 MG tablet Take 1 tablet (5 mg total) by mouth every 8 (eight) hours as needed for muscle spasms. Patient not taking: Reported on 01/13/2017 10/27/16 10/27/17  Barnett Abu, MD  diphenhydrAMINE (BENADRYL) 25 MG tablet Take 25 mg by mouth 2 (two) times daily as needed.    [provider]  gabapentin (NEURONTIN) 100 MG capsule Take 1 capsule (100 mg total) by mouth 3 (three) times daily. Patient not taking: Reported on 01/13/2017 10/18/16   Raeford Razor,  MD  HYDROcodone-acetaminophen (NORCO/VICODIN) 5-325 MG tablet Take 1-2 tablets by mouth every 4 (four) hours as needed (breakthrough pain). Patient not taking: Reported on 01/13/2017 10/27/16   Barnett AbuElsner, Henry, MD  ketoconazole (NIZORAL) 2 % cream Apply 1 application topically daily. Patient not taking: Reported on 07/26/2016 06/20/16   Mathews RobinsonsMitchell, Jessica B, PA-C  triamcinolone cream (KENALOG) 0.1 % Apply 1 application topically 2 (two) times daily. Patient not taking: Reported on 10/22/2016 07/26/16   Nira Connardama, Pedro Eduardo, MD    Past Medical, Surgical Family and  Social History reviewed and updated.    Objective:   Today's Vitals   01/13/17 1307  Weight: 120 lb 9.6 oz (54.7 kg)  Height: 5\' 2"  (1.575 m)    Wt Readings from Last 3 Encounters:  01/13/17 120 lb 9.6 oz (54.7 kg)  01/07/17 110 lb (49.9 kg)  12/16/16 110 lb (49.9 kg)   Physical Exam  Constitutional: She is oriented to person, place, and time. She appears well-developed and well-nourished.  HENT:  Head: Normocephalic and atraumatic.  Right Ear: External ear normal.  Left Ear: External ear normal.  Eyes: Pupils are equal, round, and reactive to light. Conjunctivae and EOM are normal.  Neck: Normal range of motion. Neck supple.  Cardiovascular: Normal rate, regular rhythm, normal heart sounds and intact distal pulses.   Pulmonary/Chest: Effort normal and breath sounds normal.  Neurological: She is alert and oriented to person, place, and time.  Skin: Skin is warm and dry. Lesion noted. There is erythema.  Bilateral hands erythematous with generalized desquamation of dermal layer of skin Posterior back, multiple plaques with erythematous boarders, middle section scaly and red. Multiple dry patches of skin bilateral arms  Psychiatric: She has a normal mood and affect. Her behavior is normal. Judgment and thought content normal.   Assessment & Plan:  1. Psoriasis - dexamethasone (DECADRON) injection 20 mg; Inject 2 mLs (20 mg total) into the muscle once. -Prednisone 10 mg on daily with breakfast x 1 months -Triamcinolone cream twice daily to affected areas -Ranitidine 150 mg twice daily and hydroxyzine 25 mg as needed for skin irritation. -Patient given [place] Patient Assistance Application to complete in order to obtain a dermatology referral.  RTC: 1 month for follow-up of psoriasis   Godfrey PickKimberly S. Tiburcio PeaHarris, MSN, FNP-C [facility]  [address] [place], [place] [address] [phone]

## 2017-01-25 ENCOUNTER — Ambulatory Visit (INDEPENDENT_AMBULATORY_CARE_PROVIDER_SITE_OTHER): Payer: Self-pay

## 2017-01-25 ENCOUNTER — Other Ambulatory Visit (INDEPENDENT_AMBULATORY_CARE_PROVIDER_SITE_OTHER): Payer: Self-pay

## 2017-01-28 NOTE — Addendum Note (Signed)
Addendum  created 01/28/17 1452 by Kipp Brood, MD   Sign clinical note

## 2017-02-03 MED FILL — CYCLOBENZAPRINE 10 MG TAB: 10 | 10 days supply | Qty: 30 | Fill #1

## 2017-02-10 ENCOUNTER — Ambulatory Visit (INDEPENDENT_AMBULATORY_CARE_PROVIDER_SITE_OTHER): Payer: Medicaid Other | Admitting: Family Medicine

## 2017-02-10 ENCOUNTER — Encounter: Payer: Self-pay | Admitting: Family Medicine

## 2017-02-10 VITALS — BP 140/77 | HR 98 | Temp 98.1°F | Resp 14 | Ht 62.0 in | Wt 118.2 lb

## 2017-02-10 DIAGNOSIS — L409 Psoriasis, unspecified: Secondary | ICD-10-CM | POA: Diagnosis not present

## 2017-02-10 NOTE — Progress Notes (Signed)
Patient ID: Lynn MaLaura J Oman, female    DOB: 01/17/1961, 56 y.o.   MRN: [id number]  PCP: Bing NeighborsHarris, Brigham Cobbins S, FNP  Chief Complaint  Patient presents with   Psoriasis    4 WEEK FOLLOW UP    Subjective:  HPI Lynn Black is a 56 y.o. female presents for evaluation of psoriasis. Vernona RiegerLaura recently established care and was seen and evaluated for rash which was consistent with psoriasis on her lower back and a scaling rash of the bilateral hands. She was treated with a high dose prednisone taper, then prescribed a low dose of chronic prednisone to continue and until she could be evaluated by dermatologist. Vernona RiegerLaura reports improvement of rash and itching, although she continues to experience redness and mild itching. Magdalynn's hands continue to excoriated and scaly. She has [facility] coverage through [facility] now and requests a referral to dermatologist. She has no other complaints today. Social History   Social History   Marital status: Divorced    Spouse name: N/A   Number of children: N/A   Years of education: N/A   Occupational History   Not on file.   Social History Main Topics   Smoking status: Current Every Day Smoker    Packs/day: 0.50    Years: 30.00    Types: Cigarettes   Smokeless tobacco: Never Used   Alcohol use 1.2 oz/week    2 Glasses of wine per week   Drug use: No   Sexual activity: Not on file   Other Topics Concern   Not on file   Social History Narrative   No narrative on file    Family History  Problem Relation Age of Onset   Family history unknown: Yes   Review of Systems See HPI  Patient Active Problem List   Diagnosis Date Noted   Cervical arthritis with myelopathy 10/26/2016    Allergies  Allergen Reactions   Nickel Other (See Comments)    Unknown   Shellfish Allergy Other (See Comments)   Sulfa Antibiotics Other (See Comments)    Unknown   Other Other (See Comments)    Tomatoes, onions, italian   Latex Hives and Rash    Neosporin [Neomycin-Bacitracin Zn-Polymyx] Rash    Prior to Admission medications   Medication Sig Start Date End Date Taking? Authorizing Provider  cyclobenzaprine (FLEXERIL) 10 MG tablet Take 10 mg by mouth 3 (three) times daily as needed for muscle spasms.   Yes [provider]  diazepam (VALIUM) 5 MG tablet Take 1 tablet (5 mg total) by mouth every 8 (eight) hours as needed for muscle spasms. 10/27/16 10/27/17 Yes Barnett AbuElsner, Henry, MD  diphenhydrAMINE (BENADRYL) 25 MG tablet Take 25 mg by mouth 2 (two) times daily as needed.   Yes [provider]  gabapentin (NEURONTIN) 100 MG capsule Take 1 capsule (100 mg total) by mouth 3 (three) times daily. 10/18/16  Yes Raeford RazorKohut, Stephen, MD  hydrOXYzine (ATARAX/VISTARIL) 25 MG tablet Take 1 tablet (25 mg total) by mouth every 8 (eight) hours as needed for itching (Rash). 01/13/17  Yes Bing NeighborsHarris, Cotey Rakes S, FNP  ketoconazole (NIZORAL) 2 % cream Apply 1 application topically daily. 06/20/16  Yes Mathews RobinsonsMitchell, Jessica B, PA-C  predniSONE (DELTASONE) 10 MG tablet Take 1 tablet (10 mg total) by mouth daily with breakfast. Start after completion of prednisone taper. 01/13/17  Yes Bing NeighborsHarris, Shamyah Stantz S, FNP  ranitidine (ZANTAC) 150 MG tablet Take 1 tablet (150 mg total) by mouth 2 (two) times daily. 01/13/17  Yes Joaquin CourtsHarris, Kristina Bertone  S, FNP  triamcinolone cream (KENALOG) 0.1 % Apply 1 application topically 2 (two) times daily. 07/26/16  Yes Cardama, Amadeo Garnet, MD  HYDROcodone-acetaminophen (NORCO/VICODIN) 5-325 MG tablet Take 1-2 tablets by mouth every 4 (four) hours as needed (breakthrough pain). Patient not taking: Reported on 01/13/2017 10/27/16   Barnett Abu, MD  predniSONE (DELTASONE) 10 MG tablet 6,6,5,5,4,4,3,3,2,2,1,1 taper Patient not taking: Reported on 02/10/2017 01/07/17   Dietrich Pates, PA-C  triamcinolone cream (KENALOG) 0.1 % Apply 1 application topically 2 (two) times daily. 01/13/17   Bing Neighbors, FNP    Past Medical, Surgical Family and Social  History reviewed and updated.    Objective:   Today's Vitals   02/10/17 1300  BP: 140/77  Pulse: 98  Resp: 14  Temp: 98.1 F (36.7 C)  TempSrc: Oral  SpO2: 100%  Weight: 118 lb 3.2 oz (53.6 kg)  Height: 5\' 2"  (1.575 m)    Wt Readings from Last 3 Encounters:  02/10/17 118 lb 3.2 oz (53.6 kg)  01/13/17 120 lb 9.6 oz (54.7 kg)  01/07/17 110 lb (49.9 kg)   Physical Exam  Constitutional: She is oriented to person, place, and time. She appears well-developed and well-nourished.  HENT:  Head: Normocephalic and atraumatic.  Neck: Normal range of motion.  Cardiovascular: Normal rate, regular rhythm, normal heart sounds and intact distal pulses.   Musculoskeletal: Normal range of motion.  Neurological: She is alert and oriented to person, place, and time.  Skin: Skin is warm and dry. Rash noted. There is erythema.  Erythremic/Scaley annular lesions on posterior back and lower abdomen. Diffuse scaling and cracking of skin-palm surface of bilateral hands  Psychiatric: She has a normal mood and affect. Her behavior is normal. Judgment and thought content normal.   Assessment & Plan:  1. Psoriasis - Ambulatory referral to Dermatology -Continue prednisone 10 mg once daily with food. -Continue triamcinolone cream BID  -Wear gloves when hands are exposed to water or extreme changes in temperature. -Keep skin moisturized  RTC: 3 months for wellness visit , CPE needed     Godfrey Pick. Tiburcio Pea, MSN, FNP-C [facility]  [address] [place], [place] [address] [phone]

## 2017-02-14 ENCOUNTER — Telehealth: Payer: Self-pay | Admitting: Family Medicine

## 2017-02-14 NOTE — Telephone Encounter (Signed)
Please process referral for dermat[facility] at [facility]. Office note completed.  Godfrey PickKimberly S. Tiburcio PeaHarris, MSN, FNP-C [facility]  [address] [place], [place] [address] [phone]

## 2017-02-15 NOTE — Telephone Encounter (Signed)
Referral faxed to [facility] on 02/15/2017

## 2017-02-17 MED FILL — CYCLOBENZAPRINE 10 MG TAB: 10 | 16 days supply | Qty: 50 | Fill #0

## 2017-02-22 ENCOUNTER — Telehealth: Payer: Self-pay

## 2017-02-22 NOTE — Telephone Encounter (Signed)
Left patient information for [facility] and let her know that referral was sent on 02/15/2017

## 2017-03-08 MED FILL — TRIAMCINOLONE ACETONIDE 0.1: 0.1 | 30 days supply | Qty: 454 | Fill #1

## 2017-03-08 MED FILL — raNITIdine HCL 150 MG TABS: 150 | 30 days supply | Qty: 60 | Fill #1

## 2017-03-08 MED FILL — CYCLOBENZAPRINE 10 MG TAB: 10 | 16 days supply | Qty: 50 | Fill #1

## 2017-03-10 ENCOUNTER — Ambulatory Visit (INDEPENDENT_AMBULATORY_CARE_PROVIDER_SITE_OTHER): Payer: Medicaid Other | Admitting: Family Medicine

## 2017-03-10 ENCOUNTER — Encounter: Payer: Self-pay | Admitting: Family Medicine

## 2017-03-10 VITALS — BP 136/69 | HR 90 | Temp 98.2°F | Resp 14 | Ht 62.0 in | Wt 122.0 lb

## 2017-03-10 DIAGNOSIS — L409 Psoriasis, unspecified: Secondary | ICD-10-CM

## 2017-03-10 MED ORDER — PREDNISONE 20 MG PO TABS: ORAL_TABLET | ORAL | 0 refills | Status: AC

## 2017-03-10 MED ORDER — HYDROXYZINE HCL 25 MG PO TABS: 25.0000 mg | ORAL_TABLET | Freq: Three times a day (TID) | ORAL | 0 refills | Status: DC | PRN

## 2017-03-10 MED ORDER — DEXAMETHASONE SODIUM PHOSPHATE 100 MG/10ML IJ SOLN
20.0000 mg | Freq: Once | INTRAMUSCULAR | Status: AC
  Administered 2017-03-10: 20 mg via INTRAMUSCULAR

## 2017-03-10 MED FILL — hydrOXYzine HCL 25 MG TABS: 25 | 20 days supply | Qty: 60 | Fill #0

## 2017-03-10 MED FILL — predniSONE 20 MG TABS: 20 | 30 days supply | Qty: 30 | Fill #0

## 2017-03-10 NOTE — Progress Notes (Signed)
Patient ID: Lynn Black, female    DOB: Jun 22, 1960, 56 y.o.   MRN: [id number]  PCP: Bing Neighbors, FNP  Chief Complaint  Patient presents with   Rash    on hands    Subjective:  HPI Lynn Black is a 56 y.o. female presents for evaluation of psoriasis rash. Lynn Black has been seen in clinic for the same complaint previously 02/10/2017. She suffers from severe psoriasis of the hands, abdomen, and back. During that visit she was referred to dermatology, treated with chronic low dose prednisone ,and steroidal cream. She reports never resuming the low dose prednisone and psoriasis lesion that had previously resolved, have reoccurred. Lynn Black has an upcoming appointment with [facility] in November.  Social History   Social History   Marital status: Divorced    Spouse name: N/A   Number of children: N/A   Years of education: N/A   Occupational History   Not on file.   Social History Main Topics   Smoking status: Current Every Day Smoker    Packs/day: 0.50    Years: 30.00    Types: Cigarettes   Smokeless tobacco: Never Used   Alcohol use 1.2 oz/week    2 Glasses of wine per week   Drug use: No   Sexual activity: Not on file   Other Topics Concern   Not on file   Social History Narrative   No narrative on file    Family History  Problem Relation Age of Onset   Family history unknown: Yes   Review of Systems See history of present illness Patient Active Problem List   Diagnosis Date Noted   Cervical arthritis with myelopathy 10/26/2016    Allergies  Allergen Reactions   Nickel Other (See Comments)    Unknown   Shellfish Allergy Other (See Comments)   Sulfa Antibiotics Other (See Comments)    Unknown   Other Other (See Comments)    Tomatoes, onions, italian   Latex Hives and Rash   Neosporin [Neomycin-Bacitracin Zn-Polymyx] Rash    Prior to Admission medications   Medication Sig Start Date End Date Taking? Authorizing Provider   cyclobenzaprine (FLEXERIL) 10 MG tablet Take 10 mg by mouth 3 (three) times daily as needed for muscle spasms.    [provider]  diazepam (VALIUM) 5 MG tablet Take 1 tablet (5 mg total) by mouth every 8 (eight) hours as needed for muscle spasms. 10/27/16 10/27/17  Barnett Abu, MD  diphenhydrAMINE (BENADRYL) 25 MG tablet Take 25 mg by mouth 2 (two) times daily as needed.    [provider]  gabapentin (NEURONTIN) 100 MG capsule Take 1 capsule (100 mg total) by mouth 3 (three) times daily. 10/18/16   Raeford Razor, MD  HYDROcodone-acetaminophen (NORCO/VICODIN) 5-325 MG tablet Take 1-2 tablets by mouth every 4 (four) hours as needed (breakthrough pain). Patient not taking: Reported on 01/13/2017 10/27/16   Barnett Abu, MD  hydrOXYzine (ATARAX/VISTARIL) 25 MG tablet Take 1 tablet (25 mg total) by mouth every 8 (eight) hours as needed for itching (Rash). 01/13/17   Bing Neighbors, FNP  ketoconazole (NIZORAL) 2 % cream Apply 1 application topically daily. 06/20/16   Georgiana Shore, PA-C  predniSONE (DELTASONE) 10 MG tablet 6,6,5,5,4,4,3,3,2,2,1,1 taper Patient not taking: Reported on 02/10/2017 01/07/17   Dietrich Pates, PA-C  predniSONE (DELTASONE) 10 MG tablet Take 1 tablet (10 mg total) by mouth daily with breakfast. Start after completion of prednisone taper. 01/13/17   Bing Neighbors, FNP  ranitidine (ZANTAC) 150 MG tablet Take 1 tablet (150 mg total) by mouth 2 (two) times daily. 01/13/17   Bing Neighbors, FNP  triamcinolone cream (KENALOG) 0.1 % Apply 1 application topically 2 (two) times daily. 07/26/16   Nira Conn, MD  triamcinolone cream (KENALOG) 0.1 % Apply 1 application topically 2 (two) times daily. 01/13/17   Bing Neighbors, FNP    Past Medical, Surgical Family and Social History reviewed and updated.    Objective:  There were no vitals filed for this visit.  Wt Readings from Last 3 Encounters:  02/10/17 118 lb 3.2 oz (53.6 kg)  01/13/17 120 lb  9.6 oz (54.7 kg)  01/07/17 110 lb (49.9 kg)    Physical Exam  Cardiovascular: Normal rate, regular rhythm, normal heart sounds and intact distal pulses.   Pulmonary/Chest: Effort normal and breath sounds normal.  Psychiatric: Lynn Black mood appears anxious.  Skin: Skin is warm and dry. Lesion noted. There is erythema.  Bilateral hands erythematous with generalized desquamation of dermal layer of skin Posterior back, multiple plaques with erythematous boarders, middle section scaly and red. Multiple dry patches of skin bilateral arms   Assessment & Plan:  1. Psoriasis, exacerbation - dexamethasone (DECADRON) injection 20 mg; Inject 2 mLs (20 mg total) into the muscle once. -Prednisone 20 mg daily with breakfast for 14 days then half a tab 10 mg once daily until your follow-up with dermatology. -Continue management of itching with hydroxyzine. -And treat inflammation of hands with triamcinolone cream.   Return for care in 6 months    Godfrey Pick. Tiburcio Pea, MSN, FNP-C [facility]  [address] [place], [place] [address] [phone]

## 2017-03-10 NOTE — Patient Instructions (Signed)
Resume hydroxyzine. Resume prednisone 20 mg daily with breakfast 14 days, then 1/2 tablet 10 mg once daily until you follow-up with dermatology. Continue triamcinolone cream.     Psoriasis Psoriasis is a long-term (chronic) skin condition. It causes raised, red patches (plaques) on your skin that look silvery. The red patches may show up anywhere on your body. They can be any size or shape. Psoriasis can come and go. It can range from mild to very bad. It cannot be passed from one person to another (not contagious). There is no cure for this condition, but it can be helped with treatment. Follow these instructions at home: Skin Care  Apply moisturizers to your skin as needed. Only use those that your doctor has said are okay.  Apply cool compresses to the affected areas.  Do not scratch your skin. Lifestyle   Do not use tobacco products. This includes cigarettes, chewing tobacco, and e-cigarettes. If you need help quitting, ask your doctor.  Drink little or no alcohol.  Try to lower your stress. Meditation or yoga may help.  Get sun as told by your doctor. Do not get sunburned.  Think about joining a psoriasis support group. Medicines  Take or use over-the-counter and prescription medicines only as told by your doctor.  If you were prescribed an antibiotic, take or use it as told by your doctor. Do not stop taking the antibiotic even if your condition starts to get better. General instructions  Keep a journal. Use this to help track what triggers an outbreak. Try to avoid any triggers.  See a counselor or social worker if you feel very sad, upset, or hopeless about your condition and these feelings affect your work or relationships.  Keep all follow-up visits as told by your doctor. This is important. Contact a doctor if:  Your pain gets worse.  You have more redness or warmth in the affected areas.  You have new pain or stiffness in your joints.  Your pain or  stiffness in your joints gets worse.  Your nails start to break easily.  Your nails pull away from the nail bed easily.  You have a fever.  You feel very sad (depressed). This information is not intended to replace advice given to you by your health care provider. Make sure you discuss any questions you have with your health care provider. Document Released: 07/02/2004 Document Revised: 10/31/2015 Document Reviewed: 10/10/2014 Elsevier Interactive Patient Education  2018 [employer].

## 2017-03-16 DIAGNOSIS — L409 Psoriasis, unspecified: Secondary | ICD-10-CM | POA: Insufficient documentation

## 2017-03-24 MED FILL — IBUPROFEN 800 MG TABLET: 800 | 5 days supply | Qty: 16 | Fill #0

## 2017-04-06 MED FILL — CYCLOBENZAPRINE 10 MG TAB: 10 | 16 days supply | Qty: 50 | Fill #2

## 2017-04-07 MED FILL — AMOXICILLIN 500 MG CAPSULE: 500 | 7 days supply | Qty: 21 | Fill #0

## 2017-04-08 ENCOUNTER — Telehealth: Payer: Self-pay

## 2017-04-09 MED ORDER — RANITIDINE HCL 150 MG PO TABS: 150.0000 mg | ORAL_TABLET | Freq: Two times a day (BID) | ORAL | 1 refills | Status: DC

## 2017-04-09 NOTE — Telephone Encounter (Signed)
[facility]

## 2017-04-22 DIAGNOSIS — L309 Dermatitis, unspecified: Secondary | ICD-10-CM | POA: Diagnosis not present

## 2017-05-03 ENCOUNTER — Telehealth: Payer: Self-pay

## 2017-05-03 MED ORDER — RANITIDINE HCL 150 MG PO TABS: 150.0000 mg | ORAL_TABLET | Freq: Two times a day (BID) | ORAL | 1 refills | Status: DC

## 2017-05-03 MED FILL — TRIAMCINOLONE 0.1% CREAM: 0.1 | 30 days supply | Qty: 454 | Fill #2

## 2017-05-03 MED FILL — CYCLOBENZAPRINE 10 MG TAB: 10 | 16 days supply | Qty: 50 | Fill #3

## 2017-05-03 NOTE — Telephone Encounter (Signed)
Medication sent to pharmacy

## 2017-05-04 MED FILL — raNITIdine HCL 150 MG TABS: 150 | 30 days supply | Qty: 60 | Fill #0

## 2017-05-12 ENCOUNTER — Ambulatory Visit: Payer: Medicaid Other | Admitting: Family Medicine

## 2017-05-21 ENCOUNTER — Encounter: Payer: Self-pay | Admitting: Family Medicine

## 2017-05-21 ENCOUNTER — Ambulatory Visit (INDEPENDENT_AMBULATORY_CARE_PROVIDER_SITE_OTHER): Payer: Medicaid Other | Admitting: Family Medicine

## 2017-05-21 NOTE — Progress Notes (Signed)
Patient came in office for evaluation of psoriasis for which she is actively being treated by dermatology at [facility]. Advised patient to follow-up with dermatology regarding medication they recently prescribed that she feels is not working. Advised patient that it would be inappropriate for me to interfere with the dermatologist current treatment plan. Patient will contact dermatologist and will not be charged for today's visit.   Godfrey PickKimberly S. Tiburcio PeaHarris, MSN, FNP-C [facility]  [address] [place], [place] [address] [phone]

## 2017-05-26 MED FILL — CYCLOBENZAPRINE 10 MG TAB: 10 | 16 days supply | Qty: 50 | Fill #4

## 2017-05-26 MED FILL — BETAMETHASONE VALER 0.1% OI: 0.1 | 30 days supply | Qty: 45 | Fill #0

## 2017-06-02 ENCOUNTER — Other Ambulatory Visit: Payer: Self-pay | Admitting: Family Medicine

## 2017-06-02 MED FILL — TRIAMCINOLONE 0.1% CREAM: 0.1 | 30 days supply | Qty: 454 | Fill #0

## 2017-06-18 MED FILL — CYCLOBENZAPRINE 10 MG TAB: 10 | 16 days supply | Qty: 50 | Fill #5

## 2017-06-23 MED FILL — raNITIdine HCL 150 MG TABS: 150 | 30 days supply | Qty: 60 | Fill #0

## 2017-06-29 MED FILL — TRIAMCINOLONE ACETONIDE 0.1: 0.1 | 30 days supply | Qty: 454 | Fill #1

## 2017-07-13 DIAGNOSIS — H04123 Dry eye syndrome of bilateral lacrimal glands: Secondary | ICD-10-CM | POA: Diagnosis not present

## 2017-07-13 DIAGNOSIS — H2513 Age-related nuclear cataract, bilateral: Secondary | ICD-10-CM | POA: Diagnosis not present

## 2017-07-13 DIAGNOSIS — H524 Presbyopia: Secondary | ICD-10-CM | POA: Diagnosis not present

## 2017-07-28 MED FILL — raNITIdine HCL 150 MG TABS: 150 | 30 days supply | Qty: 60 | Fill #1

## 2017-07-28 MED FILL — TRIAMCINOLONE ACETONIDE 0.1: 0.1 | 30 days supply | Qty: 454 | Fill #2

## 2017-10-15 ENCOUNTER — Encounter: Payer: Self-pay | Admitting: Family Medicine

## 2017-10-15 ENCOUNTER — Other Ambulatory Visit: Payer: Self-pay | Admitting: Family Medicine

## 2017-10-15 ENCOUNTER — Ambulatory Visit (INDEPENDENT_AMBULATORY_CARE_PROVIDER_SITE_OTHER): Payer: Medicaid Other | Admitting: Family Medicine

## 2017-10-15 VITALS — BP 122/74 | HR 105 | Temp 98.6°F | Resp 16 | Ht 62.0 in | Wt 119.0 lb

## 2017-10-15 DIAGNOSIS — L409 Psoriasis, unspecified: Secondary | ICD-10-CM

## 2017-10-15 DIAGNOSIS — F17200 Nicotine dependence, unspecified, uncomplicated: Secondary | ICD-10-CM

## 2017-10-15 DIAGNOSIS — M792 Neuralgia and neuritis, unspecified: Secondary | ICD-10-CM

## 2017-10-15 DIAGNOSIS — L299 Pruritus, unspecified: Secondary | ICD-10-CM | POA: Diagnosis not present

## 2017-10-15 MED ORDER — PREDNISONE 10 MG PO TABS: 10.0000 mg | ORAL_TABLET | Freq: Every day | ORAL | 0 refills | Status: AC

## 2017-10-15 MED ORDER — GABAPENTIN 100 MG PO CAPS
100.0000 mg | ORAL_CAPSULE | Freq: Three times a day (TID) | ORAL | 0 refills | Status: DC
Start: 2017-10-15 — End: 2020-10-14

## 2017-10-15 MED ORDER — HYDROXYZINE HCL 25 MG PO TABS: 25.0000 mg | ORAL_TABLET | Freq: Four times a day (QID) | ORAL | 1 refills | Status: DC | PRN

## 2017-10-15 MED ORDER — GABAPENTIN 100 MG PO CAPS
100.0000 mg | ORAL_CAPSULE | Freq: Three times a day (TID) | ORAL | 0 refills | Status: DC
Start: 2017-10-15 — End: 2017-10-15

## 2017-10-15 MED ORDER — MOMETASONE FUROATE 0.1 % EX OINT: TOPICAL_OINTMENT | Freq: Every day | CUTANEOUS | 2 refills | Status: DC

## 2017-10-15 NOTE — Patient Instructions (Addendum)
Will continue Prednisone 10 mg daily for 15 days.  Will start a trial of 0.1 % mometasone twice daily Hydroxyzine 25 mg every 6 hours as needed for itching.   Sent an urgent referral to dermatology I suggest a gluten free diet to minimize food allergies Will follow up by phone with any abnormal laboratory results   Psoriasis Psoriasis is a long-term (chronic) skin condition. It causes raised, red patches (plaques) on your skin that look silvery. The red patches may show up anywhere on your body. They can be any size or shape. Psoriasis can come and go. It can range from mild to very bad. It cannot be passed from one person to another (not contagious). There is no cure for this condition, but it can be helped with treatment. Follow these instructions at home: Skin Care  Apply moisturizers to your skin as needed. Only use those that your doctor has said are okay.  Apply cool compresses to the affected areas.  Do not scratch your skin. Lifestyle   Do not use tobacco products. This includes cigarettes, chewing tobacco, and e-cigarettes. If you need help quitting, ask your doctor.  Drink little or no alcohol.  Try to lower your stress. Meditation or yoga may help.  Get sun as told by your doctor. Do not get sunburned.  Think about joining a psoriasis support group. Medicines  Take or use over-the-counter and prescription medicines only as told by your doctor.  If you were prescribed an antibiotic, take or use it as told by your doctor. Do not stop taking the antibiotic even if your condition starts to get better. General instructions  Keep a journal. Use this to help track what triggers an outbreak. Try to avoid any triggers.  See a counselor or social worker if you feel very sad, upset, or hopeless about your condition and these feelings affect your work or relationships.  Keep all follow-up visits as told by your doctor. This is important. Contact a doctor if:  Your pain  gets worse.  You have more redness or warmth in the affected areas.  You have new pain or stiffness in your joints.  Your pain or stiffness in your joints gets worse.  Your nails start to break easily.  Your nails pull away from the nail bed easily.  You have a fever.  You feel very sad (depressed). This information is not intended to replace advice given to you by your health care provider. Make sure you discuss any questions you have with your health care provider. Document Released: 07/02/2004 Document Revised: 10/31/2015 Document Reviewed: 10/10/2014 Elsevier Interactive Patient Education  2018 [employer].

## 2017-10-15 NOTE — Progress Notes (Signed)
Subjective:    Patient ID: Lynn Black, female    DOB: January 14, 1961, 57 y.o.   MRN: [id number]  HPI Jeroline Wolbert, a 57 year old female with a history of psoriasis, neuropathic pain, and tobacco dependence presents for follow-up of psoriasis. Symptoms have been present for many years.   Symptoms include rash,  numbness and pain and are of moderate and generalized severity. Onset of flare was 6 months. Patient has not identified palliative or provocative factors related to psoriasis She has not been followed by dermatology over the past several years due to insurance constraints. She characterized rash as generalized, itching, and occasionally painful. She is taking Benadryl tablets at bedtime to alleviated itching with minimal relief.   Past Medical History:  Diagnosis Date   Eczema    Heart murmur    " nothing to wory about"   Social History   Socioeconomic History   Marital status: Divorced    Spouse name: Not on file   Number of children: Not on file   Years of education: Not on file   Highest education level: Not on file  Occupational History   Not on file  Social Needs   Financial resource strain: Not on file   Food insecurity:    Worry: Not on file    Inability: Not on file   Transportation needs:    Medical: Not on file    Non-medical: Not on file  Tobacco Use   Smoking status: Current Every Day Smoker    Packs/day: 0.50    Years: 30.00    Pack years: 15.00    Types: Cigarettes   Smokeless tobacco: Never Used  Substance and Sexual Activity   Alcohol use: Yes    Alcohol/week: 1.2 oz    Types: 2 Glasses of wine per week   Drug use: No   Sexual activity: Not on file  Lifestyle   Physical activity:    Days per week: Not on file    Minutes per session: Not on file   Stress: Not on file  Relationships   Social connections:    Talks on phone: Not on file    Gets together: Not on file    Attends religious service: Not on file    Active member of  club or organization: Not on file    Attends meetings of clubs or organizations: Not on file    Relationship status: Not on file   Intimate partner violence:    Fear of current or ex partner: Not on file    Emotionally abused: Not on file    Physically abused: Not on file    Forced sexual activity: Not on file  Other Topics Concern   Not on file  Social History Narrative   Not on file    There is no immunization history on file for this patient.  Review of Systems  Constitutional: Negative for activity change, appetite change, fatigue, fever and unexpected weight change.  HENT: Negative.   Cardiovascular: Negative.   Gastrointestinal: Negative.   Endocrine: Negative.   Genitourinary: Negative.   Musculoskeletal: Negative for arthralgias.  Skin: Positive for rash.  Allergic/Immunologic: Positive for environmental allergies.  Neurological: Negative.   Hematological: Negative.   Psychiatric/Behavioral: Negative.        Objective:   Physical Exam  Constitutional: She is oriented to person, place, and time. She appears well-developed and well-nourished.  Eyes: Pupils are equal, round, and reactive to light.  Cardiovascular: Normal rate, regular rhythm, normal heart  sounds and intact distal pulses.  Pulmonary/Chest: Effort normal and breath sounds normal.  Abdominal: Soft. Bowel sounds are normal.  Neurological: She is alert and oriented to person, place, and time.  Skin: Rash noted.  Thickened, rough, dry, silvery scales   Psychiatric: She has a normal mood and affect. Her speech is normal and behavior is normal. Judgment normal.         BP 122/74 (BP Location: Right Arm, Patient Position: Sitting, Cuff Size: Normal) Comment: manually   Pulse (!) 105    Temp 98.6 F (37 C) (Oral)    Resp 16    Ht  (1.575 m)    Wt 119 lb (54 kg)    SpO2 100%    BMI 21.77 kg/m  Assessment & Plan:  1. Psoriasis Will start a trial of prednisone 10 mg tablets.  Sent an urgent  referral to dermatology for further workup and evaluation.  Will follow up by phone with any abnormal laboratory results - Sedimentation Rate - C-reactive protein - Rheumatoid factor - mometasone (ELOCON) 0.1 % ointment; Apply topically daily.  Dispense: 45 g; Refill: 2 - predniSONE (DELTASONE) 10 MG tablet; Take 1 tablet (10 mg total) by mouth daily with breakfast for 15 days.  Dispense: 15 tablet; Refill: 0 - Ambulatory referral to Dermatology  2. Tobacco dependence Smoking cessation instruction/counseling given:  counseled patient on the dangers of tobacco use, advised patient to stop smoking, and reviewed strategies to maximize success  3. Pruritus - hydrOXYzine (ATARAX/VISTARIL) 25 MG tablet; Take 1 tablet (25 mg total) by mouth every 6 (six) hours as needed for itching (Rash).  Dispense: 60 tablet; Refill: 1  4. Neuropathic pain - gabapentin (NEURONTIN) 100 MG capsule; Take 1 capsule (100 mg total) by mouth 3 (three) times daily.  Dispense: 90 capsule; Refill: 0   RTC; 6 months for chronic conditions  Nolon Nations  MSN, FNP-C Patient Care [facility] [address] [place], [place] [address] [phone]

## 2017-10-16 LAB — SEDIMENTATION RATE: SED RATE: 8 mm/h (ref 0–40)

## 2017-10-16 LAB — C-REACTIVE PROTEIN: CRP: 1.8 mg/L (ref 0.0–4.9)

## 2017-10-16 LAB — RHEUMATOID FACTOR: Rhuematoid fact SerPl-aCnc: 10 IU/mL (ref 0.0–13.9)

## 2017-10-18 ENCOUNTER — Telehealth: Payer: Self-pay

## 2017-10-18 ENCOUNTER — Other Ambulatory Visit: Payer: Self-pay

## 2017-10-18 MED ORDER — TRIAMCINOLONE ACETONIDE 0.1 % EX CREA: TOPICAL_CREAM | CUTANEOUS | 2 refills | Status: DC

## 2017-10-18 NOTE — Telephone Encounter (Signed)
-----   Message from Massie Maroon, [place] sent at 10/18/2017  5:03 AM EDT ----- Regarding: lab results Please inform patient that labs are within a normal range. No medication changes warranted at this time. Follow up in office as scheduled.   Nolon Nations  MSN, FNP-C Patient Care [facility] [address] [place], [place] [address] [phone]

## 2017-10-18 NOTE — Telephone Encounter (Signed)
Medication sent to pharmacy

## 2017-10-18 NOTE — Telephone Encounter (Signed)
Left a detailed vm with patient results

## 2017-10-21 DIAGNOSIS — L2089 Other atopic dermatitis: Secondary | ICD-10-CM | POA: Diagnosis not present

## 2018-01-17 ENCOUNTER — Ambulatory Visit: Payer: Medicaid Other | Admitting: Family Medicine

## 2018-04-06 DIAGNOSIS — M4712 Other spondylosis with myelopathy, cervical region: Secondary | ICD-10-CM | POA: Diagnosis not present

## 2018-04-06 DIAGNOSIS — M5416 Radiculopathy, lumbar region: Secondary | ICD-10-CM | POA: Diagnosis not present

## 2018-04-19 ENCOUNTER — Other Ambulatory Visit: Payer: Self-pay | Admitting: Neurological Surgery

## 2018-04-19 DIAGNOSIS — M4712 Other spondylosis with myelopathy, cervical region: Secondary | ICD-10-CM

## 2018-04-28 ENCOUNTER — Other Ambulatory Visit: Payer: Medicaid Other

## 2018-05-08 ENCOUNTER — Ambulatory Visit
Admission: RE | Admit: 2018-05-08 | Discharge: 2018-05-08 | Disposition: A | Discharge status: Home / Self Care | Payer: Medicaid Other | Source: Ambulatory Visit | Attending: Neurological Surgery | Admitting: Neurological Surgery

## 2018-05-08 DIAGNOSIS — M4712 Other spondylosis with myelopathy, cervical region: Secondary | ICD-10-CM

## 2018-05-08 DIAGNOSIS — M4802 Spinal stenosis, cervical region: Secondary | ICD-10-CM | POA: Diagnosis not present

## 2018-05-23 ENCOUNTER — Ambulatory Visit: Payer: Medicaid Other | Admitting: Family Medicine

## 2018-05-25 DIAGNOSIS — M542 Cervicalgia: Secondary | ICD-10-CM | POA: Diagnosis not present

## 2018-06-07 ENCOUNTER — Ambulatory Visit: Payer: Medicaid Other | Admitting: Family Medicine

## 2018-06-13 ENCOUNTER — Ambulatory Visit (INDEPENDENT_AMBULATORY_CARE_PROVIDER_SITE_OTHER): Payer: Medicaid Other | Admitting: Family Medicine

## 2018-06-13 ENCOUNTER — Encounter: Payer: Self-pay | Admitting: Family Medicine

## 2018-06-13 VITALS — BP 150/82 | HR 98 | Temp 98.2°F | Ht 62.0 in | Wt 124.0 lb

## 2018-06-13 DIAGNOSIS — L299 Pruritus, unspecified: Secondary | ICD-10-CM | POA: Diagnosis not present

## 2018-06-13 DIAGNOSIS — B070 Plantar wart: Secondary | ICD-10-CM

## 2018-06-13 DIAGNOSIS — R05 Cough: Secondary | ICD-10-CM

## 2018-06-13 DIAGNOSIS — Z09 Encounter for follow-up examination after completed treatment for conditions other than malignant neoplasm: Secondary | ICD-10-CM

## 2018-06-13 DIAGNOSIS — I10 Essential (primary) hypertension: Secondary | ICD-10-CM

## 2018-06-13 DIAGNOSIS — Z716 Tobacco abuse counseling: Secondary | ICD-10-CM

## 2018-06-13 DIAGNOSIS — R059 Cough, unspecified: Secondary | ICD-10-CM

## 2018-06-13 DIAGNOSIS — L409 Psoriasis, unspecified: Secondary | ICD-10-CM

## 2018-06-13 DIAGNOSIS — F17200 Nicotine dependence, unspecified, uncomplicated: Secondary | ICD-10-CM

## 2018-06-13 DIAGNOSIS — T7840XA Allergy, unspecified, initial encounter: Secondary | ICD-10-CM

## 2018-06-13 LAB — POCT URINALYSIS DIP (MANUAL ENTRY)
Bilirubin, UA: NEGATIVE
Blood, UA: NEGATIVE
Glucose, UA: NEGATIVE mg/dL
Ketones, POC UA: NEGATIVE mg/dL
Leukocytes, UA: NEGATIVE
Nitrite, UA: NEGATIVE
Protein Ur, POC: NEGATIVE mg/dL
Spec Grav, UA: 1.015 (ref 1.010–1.025)
Urobilinogen, UA: 0.2 U/dL
pH, UA: 6.5 (ref 5.0–8.0)

## 2018-06-13 MED ORDER — SALINE SPRAY 0.65 % NA SOLN: 1.0000 | NASAL | 6 refills | Status: DC | PRN

## 2018-06-13 MED ORDER — FLUTICASONE PROPIONATE 50 MCG/ACT NA SUSP: 2.0000 | Freq: Every day | NASAL | 6 refills | Status: DC

## 2018-06-13 MED ORDER — LORATADINE 10 MG PO TABS: 10.0000 mg | ORAL_TABLET | Freq: Every day | ORAL | 11 refills | Status: DC

## 2018-06-13 NOTE — Progress Notes (Signed)
Follow Up  Subjective:    Patient ID: Lynn Black, female    DOB: 10/03/1960, 58 y.o.   MRN: [id number]  Chief Complaint  Patient presents with   Follow-up    Psoraisis    HPI  Lynn Black is 58 year old female with a past medical history of Heart Murmur and Eczema. She is here today for follow.   Current Status: Since her last office visit, she is doing well with no complaints.   She continues to follow up with her Dermatologist regularly. She has Psoriasis over her entire body, which causes her intense itching. She uses Hydroxyzine for relief. She is currently smoking 6 cigarettes daily. Today she also has c/o plantar warts, which are impairing her from walking.   She denies visual changes, chest pain, cough, shortness of breath, heart palpitations, and falls. She has occasionally headaches and dizziness with position changes. Denies severe headaches, confusion, seizures, double vision, and blurred vision, nausea and vomiting.  She denies fevers, chills, fatigue, recent infections, weight loss, and night sweats. She has not had any headaches, visual changes, dizziness, and falls. No chest pain, heart palpitations, cough and shortness of breath reported. No reports of GI problems such as nausea, vomiting, diarrhea, and constipation. She has no reports of blood in stools, dysuria and hematuria. No depression or anxiety reported. She denies pain today.   Review of Systems  Constitutional: Negative.   HENT: Negative.   Eyes: Negative.   Respiratory: Positive for cough.   Cardiovascular: Negative.   Gastrointestinal: Negative.   Endocrine: Negative.   Genitourinary: Negative.   Musculoskeletal: Negative.   Skin: Negative.   Allergic/Immunologic: Negative.   Hematological: Negative.   Psychiatric/Behavioral: Negative.    Objective:   Physical Exam Vitals signs and nursing note reviewed.  Constitutional:      Appearance: Normal appearance. She is normal weight.  HENT:     Head:  Normocephalic and atraumatic.     Right Ear: External ear normal.     Left Ear: External ear normal.     Nose: Nose normal.     Mouth/Throat:     Mouth: Mucous membranes are moist.     Pharynx: Oropharynx is clear.  Eyes:     Conjunctiva/sclera: Conjunctivae normal.  Neck:     Musculoskeletal: Normal range of motion and neck supple.  Cardiovascular:     Rate and Rhythm: Normal rate and regular rhythm.     Pulses: Normal pulses.     Heart sounds: Normal heart sounds.  Pulmonary:     Effort: Pulmonary effort is normal.     Breath sounds: Normal breath sounds.  Abdominal:     General: Abdomen is flat. Bowel sounds are normal.     Palpations: Abdomen is soft.  Musculoskeletal: Normal range of motion.  Skin:    General: Skin is warm and dry.     Capillary Refill: Capillary refill takes less than 2 seconds.  Neurological:     General: No focal deficit present.     Mental Status: She is alert and oriented to person, place, and time.  Psychiatric:        Mood and Affect: Mood normal.        Behavior: Behavior normal.        Thought Content: Thought content normal.        Judgment: Judgment normal.    Assessment & Plan:   1. Hypertension, unspecified type Blood pressures are elevated today. She states that she smoked a  cigarette and drank a cup of coffee prior to her appointment this morning. We will re-check her blood pressure in 1 week. She will continue to decrease high sodium intake, excessive alcohol intake, increase potassium intake, smoking cessation, and increase physical activity of at least 30 minutes of cardio activity daily. She will continue to follow Heart Healthy or DASH diet. - POCT urinalysis dipstick  2. Psoriasis We will refer her to Dermatology where she has received injection for Psoriasis. - Ambulatory referral to Dermatology  3. Pruritus - Ambulatory referral to Dermatology  4. Plantar warts - Ambulatory referral to Podiatry  5. Allergic state, initial  encounter - fluticasone (FLONASE) 50 MCG/ACT nasal spray; Place 2 sprays into both nostrils daily.  Dispense: 16 g; Refill: 6 - loratadine (CLARITIN) 10 MG tablet; Take 1 tablet (10 mg total) by mouth daily.  Dispense: 30 tablet; Refill: 11 - sodium chloride (OCEAN) 0.65 % SOLN nasal spray; Place 1 spray into both nostrils as needed for congestion.  Dispense: 1 Bottle; Refill: 6  6. Cough Stable. She will continue OTC cough suppressant for relief.   7. Smoker  She continues to smoke less daily.   8. Encounter for smoking cessation counseling She is educated on the benefits of quitting smoking. She will contact office when she is ready to quit smoking.   9. Follow up She will follow up in 3 months. We will redraw labs at next office visit.   Meds ordered this encounter  Medications   fluticasone (FLONASE) 50 MCG/ACT nasal spray    Sig: Place 2 sprays into both nostrils daily.    Dispense:  16 g    Refill:  6   loratadine (CLARITIN) 10 MG tablet    Sig: Take 1 tablet (10 mg total) by mouth daily.    Dispense:  30 tablet    Refill:  11   sodium chloride (OCEAN) 0.65 % SOLN nasal spray    Sig: Place 1 spray into both nostrils as needed for congestion.    Dispense:  1 Bottle    Refill:  6    Referral Orders     Ambulatory referral to Dermatology     Ambulatory referral to Podiatry   Raliegh Ip,  MSN, FNP-C Patient Care [facility] [address] [place], [place] [address] [phone]

## 2018-06-13 NOTE — Patient Instructions (Addendum)
Fluticasone nasal spray What is this medicine? FLUTICASONE (floo TIK a sone) is a corticosteroid. This medicine is used to treat the symptoms of allergies like sneezing, itchy red eyes, and itchy, runny, or stuffy nose. This medicine is also used to treat nasal polyps. This medicine may be used for other purposes; ask your health care provider or pharmacist if you have questions. COMMON BRAND NAME(S): ClariSpray, Flonase, Flonase Allergy Relief, Flonase Sensimist, Veramyst, XHANCE What should I tell my health care provider before I take this medicine? They need to know if you have any of these conditions: -eye disease, vision problems -infection, like tuberculosis, herpes, or fungal infection -recent surgery on nose or sinuses -taking a corticosteroid by mouth -an unusual or allergic reaction to fluticasone, steroids, other medicines, foods, dyes, or preservatives -pregnant or trying to get pregnant -breast-feeding How should I use this medicine? This medicine is for use in the nose. Follow the directions on your product or prescription label. This medicine works best if used at regular intervals. Do not use more often than directed. Make sure that you are using your nasal spray correctly. After 6 months of daily use for allergies, talk to your doctor or health care professional before using it for a longer time. Ask your doctor or health care professional if you have any questions. Talk to your pediatrician regarding the use of this medicine in children. Special care may be needed. Some products have been used for allergies in children as young as 2 years. After 2 months of daily use without a prescription in a child, talk to your pediatrician before using it for a longer time. Use of this medicine for nasal polyps is not approved in children. Overdosage: If you think you have taken too much of this medicine contact a poison control center or emergency room at once. NOTE: This medicine is only for  you. Do not share this medicine with others. What if I miss a dose? If you miss a dose, use it as soon as you remember. If it is almost time for your next dose, use only that dose and continue with your regular schedule. Do not use double or extra doses. What may interact with this medicine? -certain antibiotics like clarithromycin and telithromycin -certain medicines for fungal infections like ketoconazole, itraconazole, and voriconazole -conivaptan -nefazodone -some medicines for HIV -vaccines This list may not describe all possible interactions. Give your health care provider a list of all the medicines, herbs, non-prescription drugs, or dietary supplements you use. Also tell them if you smoke, drink alcohol, or use illegal drugs. Some items may interact with your medicine. What should I watch for while using this medicine? Visit your healthcare professional for regular checks on your progress. Tell your healthcare professional if your symptoms do not start to get better or if they get worse. This medicine may increase your risk of getting an infection. Tell your doctor or health care professional if you are around anyone with measles or chickenpox, or if you develop sores or blisters that do not heal properly. What side effects may I notice from receiving this medicine? Side effects that you should report to your doctor or health care professional as soon as possible: -allergic reactions like skin rash, itching or hives, swelling of the face, lips, or tongue -changes in vision -crusting or sores in the nose -nosebleed -signs and symptoms of infection like fever or chills; cough; sore throat -white patches or sores in the mouth or nose Side effects that  usually do not require medical attention (report to your doctor or health care professional if they continue or are bothersome): -burning or irritation inside the nose or throat -changes in taste or smell -cough -headache This list may  not describe all possible side effects. Call your doctor for medical advice about side effects. You may report side effects to FDA at 1-800-FDA-1088. Where should I keep my medicine? Keep out of the reach of children. Store at room temperature between 15 and 30 degrees C (59 and 86 degrees F). Avoid exposure to extreme heat, cold, or light. Throw away any unused medicine after the expiration date. NOTE: This sheet is a summary. It may not cover all possible information. If you have questions about this medicine, talk to your doctor, pharmacist, or health care provider.  2019 Elsevier/Gold Standard (2017-06-17 14:10:08) Loratadine capsules or tablets What is this medicine? LORATADINE (lor AT a deen) is an antihistamine. It helps to relieve sneezing, runny nose, and itchy, watery eyes. This medicine is used to treat the symptoms of allergies. It is also used to treat itchy skin rash and hives. This medicine may be used for other purposes; ask your health care provider or pharmacist if you have questions. COMMON BRAND NAME(S): Alavert, Allergy Relief, Claritin, Claritin Hives Relief, Claritin Liqui-Gel, Claritin-D 24 Hour, Clear-Atadine, QlearQuil All Day & All Night Allergy Relief, Tavist ND What should I tell my health care provider before I take this medicine? They need to know if you have any of these conditions: -asthma -kidney disease -liver disease -an unusual or allergic reaction to loratadine, other antihistamines, other medicines, foods, dyes, or preservatives -pregnant or trying to get pregnant -breast-feeding How should I use this medicine? Take this medicine by mouth with a glass of water. Follow the directions on the label. You may take this medicine with food or on an empty stomach. Take your medicine at regular intervals. Do not take your medicine more often than directed. Talk to your pediatrician regarding the use of this medicine in children. While this medicine may be used in  children as young as 6 years for selected conditions, precautions do apply. Overdosage: If you think you have taken too much of this medicine contact a poison control center or emergency room at once. NOTE: This medicine is only for you. Do not share this medicine with others. What if I miss a dose? If you miss a dose, take it as soon as you can. If it is almost time for your next dose, take only that dose. Do not take double or extra doses. What may interact with this medicine? -other medicines for colds or allergies This list may not describe all possible interactions. Give your health care provider a list of all the medicines, herbs, non-prescription drugs, or dietary supplements you use. Also tell them if you smoke, drink alcohol, or use illegal drugs. Some items may interact with your medicine. What should I watch for while using this medicine? Tell your doctor or healthcare professional if your symptoms do not start to get better or if they get worse. Your mouth may get dry. Chewing sugarless gum or sucking hard candy, and drinking plenty of water may help. Contact your doctor if the problem does not go away or is severe. You may get drowsy or dizzy. Do not drive, use machinery, or do anything that needs mental alertness until you know how this medicine affects you. Do not stand or sit up quickly, especially if you are an older  patient. This reduces the risk of dizzy or fainting spells. What side effects may I notice from receiving this medicine? Side effects that you should report to your doctor or health care professional as soon as possible: -allergic reactions like skin rash, itching or hives, swelling of the face, lips, or tongue -breathing problems -unusually restless or nervous Side effects that usually do not require medical attention (report to your doctor or health care professional if they continue or are bothersome): -drowsiness -dry or irritated mouth or throat -headache This  list may not describe all possible side effects. Call your doctor for medical advice about side effects. You may report side effects to FDA at 1-800-FDA-1088. Where should I keep my medicine? Keep out of the reach of children. Store at room temperature between 2 and 30 degrees C (36 and 86 degrees F). Protect from moisture. Throw away any unused medicine after the expiration date. NOTE: This sheet is a summary. It may not cover all possible information. If you have questions about this medicine, talk to your doctor, pharmacist, or health care provider.  2019 Elsevier/Gold Standard (2007-11-28 17:17:24)   Psoriasis  Psoriasis is a long-term (chronic) skin condition. It causes raised, red patches (plaques) on your skin that look silvery. The red patches may show up anywhere on your body. They can be any size or shape. Psoriasis can come and go. It can range from mild to very bad. It cannot be passed from one person to another (not contagious). There is no cure for this condition, but it can be helped with treatment. Follow these instructions at home: Skin Care  Apply moisturizers to your skin as needed. Only use those that your doctor has said are okay.  Apply cool compresses to the affected areas.  Do not scratch your skin. Lifestyle   Do not use tobacco products. This includes cigarettes, chewing tobacco, and e-cigarettes. If you need help quitting, ask your doctor.  Drink little or no alcohol.  Try to lower your stress. Meditation or yoga may help.  Get sun as told by your doctor. Do not get sunburned.  Think about joining a psoriasis support group. Medicines  Take or use over-the-counter and prescription medicines only as told by your doctor.  If you were prescribed an antibiotic, take or use it as told by your doctor. Do not stop taking the antibiotic even if your condition starts to get better. General instructions  Keep a journal. Use this to help track what triggers an  outbreak. Try to avoid any triggers.  See a counselor or social worker if you feel very sad, upset, or hopeless about your condition and these feelings affect your work or relationships.  Keep all follow-up visits as told by your doctor. This is important. Contact a doctor if:  Your pain gets worse.  You have more redness or warmth in the affected areas.  You have new pain or stiffness in your joints.  Your pain or stiffness in your joints gets worse.  Your nails start to break easily.  Your nails pull away from the nail bed easily.  You have a fever.  You feel very sad (depressed). This information is not intended to replace advice given to you by your health care provider. Make sure you discuss any questions you have with your health care provider. Document Released: 07/02/2004 Document Revised: 10/31/2015 Document Reviewed: 10/10/2014 Elsevier Interactive Patient Education  2019 [employer]

## 2018-06-13 NOTE — Progress Notes (Signed)
124 °

## 2018-06-20 ENCOUNTER — Ambulatory Visit: Payer: Medicaid Other | Admitting: Family Medicine

## 2018-06-20 VITALS — BP 146/92 | HR 84 | Wt 124.0 lb

## 2018-06-20 DIAGNOSIS — Z0130 Encounter for examination of blood pressure without abnormal findings: Secondary | ICD-10-CM

## 2018-06-20 NOTE — Patient Instructions (Signed)
How to Increase Your Level of Physical Activity  Getting regular physical activity is important for your overall health and well-being. Most people do not get enough exercise. There are easy ways to increase your level of physical activity, even if you have not been very active in the past or you are just starting out. Why is physical activity important? Physical activity has many short-term and long-term health benefits. Regular exercise can:  Help you lose weight or maintain a healthy weight.  Strengthen your muscles and bones.  Boost your mood and improve self-esteem.  Reduce your risk of certain long-term (chronic) diseases, like heart disease, cancer, and diabetes.  Help you stay capable of walking and moving around (mobile) as you age.  Prevent accidents, such as falls, as you age.  Increase life expectancy. What are the benefits of being physically active on a regular basis? In addition to improving your physical health, being physically active on most days of the week can help you in ways that you may not expect. Benefits of regular physical activity may include:  Feeling good about your body.  Being able to move around more easily and for longer periods of time without getting tired (increased stamina).  Finding new sources of fun and enjoyment.  Meeting new people who share a common interest.  Being able to fight off illness better (enhanced immunity).  Being able to sleep better. What can happen if I am not physically active on a regular basis? Not getting enough physical activity can lead to an unhealthy lifestyle and future health problems. This can increase your chances of:  Becoming overweight or obese.  Becoming sick.  Developing chronic illnesses, like heart disease or diabetes.  Having mental health problems, like depression or anxiety.  Having sleep problems.  Having trouble walking or getting yourself around (reduced mobility).  Injuring yourself in  a fall as you get older. What steps can I take to be more physically active?  Check with your health care provider about how to get started. Ask your health care provider what activities are safe for you.  Start out slowly. Walking or doing some simple chair exercises is a good place to start, especially if you have not been active before or for a long time.  Try to find activities that you enjoy. You are more likely to commit to an exercise routine if it does not feel like a chore.  If you have bone or joint problems, choose low-impact exercises, like walking or swimming.  Include physical activity in your everyday routine.  Invite friends or family members to exercise with you. This also will help you commit to your workout plan.  Set goals that you can work toward.  Aim for at least 150 minutes of moderate-intensity exercise each week. Examples of moderate-intensity exercise include walking or riding a bike. Where to find more information  Centers for Disease Control and Prevention: BowlingGrip.is  [employer] on [place] www.http://villegas.org/  ChooseMyPlate: WirelessMortgages.dk Contact a health care provider if:  You have headaches, muscle aches, or joint pain.  You feel dizzy or light-headed while exercising.  You faint.  You have chest pain while exercising. Summary  Exercise benefits your mind and body at any age, even if you are just starting out.  If you have a chronic illness or have not been active for a while, check with your health care provider before increasing your physical activity.  Choose activities that are safe and enjoyable for you.Ask  your health care provider what activities are safe for you.  Start slowly. Tell your health care provider if you have problems as you start to increase your activity level. This information is not intended to replace advice given to you by  your health care provider. Make sure you discuss any questions you have with your health care provider. Document Released: 05/14/2016 Document Revised: 05/14/2016 Document Reviewed: 05/14/2016 Elsevier Interactive Patient Education  2019 Elsevier Inc. Heart-Healthy Eating Plan Heart-healthy meal planning includes:  Eating less unhealthy fats.  Eating more healthy fats.  Making other changes in your diet. Talk with your doctor or a diet specialist (dietitian) to create an eating plan that is right for you. What is my plan? Your doctor may recommend an eating plan that includes:  Total fat: ______% or less of total calories a day.  Saturated fat: ______% or less of total calories a day.  Cholesterol: less than _________mg a day. What are tips for following this plan? Cooking Avoid frying your food. Try to bake, boil, grill, or broil it instead. You can also reduce fat by:  Removing the skin from poultry.  Removing all visible fats from meats.  Steaming vegetables in water or broth. Meal planning   At meals, divide your plate into four equal parts: ? Fill one-half of your plate with vegetables and green salads. ? Fill one-fourth of your plate with whole grains. ? Fill one-fourth of your plate with lean protein foods.  Eat 4-5 servings of vegetables per day. A serving of vegetables is: ? 1 cup of raw or cooked vegetables. ? 2 cups of raw leafy greens.  Eat 4-5 servings of fruit per day. A serving of fruit is: ? 1 medium whole fruit. ?  cup of dried fruit. ?  cup of fresh, frozen, or canned fruit. ?  cup of 100% fruit juice.  Eat more foods that have soluble fiber. These are apples, broccoli, carrots, beans, peas, and barley. Try to get 20-30 g of fiber per day.  Eat 4-5 servings of nuts, legumes, and seeds per week: ? 1 serving of dried beans or legumes equals  cup after being cooked. ? 1 serving of nuts is  cup. ? 1 serving of seeds equals 1 tablespoon. General  information  Eat more home-cooked food. Eat less restaurant, buffet, and fast food.  Limit or avoid alcohol.  Limit foods that are high in starch and sugar.  Avoid fried foods.  Lose weight if you are overweight.  Keep track of how much salt (sodium) you eat. This is important if you have high blood pressure. Ask your doctor to tell you more about this.  Try to add vegetarian meals each week. Fats  Choose healthy fats. These include olive oil and canola oil, flaxseeds, walnuts, almonds, and seeds.  Eat more omega-3 fats. These include salmon, mackerel, sardines, tuna, flaxseed oil, and ground flaxseeds. Try to eat fish at least 2 times each week.  Check food labels. Avoid foods with trans fats or high amounts of saturated fat.  Limit saturated fats. ? These are often found in animal products, such as meats, butter, and cream. ? These are also found in plant foods, such as palm oil, palm kernel oil, and coconut oil.  Avoid foods with partially hydrogenated oils in them. These have trans fats. Examples are stick margarine, some tub margarines, cookies, crackers, and other baked goods. What foods can I eat? Fruits All fresh, canned (in natural juice), or frozen fruits. Vegetables  Fresh or frozen vegetables (raw, steamed, roasted, or grilled). Green salads. Grains Most grains. Choose whole wheat and whole grains most of the time. Rice and pasta, including brown rice and pastas made with whole wheat. Meats and other proteins Lean, well-trimmed beef, veal, pork, and lamb. Chicken and [place] without skin. All fish and shellfish. Wild duck, rabbit, pheasant, and venison. Egg whites or low-cholesterol egg substitutes. Dried beans, peas, lentils, and tofu. Seeds and most nuts. Dairy Low-fat or nonfat cheeses, including ricotta and mozzarella. Skim or 1% milk that is liquid, powdered, or evaporated. Buttermilk that is made with low-fat milk. Nonfat or low-fat yogurt. Fats and  oils Non-hydrogenated (trans-free) margarines. Vegetable oils, including soybean, sesame, sunflower, olive, peanut, safflower, corn, canola, and cottonseed. Salad dressings or mayonnaise made with a vegetable oil. Beverages Mineral water. Coffee and tea. Diet carbonated beverages. Sweets and desserts Sherbet, gelatin, and fruit ice. Small amounts of dark chocolate. Limit all sweets and desserts. Seasonings and condiments All seasonings and condiments. The items listed above may not be a complete list of foods and drinks you can eat. Contact a dietitian for more options. What foods should I avoid? Fruits Canned fruit in heavy syrup. Fruit in cream or butter sauce. Fried fruit. Limit coconut. Vegetables Vegetables cooked in cheese, cream, or butter sauce. Fried vegetables. Grains Breads that are made with saturated or trans fats, oils, or whole milk. Croissants. Sweet rolls. Donuts. High-fat crackers, such as cheese crackers. Meats and other proteins Fatty meats, such as hot dogs, ribs, sausage, bacon, rib-eye roast or steak. High-fat deli meats, such as salami and bologna. Caviar. Domestic duck and goose. Organ meats, such as liver. Dairy Cream, sour cream, cream cheese, and creamed cottage cheese. Whole-milk cheeses. Whole or 2% milk that is liquid, evaporated, or condensed. Whole buttermilk. Cream sauce or high-fat cheese sauce. Yogurt that is made from whole milk. Fats and oils Meat fat, or shortening. Cocoa butter, hydrogenated oils, palm oil, coconut oil, palm kernel oil. Solid fats and shortenings, including bacon fat, salt pork, lard, and butter. Nondairy cream substitutes. Salad dressings with cheese or sour cream. Beverages Regular sodas and juice drinks with added sugar. Sweets and desserts Frosting. Pudding. Cookies. Cakes. Pies. Milk chocolate or white chocolate. Buttered syrups. Full-fat ice cream or ice cream drinks. The items listed above may not be a complete list of foods  and drinks to avoid. Contact a dietitian for more information. Summary  Heart-healthy meal planning includes eating less unhealthy fats, eating more healthy fats, and making other changes in your diet.  Eat a balanced diet. This includes fruits and vegetables, low-fat or nonfat dairy, lean protein, nuts and legumes, whole grains, and heart-healthy oils and fats. This information is not intended to replace advice given to you by your health care provider. Make sure you discuss any questions you have with your health care provider. Document Released: 11/24/2011 Document Revised: 07/02/2017 Document Reviewed: 07/02/2017 Elsevier Interactive Patient Education  2019 Elsevier Inc. DASH Eating Plan DASH stands for "Dietary Approaches to Stop Hypertension." The DASH eating plan is a healthy eating plan that has been shown to reduce high blood pressure (hypertension). It may also reduce your risk for type 2 diabetes, heart disease, and stroke. The DASH eating plan may also help with weight loss. What are tips for following this plan?  General guidelines  Avoid eating more than 2,300 mg (milligrams) of salt (sodium) a day. If you have hypertension, you may need to reduce your sodium intake to 1,500  mg a day.  Limit alcohol intake to no more than 1 drink a day for nonpregnant women and 2 drinks a day for men. One drink equals 12 oz of beer, 5 oz of wine, or 1 oz of hard liquor.  Work with your health care provider to maintain a healthy body weight or to lose weight. Ask what an ideal weight is for you.  Get at least 30 minutes of exercise that causes your heart to beat faster (aerobic exercise) most days of the week. Activities may include walking, swimming, or biking.  Work with your health care provider or diet and nutrition specialist (dietitian) to adjust your eating plan to your individual calorie needs. Reading food labels   Check food labels for the amount of sodium per serving. Choose foods  with less than 5 percent of the Daily Value of sodium. Generally, foods with less than 300 mg of sodium per serving fit into this eating plan.  To find whole grains, look for the word "whole" as the first word in the ingredient list. Shopping  Buy products labeled as "low-sodium" or "no salt added."  Buy fresh foods. Avoid canned foods and premade or frozen meals. Cooking  Avoid adding salt when cooking. Use salt-free seasonings or herbs instead of table salt or sea salt. Check with your health care provider or pharmacist before using salt substitutes.  Do not fry foods. Cook foods using healthy methods such as baking, boiling, grilling, and broiling instead.  Cook with heart-healthy oils, such as olive, canola, soybean, or sunflower oil. Meal planning  Eat a balanced diet that includes: ? 5 or more servings of fruits and vegetables each day. At each meal, try to fill half of your plate with fruits and vegetables. ? Up to 6-8 servings of whole grains each day. ? Less than 6 oz of lean meat, poultry, or fish each day. A 3-oz serving of meat is about the same size as a deck of cards. One egg equals 1 oz. ? 2 servings of low-fat dairy each day. ? A serving of nuts, seeds, or beans 5 times each week. ? Heart-healthy fats. Healthy fats called Omega-3 fatty acids are found in foods such as flaxseeds and coldwater fish, like sardines, salmon, and mackerel.  Limit how much you eat of the following: ? Canned or prepackaged foods. ? Food that is high in trans fat, such as fried foods. ? Food that is high in saturated fat, such as fatty meat. ? Sweets, desserts, sugary drinks, and other foods with added sugar. ? Full-fat dairy products.  Do not salt foods before eating.  Try to eat at least 2 vegetarian meals each week.  Eat more home-cooked food and less restaurant, buffet, and fast food.  When eating at a restaurant, ask that your food be prepared with less salt or no salt, if  possible. What foods are recommended? The items listed may not be a complete list. Talk with your dietitian about what dietary choices are best for you. Grains Whole-grain or whole-wheat bread. Whole-grain or whole-wheat pasta. Brown rice. Orpah Cobb. Bulgur. Whole-grain and low-sodium cereals. Pita bread. Low-fat, low-sodium crackers. Whole-wheat flour tortillas. Vegetables Fresh or frozen vegetables (raw, steamed, roasted, or grilled). Low-sodium or reduced-sodium tomato and vegetable juice. Low-sodium or reduced-sodium tomato sauce and tomato paste. Low-sodium or reduced-sodium canned vegetables. Fruits All fresh, dried, or frozen fruit. Canned fruit in natural juice (without added sugar). Meat and other protein foods Skinless chicken or [place]. Ground chicken or  [place]. Pork with fat trimmed off. Fish and seafood. Egg whites. Dried beans, peas, or lentils. Unsalted nuts, nut butters, and seeds. Unsalted canned beans. Lean cuts of beef with fat trimmed off. Low-sodium, lean deli meat. Dairy Low-fat (1%) or fat-free (skim) milk. Fat-free, low-fat, or reduced-fat cheeses. Nonfat, low-sodium ricotta or cottage cheese. Low-fat or nonfat yogurt. Low-fat, low-sodium cheese. Fats and oils Soft margarine without trans fats. Vegetable oil. Low-fat, reduced-fat, or light mayonnaise and salad dressings (reduced-sodium). Canola, safflower, olive, soybean, and sunflower oils. Avocado. Seasoning and other foods Herbs. Spices. Seasoning mixes without salt. Unsalted popcorn and pretzels. Fat-free sweets. What foods are not recommended? The items listed may not be a complete list. Talk with your dietitian about what dietary choices are best for you. Grains Baked goods made with fat, such as croissants, muffins, or some breads. Dry pasta or rice meal packs. Vegetables Creamed or fried vegetables. Vegetables in a cheese sauce. Regular canned vegetables (not low-sodium or reduced-sodium). Regular canned  tomato sauce and paste (not low-sodium or reduced-sodium). Regular tomato and vegetable juice (not low-sodium or reduced-sodium). Rosita FirePickles. Olives. Fruits Canned fruit in a light or heavy syrup. Fried fruit. Fruit in cream or butter sauce. Meat and other protein foods Fatty cuts of meat. Ribs. Fried meat. Tomasa BlaseBacon. Sausage. Bologna and other processed lunch meats. Salami. Fatback. Hotdogs. Bratwurst. Salted nuts and seeds. Canned beans with added salt. Canned or smoked fish. Whole eggs or egg yolks. Chicken or [place] with skin. Dairy Whole or 2% milk, cream, and half-and-half. Whole or full-fat cream cheese. Whole-fat or sweetened yogurt. Full-fat cheese. Nondairy creamers. Whipped toppings. Processed cheese and cheese spreads. Fats and oils Butter. Stick margarine. Lard. Shortening. Ghee. Bacon fat. Tropical oils, such as coconut, palm kernel, or palm oil. Seasoning and other foods Salted popcorn and pretzels. Onion salt, garlic salt, seasoned salt, table salt, and sea salt. Worcestershire sauce. Tartar sauce. Barbecue sauce. Teriyaki sauce. Soy sauce, including reduced-sodium. Steak sauce. Canned and packaged gravies. Fish sauce. Oyster sauce. Cocktail sauce. Horseradish that you find on the shelf. Ketchup. Mustard. Meat flavorings and tenderizers. Bouillon cubes. Hot sauce and Tabasco sauce. Premade or packaged marinades. Premade or packaged taco seasonings. Relishes. Regular salad dressings. Where to find more information:  National Heart, Lung, and Blood Institute: PopSteam.iswww.nhlbi.nih.gov  American Heart Association: www.heart.org Summary  The DASH eating plan is a healthy eating plan that has been shown to reduce high blood pressure (hypertension). It may also reduce your risk for type 2 diabetes, heart disease, and stroke.  With the DASH eating plan, you should limit salt (sodium) intake to 2,300 mg a day. If you have hypertension, you may need to reduce your sodium intake to 1,500 mg a day.  When  on the DASH eating plan, aim to eat more fresh fruits and vegetables, whole grains, lean proteins, low-fat dairy, and heart-healthy fats.  Work with your health care provider or diet and nutrition specialist (dietitian) to adjust your eating plan to your individual calorie needs. This information is not intended to replace advice given to you by your health care provider. Make sure you discuss any questions you have with your health care provider. Document Released: 05/14/2011 Document Revised: 05/18/2016 Document Reviewed: 05/18/2016 Elsevier Interactive Patient Education  2019 [employer]

## 2018-06-22 DIAGNOSIS — D492 Neoplasm of unspecified behavior of bone, soft tissue, and skin: Secondary | ICD-10-CM | POA: Diagnosis not present

## 2018-07-06 DIAGNOSIS — D492 Neoplasm of unspecified behavior of bone, soft tissue, and skin: Secondary | ICD-10-CM | POA: Diagnosis not present

## 2018-07-12 ENCOUNTER — Telehealth: Payer: Self-pay

## 2018-07-12 NOTE — Telephone Encounter (Signed)
Patient will come by and pick up referral because the other office is not receiving it

## 2018-07-13 DIAGNOSIS — M7918 Myalgia, other site: Secondary | ICD-10-CM | POA: Diagnosis not present

## 2018-07-27 DIAGNOSIS — M47812 Spondylosis without myelopathy or radiculopathy, cervical region: Secondary | ICD-10-CM | POA: Diagnosis not present

## 2018-07-28 DIAGNOSIS — G5762 Lesion of plantar nerve, left lower limb: Secondary | ICD-10-CM | POA: Diagnosis not present

## 2018-07-28 DIAGNOSIS — D492 Neoplasm of unspecified behavior of bone, soft tissue, and skin: Secondary | ICD-10-CM | POA: Diagnosis not present

## 2018-08-04 DIAGNOSIS — G5762 Lesion of plantar nerve, left lower limb: Secondary | ICD-10-CM | POA: Diagnosis not present

## 2018-08-05 ENCOUNTER — Other Ambulatory Visit (INDEPENDENT_AMBULATORY_CARE_PROVIDER_SITE_OTHER): Payer: Self-pay | Admitting: Family Medicine

## 2018-08-12 ENCOUNTER — Other Ambulatory Visit (INDEPENDENT_AMBULATORY_CARE_PROVIDER_SITE_OTHER): Payer: Self-pay

## 2018-08-12 DIAGNOSIS — G5762 Lesion of plantar nerve, left lower limb: Secondary | ICD-10-CM | POA: Diagnosis not present

## 2018-08-24 ENCOUNTER — Ambulatory Visit: Payer: Medicaid Other | Admitting: Podiatry

## 2018-08-24 ENCOUNTER — Other Ambulatory Visit: Payer: Self-pay | Admitting: Podiatry

## 2018-08-24 ENCOUNTER — Encounter: Payer: Self-pay | Admitting: Podiatry

## 2018-08-24 ENCOUNTER — Other Ambulatory Visit: Payer: Self-pay

## 2018-08-24 ENCOUNTER — Ambulatory Visit (INDEPENDENT_AMBULATORY_CARE_PROVIDER_SITE_OTHER): Payer: Medicaid Other

## 2018-08-24 VITALS — BP 117/62

## 2018-08-24 DIAGNOSIS — G5762 Lesion of plantar nerve, left lower limb: Secondary | ICD-10-CM

## 2018-08-24 DIAGNOSIS — D492 Neoplasm of unspecified behavior of bone, soft tissue, and skin: Secondary | ICD-10-CM | POA: Diagnosis not present

## 2018-08-24 DIAGNOSIS — M79672 Pain in left foot: Secondary | ICD-10-CM

## 2018-08-26 DIAGNOSIS — I712 Thoracic aortic aneurysm, without rupture, unspecified: Secondary | ICD-10-CM | POA: Insufficient documentation

## 2018-08-29 NOTE — Progress Notes (Signed)
° °  HPI: 58 year old female presenting today as a new patient, referred by Dr. Elijah Birk, with a chief complaint of burning, stinging pain to the ball of the left foot that began 2-3 months ago. She has received injections for treatment which have not helped to alleviate the symptoms. There are no worsening factors noted. Patient is here for further evaluation and treatment.   Past Medical History:  Diagnosis Date   Eczema    Heart murmur    " nothing to wory about"     Physical Exam: General: The patient is alert and oriented x3 in no acute distress.  Dermatology: Skin is warm, dry and supple bilateral lower extremities. Negative for open lesions or macerations.  Vascular: Palpable pedal pulses bilaterally. No edema or erythema noted. Capillary refill within normal limits.  Neurological: Epicritic and protective threshold grossly intact bilaterally.   Musculoskeletal Exam: Sharp pain with palpation of the 2nd interspace and lateral compression of the metatarsal heads consistent with neuroma.  Positive Lendell Caprice sign with loadbearing of the forefoot.  Radiographic Exam:  Normal osseous mineralization. Joint spaces preserved. No fracture/dislocation/boney destruction.    Assessment: 1. 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 Metatarsalgia left    Plan of Care:  1. Patient was evaluated. X-Rays reviewed.  2. Patient received injections which have not helped.  3. Recommended custom orthotics. Patient will pursue this at Dr. Tasia Catchings office.  4. Return to clinic as needed.    Felecia Shelling, DPM [facility]  Dr. Felecia Shelling, DPM    [address]                                        [place], [place] [address]                Office [phone]  Fax [phone]

## 2018-08-30 LAB — VAHRT HISTORIC LVEF
Ejection Fraction: 60 %
Ejection Fraction: 60 %

## 2018-08-31 ENCOUNTER — Ambulatory Visit (INDEPENDENT_AMBULATORY_CARE_PROVIDER_SITE_OTHER): Payer: Self-pay | Admitting: Cardiology

## 2018-08-31 DIAGNOSIS — E663 Overweight: Secondary | ICD-10-CM | POA: Insufficient documentation

## 2018-08-31 DIAGNOSIS — E7800 Pure hypercholesterolemia, unspecified: Secondary | ICD-10-CM | POA: Insufficient documentation

## 2018-08-31 DIAGNOSIS — I2510 Atherosclerotic heart disease of native coronary artery without angina pectoris: Secondary | ICD-10-CM | POA: Insufficient documentation

## 2018-09-12 ENCOUNTER — Ambulatory Visit (INDEPENDENT_AMBULATORY_CARE_PROVIDER_SITE_OTHER): Payer: Medicaid Other | Admitting: Family Medicine

## 2018-09-12 ENCOUNTER — Encounter: Payer: Self-pay | Admitting: Family Medicine

## 2018-09-12 ENCOUNTER — Other Ambulatory Visit: Payer: Self-pay

## 2018-09-12 DIAGNOSIS — I10 Essential (primary) hypertension: Secondary | ICD-10-CM | POA: Diagnosis not present

## 2018-09-12 DIAGNOSIS — R05 Cough: Secondary | ICD-10-CM | POA: Diagnosis not present

## 2018-09-12 DIAGNOSIS — B070 Plantar wart: Secondary | ICD-10-CM | POA: Diagnosis not present

## 2018-09-12 DIAGNOSIS — R059 Cough, unspecified: Secondary | ICD-10-CM

## 2018-09-12 DIAGNOSIS — Z09 Encounter for follow-up examination after completed treatment for conditions other than malignant neoplasm: Secondary | ICD-10-CM | POA: Diagnosis not present

## 2018-09-12 DIAGNOSIS — L409 Psoriasis, unspecified: Secondary | ICD-10-CM

## 2018-09-12 NOTE — Progress Notes (Signed)
Virtual Visit via Telephone Note  I connected with Lynn Black on 09/12/18 at  9:00 AM EDT by telephone and verified that I am speaking with the correct person using two identifiers.   I discussed the limitations, risks, security and privacy concerns of performing an evaluation and management service by telephone and the availability of in person appointments. I also discussed with the patient that there may be a patient responsible charge related to this service. The patient expressed understanding and agreed to proceed.   History of Present Illness:  Past Medical History:  Diagnosis Date  . Eczema   . Heart murmur    " nothing to wory about"  . Pruritus   . Psoriasis   . Seasonal allergies   . Smoking    Current Outpatient Medications on File Prior to Visit  Medication Sig Dispense Refill  . betamethasone valerate ointment (VALISONE) 0.1 % Apply 1 application topically daily.  1  . cyclobenzaprine (FLEXERIL) 10 MG tablet Take 10 mg by mouth 3 (three) times daily as needed for muscle spasms.    . diphenhydrAMINE (BENADRYL) 25 MG tablet Take 25 mg by mouth 2 (two) times daily as needed.    . gabapentin (NEURONTIN) 100 MG capsule Take 1 capsule (100 mg total) by mouth 3 (three) times daily. 90 capsule 0  . hydrOXYzine (ATARAX/VISTARIL) 25 MG tablet Take 1 tablet (25 mg total) by mouth every 6 (six) hours as needed for itching (Rash). 60 tablet 1  . ibuprofen (ADVIL,MOTRIN) 800 MG tablet Take 800 mg by mouth every 8 (eight) hours as needed. for pain  0  . loratadine (CLARITIN) 10 MG tablet Take 1 tablet (10 mg total) by mouth daily. 30 tablet 11  . mometasone (ELOCON) 0.1 % ointment Apply topically daily. 45 g 2  . sodium chloride (OCEAN) 0.65 % SOLN nasal spray Place 1 spray into both nostrils as needed for congestion. 1 Bottle 6  . triamcinolone cream (KENALOG) 0.1 % APPLY 1 APPLICATION TOPICALLY 2 TIMES DAILY. 454 g 2  . fluticasone (FLONASE) 50 MCG/ACT nasal spray Place 2 sprays  into both nostrils daily. (Patient not taking: Reported on 08/24/2018) 16 g 6   No current facility-administered medications on file prior to visit.     Current Status: Since her last office visit, she is doing well with no complaints. Her anxiety is mild today. She denies suicidal ideations, homicidal ideations, or auditory hallucinations. Her Psoriasis is flared up today. She states that she has not heard from referral as of yet. She is taking Hydroxyzine as prescribed for itching. She continues to follow up with Dermatology as needed. She is currently smoking 1/2 pack cigarettes daily. She has a mild occasional cough. Her plantar warts have resolved. She has been following up with Podiatry and was recently diagnosed with a pinch nerve, which she is going to schedule for future surgery. Presently, she is receiving evaluation of shoe inserts. Her foot pain is moderate today. She is walking moderately. She has upcoming appointment with Neurology for neck pain.   She denies fevers, chills, fatigue, recent infections, weight loss, and night sweats. She has not had any headaches, visual changes, dizziness, and falls. No chest pain, heart palpitations, and shortness of breath reported. No reports of GI problems such as nausea, vomiting, diarrhea, and constipation. She has no reports of blood in stools, dysuria and hematuria. She denies pain today.   Observations/Objective:  Telephone Virtual Visit   Assessment and Plan:  1. Hypertension, unspecified  type She will continue to decrease high sodium intake, excessive alcohol intake, increase potassium intake, smoking cessation, and increase physical activity of at least 30 minutes of cardio activity daily. She will continue to follow Heart Healthy or DASH diet.  2. Psoriasis Continue to follow up with Dermatologist as needed.   3. Plantar warts Resolved.   4. Cough Stable, probable r/t to smoking.   Follow Up Instructions:  She will follow up  in 6 months.    I discussed the assessment and treatment plan with the patient. The patient was provided an opportunity to ask questions and all were answered. The patient agreed with the plan and demonstrated an understanding of the instructions.   The patient was advised to call back or seek an in-person evaluation if the symptoms worsen or if the condition fails to improve as anticipated.  I provided 15-20 minutes of non-face-to-face time during this encounter.   Kallie Locks, FNP

## 2018-09-13 ENCOUNTER — Other Ambulatory Visit: Payer: Self-pay

## 2018-09-21 ENCOUNTER — Telehealth: Payer: Self-pay

## 2018-09-21 DIAGNOSIS — L2089 Other atopic dermatitis: Secondary | ICD-10-CM | POA: Diagnosis not present

## 2018-09-22 NOTE — Telephone Encounter (Signed)
Left a vm for patient to callback

## 2018-09-23 NOTE — Telephone Encounter (Signed)
Left a vm for patient to callback

## 2018-09-29 DIAGNOSIS — M47812 Spondylosis without myelopathy or radiculopathy, cervical region: Secondary | ICD-10-CM | POA: Diagnosis not present

## 2018-09-29 DIAGNOSIS — Q761 Klippel-Feil syndrome: Secondary | ICD-10-CM | POA: Diagnosis not present

## 2018-11-23 LAB — LIPID PANEL, WITHOUT TOTAL CHOLESTEROL/HDL RATIO, SERUM
Cholesterol: 262 mg/dL — ABNORMAL HIGH (ref 100–199)
HDL: 89 mg/dL (ref >39)
LDL Calculated: 139 mg/dL — ABNORMAL HIGH (ref 0–99)
Triglycerides: 172 mg/dL — ABNORMAL HIGH (ref 0–149)
VLDL Calculated: 34 mg/dL (ref 5–40)

## 2018-11-23 LAB — URINALYSIS WITH MICROSCOPIC
Bilirubin, UA: NEGATIVE
Blood, UA: NEGATIVE
Glucose, UA: NEGATIVE
Ketones UA: NEGATIVE
Nitrite, UA: NEGATIVE
Protein, UA: NEGATIVE
Urine Specific Gravity: 1.02 (ref 1.005–1.030)
Urobilinogen, Ur: 0.2 mg/dL (ref 0.2–1.0)
pH, UA: 6 (ref 5.0–7.5)

## 2018-11-23 LAB — CBC AND DIFFERENTIAL
Baso(Absolute): 0 10*3/uL (ref 0.0–0.2)
Basos: 1 %
Eos: 2 %
Eosinophils Absolute: 0.1 10*3/uL (ref 0.0–0.4)
Hematocrit: 43.9 % (ref 34.0–46.6)
Hemoglobin: 14.6 g/dL (ref 11.1–15.9)
Immature Granulocytes Absolute: 0 10*3/uL (ref 0.0–0.1)
Immature Granulocytes: 0 %
Lymphocytes Absolute: 1.5 10*3/uL (ref 0.7–3.1)
Lymphocytes: 32 %
MCH: 30.2 pg (ref 26.6–33.0)
MCHC: 33.3 g/dL (ref 31.5–35.7)
MCV: 91 fL (ref 79–97)
Monocytes Absolute: 0.4 10*3/uL (ref 0.1–0.9)
Monocytes: 7 %
Neutrophils Absolute: 2.8 10*3/uL (ref 1.4–7.0)
Neutrophils: 58 %
Platelets: 188 10*3/uL (ref 150–379)
RBC: 4.84 x10E6/uL (ref 3.77–5.28)
RDW: 14.1 % (ref 12.3–15.4)
WBC: 4.8 10*3/uL (ref 3.4–10.8)

## 2018-11-23 LAB — REFLEX - MICROSCOPIC EXAMINATION
Casts, UA: NONE SEEN /lpf
Epithelial Cells (non renal): >10 /hpf — AB (ref 0–10)

## 2018-11-23 LAB — COMPREHENSIVE METABOLIC PANEL
ALT: 37 IU/L — ABNORMAL HIGH (ref 0–32)
AST (SGOT): 25 IU/L (ref 0–40)
Albumin/Globulin Ratio: 1.5 (ref 1.2–2.2)
Albumin: 4.6 g/dL (ref 3.5–5.5)
Alkaline Phosphatase: 99 IU/L (ref 39–117)
BUN / Creatinine Ratio: 24 — ABNORMAL HIGH (ref 9–23)
BUN: 18 mg/dL (ref 6–24)
Bilirubin, Total: 0.5 mg/dL (ref 0.0–1.2)
CO2: 22 mmol/L (ref 20–29)
Calcium: 9.6 mg/dL (ref 8.7–10.2)
Chloride: 103 mmol/L (ref 96–106)
Creatinine: 0.76 mg/dL (ref 0.57–1.00)
EGFR: 102 mL/min/{1.73_m2} (ref >59)
EGFR: 89 mL/min/{1.73_m2} (ref >59)
Globulin, Total: 3 g/dL (ref 1.5–4.5)
Glucose: 90 mg/dL (ref 65–99)
Potassium: 4.7 mmol/L (ref 3.5–5.2)
Protein, Total: 7.6 g/dL (ref 6.0–8.5)
Sodium: 140 mmol/L (ref 134–144)

## 2018-11-23 LAB — TSH: TSH: 3.37 u[IU]/mL (ref 0.450–4.500)

## 2018-12-02 DIAGNOSIS — M542 Cervicalgia: Secondary | ICD-10-CM | POA: Diagnosis not present

## 2018-12-23 ENCOUNTER — Other Ambulatory Visit (INDEPENDENT_AMBULATORY_CARE_PROVIDER_SITE_OTHER): Payer: Self-pay | Admitting: Family Medicine

## 2019-01-06 ENCOUNTER — Encounter (INDEPENDENT_AMBULATORY_CARE_PROVIDER_SITE_OTHER): Payer: Self-pay

## 2019-01-11 LAB — COMPREHENSIVE METABOLIC PANEL
ALT: 31 IU/L (ref 0–32)
AST (SGOT): 26 IU/L (ref 0–40)
Albumin/Globulin Ratio: 1.9 (ref 1.2–2.2)
Albumin: 5 g/dL — ABNORMAL HIGH (ref 3.8–4.9)
Alkaline Phosphatase: 81 IU/L (ref 39–117)
BUN / Creatinine Ratio: 7 — ABNORMAL LOW (ref 9–23)
BUN: 7 mg/dL (ref 6–24)
Bilirubin, Total: 0.3 mg/dL (ref 0.0–1.2)
CO2: 21 mmol/L (ref 20–29)
Calcium: 9.7 mg/dL (ref 8.7–10.2)
Chloride: 104 mmol/L (ref 96–106)
Creatinine: 0.97 mg/dL (ref 0.57–1.00)
EGFR: 65 mL/min/{1.73_m2} (ref >59)
EGFR: 75 mL/min/{1.73_m2} (ref >59)
Globulin, Total: 2.6 g/dL (ref 1.5–4.5)
Glucose: 82 mg/dL (ref 65–99)
Potassium: 5.1 mmol/L (ref 3.5–5.2)
Protein, Total: 7.6 g/dL (ref 6.0–8.5)
Sodium: 143 mmol/L (ref 134–144)

## 2019-01-11 LAB — CBC AND DIFFERENTIAL
Baso(Absolute): 0 10*3/uL (ref 0.0–0.2)
Basos: 1 %
Eos: 2 %
Eosinophils Absolute: 0.1 10*3/uL (ref 0.0–0.4)
Hematocrit: 44.4 % (ref 34.0–46.6)
Hemoglobin: 15 g/dL (ref 11.1–15.9)
Immature Granulocytes Absolute: 0 10*3/uL (ref 0.0–0.1)
Immature Granulocytes: 0 %
Lymphocytes Absolute: 1.3 10*3/uL (ref 0.7–3.1)
Lymphocytes: 25 %
MCH: 29.9 pg (ref 26.6–33.0)
MCHC: 33.8 g/dL (ref 31.5–35.7)
MCV: 88 fL (ref 79–97)
Monocytes Absolute: 0.3 10*3/uL (ref 0.1–0.9)
Monocytes: 6 %
Neutrophils Absolute: 3.5 10*3/uL (ref 1.4–7.0)
Neutrophils: 66 %
Platelets: 212 10*3/uL (ref 150–450)
RBC: 5.02 x10E6/uL (ref 3.77–5.28)
RDW: 13 % (ref 11.7–15.4)
WBC: 5.2 10*3/uL (ref 3.4–10.8)

## 2019-01-11 LAB — LIPID PANEL, WITHOUT TOTAL CHOLESTEROL/HDL RATIO, SERUM
Cholesterol: 258 mg/dL — ABNORMAL HIGH (ref 100–199)
HDL: 85 mg/dL (ref >39)
LDL Calculated: 155 mg/dL — ABNORMAL HIGH (ref 0–99)
Triglycerides: 92 mg/dL (ref 0–149)
VLDL Calculated: 18 mg/dL (ref 5–40)

## 2019-01-11 LAB — THYROID STIMULATING HORMONE (TSH), REFLEX ON ABNORMAL TO FREE T4, SERUM: TSH: 1.49 u[IU]/mL (ref 0.450–4.500)

## 2019-01-24 ENCOUNTER — Other Ambulatory Visit (INDEPENDENT_AMBULATORY_CARE_PROVIDER_SITE_OTHER): Payer: Self-pay | Admitting: Family Medicine

## 2019-03-14 ENCOUNTER — Ambulatory Visit: Payer: Medicaid Other | Admitting: Family Medicine

## 2019-05-09 DIAGNOSIS — E538 Deficiency of other specified B group vitamins: Secondary | ICD-10-CM

## 2019-05-09 DIAGNOSIS — E559 Vitamin D deficiency, unspecified: Secondary | ICD-10-CM

## 2019-05-09 HISTORY — DX: Vitamin D deficiency, unspecified: E55.9

## 2019-05-09 HISTORY — DX: Deficiency of other specified B group vitamins: E53.8

## 2019-05-30 ENCOUNTER — Encounter: Payer: Self-pay | Admitting: Family Medicine

## 2019-05-30 ENCOUNTER — Ambulatory Visit (INDEPENDENT_AMBULATORY_CARE_PROVIDER_SITE_OTHER): Payer: Medicaid Other | Admitting: Family Medicine

## 2019-05-30 ENCOUNTER — Other Ambulatory Visit: Payer: Self-pay

## 2019-05-30 VITALS — BP 156/83 | HR 100 | Temp 98.3°F | Ht 62.0 in | Wt 118.6 lb

## 2019-05-30 DIAGNOSIS — L409 Psoriasis, unspecified: Secondary | ICD-10-CM

## 2019-05-30 DIAGNOSIS — Z0000 Encounter for general adult medical examination without abnormal findings: Secondary | ICD-10-CM | POA: Diagnosis not present

## 2019-05-30 DIAGNOSIS — M792 Neuralgia and neuritis, unspecified: Secondary | ICD-10-CM | POA: Diagnosis not present

## 2019-05-30 DIAGNOSIS — Z5320 Procedure and treatment not carried out because of patient's decision for unspecified reasons: Secondary | ICD-10-CM

## 2019-05-30 DIAGNOSIS — Z09 Encounter for follow-up examination after completed treatment for conditions other than malignant neoplasm: Secondary | ICD-10-CM | POA: Diagnosis not present

## 2019-05-30 DIAGNOSIS — I10 Essential (primary) hypertension: Secondary | ICD-10-CM

## 2019-05-30 DIAGNOSIS — R8290 Unspecified abnormal findings in urine: Secondary | ICD-10-CM

## 2019-05-30 LAB — POCT URINALYSIS DIPSTICK
Bilirubin, UA: NEGATIVE
Blood, UA: NEGATIVE
Glucose, UA: NEGATIVE
Ketones, UA: NEGATIVE
Nitrite, UA: NEGATIVE
Protein, UA: NEGATIVE
Spec Grav, UA: 1.02 (ref 1.010–1.025)
Urobilinogen, UA: 0.2 E.U./dL
pH, UA: 5.5 (ref 5.0–8.0)

## 2019-05-30 LAB — POCT GLYCOSYLATED HEMOGLOBIN (HGB A1C): Hemoglobin A1C: 5.2 % (ref 4.0–5.6)

## 2019-05-30 LAB — GLUCOSE, POCT (MANUAL RESULT ENTRY): POC Glucose: 133 mg/dl — AB (ref 70–99)

## 2019-05-30 MED ORDER — CYCLOBENZAPRINE HCL 10 MG PO TABS
10.0000 mg | ORAL_TABLET | Freq: Three times a day (TID) | ORAL | 3 refills | Status: DC | PRN
Start: 2019-05-30 — End: 2020-08-21

## 2019-05-30 MED ORDER — VANICREAM EX CREA: TOPICAL_CREAM | Freq: Every day | CUTANEOUS | 0 refills | Status: DC | PRN

## 2019-05-30 NOTE — Progress Notes (Signed)
[facility]   Established Patient Office Visit  Subjective:  Patient ID: Lynn Black, female    DOB: 12-22-1960  Age: 58 y.o. MRN: [id number]  CC:  Chief Complaint  Patient presents with   Follow-up    HTN    HPI Lynn Black is a 58 female who presents for Follow Up today.   Past Medical History:  Diagnosis Date   Eczema    Heart murmur    " nothing to wory about"   Pruritus    Psoriasis    Seasonal allergies    Smoking    Current Status: Since her last office visit, she has c/o recurring Psoriasis lately. She reports that all prescribed medications are not effective and is not taking at this time. She is inquiring about past prescribe cream that was effective in symptom relief of Psoriasis. She continues to smoke 4 cigarettes a day. Her anxiety is mild today. She denies suicidal ideations, homicidal ideations, or auditory hallucinations. She denies fevers, chills, fatigue, recent infections, weight loss, and night sweats. She has not had any headaches, visual changes, dizziness, and falls. No chest pain, heart palpitations, cough and shortness of breath reported. No reports of GI problems such as nausea, vomiting, diarrhea, and constipation. She has no reports of blood in stools, dysuria and hematuria. No depression or anxiety, and  She denies pain today.   Past Surgical History:  Procedure Laterality Date   ANTERIOR CERVICAL DECOMP/DISCECTOMY FUSION N/A 10/26/2016   Procedure: Cervical four-five, Cervical five-six  Anterior cervical decompression/discectomy/fusion;  Surgeon: Kristeen Miss, MD;  Location: [place];  Service: Neurosurgery;  Laterality: N/A;   CERVICAL DISCECTOMY     ROTATOR CUFF REPAIR Right     Family History  Family history unknown: Yes    Social History   Socioeconomic History   Marital status: Divorced    Spouse name: Not on file   Number of children: Not on file   Years of education: Not  on file   Highest education level: Not on file  Occupational History   Not on file  Tobacco Use   Smoking status: Current Every Day Smoker    Packs/day: 0.50    Years: 30.00    Pack years: 15.00    Types: Cigarettes   Smokeless tobacco: Never Used  Substance and Sexual Activity   Alcohol use: Yes    Alcohol/week: 2.0 standard drinks    Types: 2 Glasses of wine per week   Drug use: No   Sexual activity: Not Currently  Other Topics Concern   Not on file  Social History Narrative   Not on file   Social Determinants of Health   Financial Resource Strain:    Difficulty of Paying Living Expenses: Not on file  Food Insecurity:    Worried About [name] in the Last Year: Not on file   Ran Out of Food in the Last Year: Not on file  Transportation Needs:    Lack of Transportation (Medical): Not on file   Lack of Transportation (Non-Medical): Not on file  Physical Activity:    Days of Exercise per Week: Not on file   Minutes of Exercise per Session: Not on file  Stress:    Feeling of Stress : Not on file  Social Connections:    Frequency of Communication with Friends and Family: Not on file   Frequency of Social Gatherings with Friends and Family: Not on file  Attends Religious Services: Not on file   Active Member of Clubs or Organizations: Not on file   Attends Archivist Meetings: Not on file   Marital Status: Not on file  Intimate Partner Violence:    Fear of Current or Ex-Partner: Not on file   Emotionally Abused: Not on file   Physically Abused: Not on file   Sexually Abused: Not on file    Outpatient Medications Prior to Visit  Medication Sig Dispense Refill   betamethasone valerate ointment (VALISONE) 0.1 % Apply 1 application topically daily.  1   diphenhydrAMINE (BENADRYL) 25 MG tablet Take 25 mg by mouth 2 (two) times daily as needed.     fluticasone (FLONASE) 50 MCG/ACT nasal spray Place 2 sprays into both  nostrils daily. (Patient not taking: Reported on 08/24/2018) 16 g 6   gabapentin (NEURONTIN) 100 MG capsule Take 1 capsule (100 mg total) by mouth 3 (three) times daily. (Patient not taking: Reported on 05/30/2019) 90 capsule 0   hydrOXYzine (ATARAX/VISTARIL) 25 MG tablet Take 1 tablet (25 mg total) by mouth every 6 (six) hours as needed for itching (Rash). (Patient not taking: Reported on 05/30/2019) 60 tablet 1   ibuprofen (ADVIL,MOTRIN) 800 MG tablet Take 800 mg by mouth every 8 (eight) hours as needed. for pain  0   loratadine (CLARITIN) 10 MG tablet Take 1 tablet (10 mg total) by mouth daily. (Patient not taking: Reported on 05/30/2019) 30 tablet 11   mometasone (ELOCON) 0.1 % ointment Apply topically daily. (Patient not taking: Reported on 05/30/2019) 45 g 2   sodium chloride (OCEAN) 0.65 % SOLN nasal spray Place 1 spray into both nostrils as needed for congestion. (Patient not taking: Reported on 05/30/2019) 1 Bottle 6   triamcinolone cream (KENALOG) 0.1 % APPLY 1 APPLICATION TOPICALLY 2 TIMES DAILY. (Patient not taking: Reported on 05/30/2019) 454 g 2   cyclobenzaprine (FLEXERIL) 10 MG tablet Take 10 mg by mouth 3 (three) times daily as needed for muscle spasms.     No facility-administered medications prior to visit.    Allergies  Allergen Reactions   Nickel Other (See Comments)    Unknown   Shellfish Allergy Other (See Comments)   Sulfa Antibiotics Other (See Comments)    Unknown   Other Other (See Comments)    Tomatoes, onions, italian   Latex Hives and Rash   Neosporin [Neomycin-Bacitracin Zn-Polymyx] Rash    ROS Review of Systems  Constitutional: Negative.   HENT: Negative.   Eyes: Negative.   Respiratory: Negative.   Cardiovascular: Negative.   Gastrointestinal: Negative.   Endocrine: Negative.   Genitourinary: Negative.   Musculoskeletal: Negative.   Skin: Positive for rash (Psoriasis).  Allergic/Immunologic: Negative.   Neurological: Negative.    Hematological: Negative.   Psychiatric/Behavioral: Negative.       Objective:    Physical Exam  Constitutional: She is oriented to person, place, and time. She appears well-developed and well-nourished.  HENT:  Head: Normocephalic and atraumatic.  Eyes: Conjunctivae are normal.  Cardiovascular: Normal rate, regular rhythm, normal heart sounds and intact distal pulses.  Pulmonary/Chest: Effort normal and breath sounds normal.  Abdominal: Soft. Bowel sounds are normal.  Musculoskeletal:        General: Normal range of motion.     Cervical back: Normal range of motion and neck supple.  Neurological: She is alert and oriented to person, place, and time. She has normal reflexes.  Skin: Rash (chronic psoriasis) noted.  Psychiatric: Judgment and thought content normal.  Anxious r/t chronic psoriasis  Nursing note and vitals reviewed.   BP (!) 156/83    Pulse 100    Temp 98.3 F (36.8 C) (Oral)    Ht '5\' 2"'  (1.575 m)    Wt 118 lb 9.6 oz (53.8 kg)    SpO2 96%    BMI 21.69 kg/m  Wt Readings from Last 3 Encounters:  05/30/19 118 lb 9.6 oz (53.8 kg)  06/20/18 124 lb (56.2 kg)  06/13/18 124 lb (56.2 kg)     Health Maintenance Due  Topic Date Due   Hepatitis C Screening  01[birth year]   HIV Screening  07/13/1976   PAP SMEAR-Modifier  07/13/1982   MAMMOGRAM  07/14/2011   COLONOSCOPY  07/14/2011   INFLUENZA VACCINE  01/07/2019    There are no preventive care reminders to display for this patient.  Lab Results  Component Value Date   TSH 1.585 07/26/2016   Lab Results  Component Value Date   WBC 8.8 12/11/2016   HGB 12.9 12/11/2016   HCT 37.3 12/11/2016   MCV 97.6 12/11/2016   PLT 194 12/11/2016   Lab Results  Component Value Date   NA 131 (L) 12/11/2016   K 3.8 12/11/2016   CO2 20 (L) 12/11/2016   GLUCOSE 95 12/11/2016   BUN 5 (L) 12/11/2016   CREATININE 0.58 12/11/2016   BILITOT 0.4 10/18/2016   ALKPHOS 76 10/18/2016   AST 41 10/18/2016   ALT 28 10/18/2016    PROT 7.8 10/18/2016   ALBUMIN 4.2 10/18/2016   CALCIUM 9.0 12/11/2016   ANIONGAP 11 12/11/2016   No results found for: CHOL No results found for: HDL No results found for: LDLCALC No results found for: TRIG No results found for: CHOLHDL Lab Results  Component Value Date   HGBA1C 5.2 05/30/2019      Assessment & Plan:   1. Psoriasis Chronic. We will initiate emollient cream today. She will follow up with Dermatologist.   - Ambulatory referral to Dermatology - emollient cream; Apply topically daily as needed. Triamcinalone Cream 1%/Cerave Cream 1:1  Dispense: 454 g; Refill: 0  2. Neuropathic pain We will refill Flexeril today.   3. Hypertension, unspecified type Blood pressure is elevated today. She will refrain from smoking and drinking coffee prior to next office appointment. We will continue to decrease high sodium intake, excessive alcohol intake, increase potassium intake, smoking cessation, and increase physical activity of at least 30 minutes of cardio activity daily. She will continue to follow Heart Healthy or DASH diet.  4. Refuses prescribed medications  5. Abnormal urinalysis Results are pending.  - Urine Culture  6. Health care maintenance - POCT HgB A1C - Urinalysis Dipstick - Glucose (CBG) - cyclobenzaprine (FLEXERIL) 10 MG tablet; Take 1 tablet (10 mg total) by mouth 3 (three) times daily as needed for muscle spasms.  Dispense: 30 tablet; Refill: 3 - CBC with Differential - Comp Met (CMET) - Lipid Panel - TSH - Vitamin B12 - Vitamin D, 25-hydroxy  7. Follow up She will follow up in 6 months.   Meds ordered this encounter  Medications   cyclobenzaprine (FLEXERIL) 10 MG tablet    Sig: Take 1 tablet (10 mg total) by mouth 3 (three) times daily as needed for muscle spasms.    Dispense:  30 tablet    Refill:  3   emollient cream    Sig: Apply topically daily as needed. Triamcinalone Cream 1%/Cerave Cream 1:1    Dispense:  454 g  Refill:  0     Orders Placed This Encounter  Procedures   Urine Culture   CBC with Differential   Comp Met (CMET)   Lipid Panel   TSH   Vitamin B12   Vitamin D, 25-hydroxy   Ambulatory referral to Dermatology   POCT HgB A1C   Urinalysis Dipstick   Glucose (CBG)     Referral Orders     Ambulatory referral to Dermatology   Kathe Becton,  MSN, FNP-BC [address], [place] [address] [phone] fax  Problem List Items Addressed This Visit      Cardiovascular and Mediastinum   Hypertension     Musculoskeletal and Integument   Psoriasis - Primary   Relevant Medications   emollient cream   Other Relevant Orders   Ambulatory referral to Dermatology    Other Visit Diagnoses    Abnormal urinalysis       Relevant Orders   Urine Culture   Health care maintenance       Relevant Medications   cyclobenzaprine (FLEXERIL) 10 MG tablet   Other Relevant Orders   POCT HgB A1C (Completed)   Urinalysis Dipstick (Completed)   Glucose (CBG) (Completed)   CBC with Differential   Comp Met (CMET)   Lipid Panel   TSH   Vitamin B12   Vitamin D, 25-hydroxy   Follow up          Meds ordered this encounter  Medications   cyclobenzaprine (FLEXERIL) 10 MG tablet    Sig: Take 1 tablet (10 mg total) by mouth 3 (three) times daily as needed for muscle spasms.    Dispense:  30 tablet    Refill:  3   emollient cream    Sig: Apply topically daily as needed. Triamcinalone Cream 1%/Cerave Cream 1:1    Dispense:  454 g    Refill:  0    Follow-up: Return in about 6 months (around 11/28/2019).    Azzie Glatter, FNP

## 2019-05-31 LAB — CBC WITH DIFFERENTIAL/PLATELET
Basophils Absolute: 0 x10E3/uL (ref 0.0–0.2)
Basos: 1 %
EOS (ABSOLUTE): 0.2 10*3/uL (ref 0.0–0.4)
Eos: 2 %
Hematocrit: 36.8 % (ref 34.0–46.6)
Hemoglobin: 12.8 g/dL (ref 11.1–15.9)
Immature Grans (Abs): 0 x10E3/uL (ref 0.0–0.1)
Immature Granulocytes: 0 %
Lymphocytes Absolute: 2.4 x10E3/uL (ref 0.7–3.1)
Lymphs: 27 %
MCH: 34.3 pg — ABNORMAL HIGH (ref 26.6–33.0)
MCHC: 34.8 g/dL (ref 31.5–35.7)
MCV: 99 fL — ABNORMAL HIGH (ref 79–97)
Monocytes Absolute: 0.9 x10E3/uL (ref 0.1–0.9)
Monocytes: 10 %
Neutrophils Absolute: 5.3 10*3/uL (ref 1.4–7.0)
Neutrophils: 60 %
Platelets: 289 x10E3/uL (ref 150–450)
RBC: 3.73 x10E6/uL — ABNORMAL LOW (ref 3.77–5.28)
RDW: 11.7 % (ref 11.7–15.4)
WBC: 8.8 10*3/uL (ref 3.4–10.8)

## 2019-05-31 LAB — COMPREHENSIVE METABOLIC PANEL WITH GFR
AST: 16 IU/L (ref 0–40)
Albumin/Globulin Ratio: 1.7 (ref 1.2–2.2)
Albumin: 4.6 g/dL (ref 3.8–4.9)
Alkaline Phosphatase: 82 IU/L (ref 39–117)
BUN/Creatinine Ratio: 15 (ref 9–23)
BUN: 14 mg/dL (ref 6–24)
Calcium: 9.8 mg/dL (ref 8.7–10.2)
Chloride: 99 mmol/L (ref 96–106)
GFR calc Af Amer: 80 mL/min/1.73 (ref >59)
Glucose: 107 mg/dL — ABNORMAL HIGH (ref 65–99)
Sodium: 134 mmol/L (ref 134–144)
Total Protein: 7.3 g/dL (ref 6.0–8.5)

## 2019-05-31 LAB — LIPID PANEL
Chol/HDL Ratio: 2.7 ratio (ref 0.0–4.4)
Cholesterol, Total: 192 mg/dL (ref 100–199)
HDL: 72 mg/dL (ref >39)
LDL Chol Calc (NIH): 96 mg/dL (ref 0–99)
Triglycerides: 141 mg/dL (ref 0–149)
VLDL Cholesterol Cal: 24 mg/dL (ref 5–40)

## 2019-05-31 LAB — COMPREHENSIVE METABOLIC PANEL
ALT: 8 IU/L (ref 0–32)
Bilirubin Total: 0.3 mg/dL (ref 0.0–1.2)
CO2: 20 mmol/L (ref 20–29)
Creatinine, Ser: 0.92 mg/dL (ref 0.57–1.00)
GFR calc non Af Amer: 69 mL/min/{1.73_m2} (ref >59)
Globulin, Total: 2.7 g/dL (ref 1.5–4.5)
Potassium: 4.3 mmol/L (ref 3.5–5.2)

## 2019-05-31 LAB — VITAMIN D 25 HYDROXY (VIT D DEFICIENCY, FRACTURES): Vit D, 25-Hydroxy: 6.8 ng/mL — ABNORMAL LOW (ref 30.0–100.0)

## 2019-05-31 LAB — VITAMIN B12: Vitamin B-12: 182 pg/mL — ABNORMAL LOW (ref 232–1245)

## 2019-05-31 LAB — TSH: TSH: 3.35 u[IU]/mL (ref 0.450–4.500)

## 2019-06-01 LAB — URINE CULTURE

## 2019-06-02 ENCOUNTER — Other Ambulatory Visit (INDEPENDENT_AMBULATORY_CARE_PROVIDER_SITE_OTHER): Payer: Self-pay | Admitting: Family Medicine

## 2019-06-08 ENCOUNTER — Other Ambulatory Visit: Payer: Self-pay | Admitting: Family Medicine

## 2019-06-08 ENCOUNTER — Encounter: Payer: Self-pay | Admitting: Family Medicine

## 2019-06-08 DIAGNOSIS — E559 Vitamin D deficiency, unspecified: Secondary | ICD-10-CM

## 2019-06-08 DIAGNOSIS — E538 Deficiency of other specified B group vitamins: Secondary | ICD-10-CM

## 2019-06-08 MED ORDER — VITAMIN D (ERGOCALCIFEROL) 1.25 MG (50000 UNIT) PO CAPS: 50000.0000 [IU] | ORAL_CAPSULE | ORAL | 6 refills | Status: DC

## 2019-06-08 MED ORDER — VITAMIN B-12 100 MCG PO TABS: 100.0000 ug | ORAL_TABLET | Freq: Every day | ORAL | 6 refills | Status: DC

## 2019-06-12 ENCOUNTER — Telehealth: Payer: Self-pay | Admitting: Family Medicine

## 2019-06-12 ENCOUNTER — Other Ambulatory Visit: Payer: Self-pay | Admitting: Family Medicine

## 2019-06-12 ENCOUNTER — Encounter: Payer: Self-pay | Admitting: Family Medicine

## 2019-06-12 DIAGNOSIS — F419 Anxiety disorder, unspecified: Secondary | ICD-10-CM

## 2019-06-12 DIAGNOSIS — F4321 Adjustment disorder with depressed mood: Secondary | ICD-10-CM

## 2019-06-12 MED ORDER — ALPRAZOLAM 0.5 MG PO TABS: 0.5000 mg | ORAL_TABLET | Freq: Every evening | ORAL | 0 refills | Status: DC | PRN

## 2019-06-12 NOTE — Telephone Encounter (Signed)
Done

## 2019-06-13 ENCOUNTER — Other Ambulatory Visit: Payer: Self-pay | Admitting: Family Medicine

## 2019-06-13 DIAGNOSIS — L409 Psoriasis, unspecified: Secondary | ICD-10-CM

## 2019-06-22 ENCOUNTER — Telehealth (INDEPENDENT_AMBULATORY_CARE_PROVIDER_SITE_OTHER): Payer: Self-pay

## 2019-06-22 NOTE — Telephone Encounter (Signed)
PA approved    The Prior Authorization request has been approved for ZolpidemTartrateER12.5MG ORTBCR. The authorization is valid from 05/23/2019 through 06/21/2020. A letter of explanation will also be mailed to the patient.      Approval letter will be mailed to patient and faxed to office.

## 2019-06-22 NOTE — Telephone Encounter (Signed)
PA submitted for Zolpidem Tartrate ER 12.5MG  er tablets    Key: [id number] - PA Case ID: [id number]       PA sent to plan - pending response.

## 2019-06-28 DIAGNOSIS — Z1152 Encounter for screening for COVID-19: Secondary | ICD-10-CM | POA: Diagnosis not present

## 2019-06-28 DIAGNOSIS — J029 Acute pharyngitis, unspecified: Secondary | ICD-10-CM | POA: Diagnosis not present

## 2019-07-10 ENCOUNTER — Telehealth: Payer: Self-pay | Admitting: Family Medicine

## 2019-07-10 NOTE — Telephone Encounter (Signed)
Patient has requested a refill of her skin compound cream and Xanax. Please advise.

## 2019-07-11 ENCOUNTER — Other Ambulatory Visit: Payer: Self-pay | Admitting: Family Medicine

## 2019-07-11 DIAGNOSIS — G4700 Insomnia, unspecified: Secondary | ICD-10-CM

## 2019-07-11 DIAGNOSIS — F419 Anxiety disorder, unspecified: Secondary | ICD-10-CM

## 2019-07-11 MED ORDER — TRAZODONE HCL 50 MG PO TABS: 50.0000 mg | ORAL_TABLET | Freq: Every day | ORAL | 3 refills | Status: DC

## 2019-07-19 DIAGNOSIS — B029 Zoster without complications: Secondary | ICD-10-CM | POA: Diagnosis not present

## 2019-07-19 DIAGNOSIS — Z79899 Other long term (current) drug therapy: Secondary | ICD-10-CM | POA: Diagnosis not present

## 2019-07-19 DIAGNOSIS — L2089 Other atopic dermatitis: Secondary | ICD-10-CM | POA: Diagnosis not present

## 2019-07-24 DIAGNOSIS — M5442 Lumbago with sciatica, left side: Secondary | ICD-10-CM | POA: Diagnosis not present

## 2019-08-22 DIAGNOSIS — A09 Infectious gastroenteritis and colitis, unspecified: Secondary | ICD-10-CM | POA: Diagnosis not present

## 2019-08-22 DIAGNOSIS — Z20822 Contact with and (suspected) exposure to covid-19: Secondary | ICD-10-CM | POA: Diagnosis not present

## 2019-10-04 ENCOUNTER — Other Ambulatory Visit (HOSPITAL_COMMUNITY): Payer: Self-pay | Admitting: Neurological Surgery

## 2019-10-04 ENCOUNTER — Other Ambulatory Visit: Payer: Self-pay | Admitting: Neurological Surgery

## 2019-10-04 DIAGNOSIS — M4712 Other spondylosis with myelopathy, cervical region: Secondary | ICD-10-CM

## 2019-10-04 DIAGNOSIS — M7918 Myalgia, other site: Secondary | ICD-10-CM

## 2019-10-25 ENCOUNTER — Ambulatory Visit (HOSPITAL_COMMUNITY): Payer: Medicaid Other

## 2019-10-25 ENCOUNTER — Encounter (HOSPITAL_COMMUNITY): Payer: Self-pay

## 2019-10-25 ENCOUNTER — Ambulatory Visit (HOSPITAL_COMMUNITY): Admission: RE | Admit: 2019-10-25 | Payer: Medicaid Other | Source: Ambulatory Visit

## 2019-11-27 ENCOUNTER — Ambulatory Visit: Payer: Medicaid Other | Admitting: Family Medicine

## 2019-12-08 ENCOUNTER — Ambulatory Visit: Payer: Medicaid Other | Admitting: Family Medicine

## 2019-12-20 ENCOUNTER — Other Ambulatory Visit (INDEPENDENT_AMBULATORY_CARE_PROVIDER_SITE_OTHER): Payer: Self-pay | Admitting: Family Medicine

## 2019-12-20 DIAGNOSIS — G4700 Insomnia, unspecified: Secondary | ICD-10-CM

## 2019-12-21 MED ORDER — ZOLPIDEM TARTRATE ER 12.5 MG PO TBCR
12.5000 mg | EXTENDED_RELEASE_TABLET | Freq: Every evening | ORAL | 0 refills | Status: DC | PRN
Start: 2019-12-21 — End: 2020-01-05

## 2019-12-21 NOTE — Telephone Encounter (Signed)
Lmtcb.

## 2019-12-21 NOTE — Telephone Encounter (Signed)
1st COURTES[facility] 30 day supply 0 refill pended for zolpidem (AMBIEN CR) 12.5 MG CR tablet    Overdue for f/u, noted on Rx    Please assess    Thanks

## 2020-01-03 ENCOUNTER — Encounter (INDEPENDENT_AMBULATORY_CARE_PROVIDER_SITE_OTHER): Payer: Self-pay | Admitting: Family Medicine

## 2020-01-03 DIAGNOSIS — H5213 Myopia, bilateral: Secondary | ICD-10-CM | POA: Diagnosis not present

## 2020-01-05 ENCOUNTER — Encounter (INDEPENDENT_AMBULATORY_CARE_PROVIDER_SITE_OTHER): Payer: Self-pay | Admitting: Family Medicine

## 2020-01-05 ENCOUNTER — Ambulatory Visit (INDEPENDENT_AMBULATORY_CARE_PROVIDER_SITE_OTHER): Payer: BLUE CROSS/BLUE SHIELD | Admitting: Family Medicine

## 2020-01-05 VITALS — BP 122/84 | HR 92 | Temp 97.4°F | Ht 66.0 in | Wt 199.0 lb

## 2020-01-05 DIAGNOSIS — L259 Unspecified contact dermatitis, unspecified cause: Secondary | ICD-10-CM | POA: Insufficient documentation

## 2020-01-05 DIAGNOSIS — G4733 Obstructive sleep apnea (adult) (pediatric): Secondary | ICD-10-CM | POA: Insufficient documentation

## 2020-01-05 DIAGNOSIS — G4700 Insomnia, unspecified: Secondary | ICD-10-CM

## 2020-01-05 DIAGNOSIS — Z0000 Encounter for general adult medical examination without abnormal findings: Secondary | ICD-10-CM

## 2020-01-05 DIAGNOSIS — Z6832 Body mass index (BMI) 32.0-32.9, adult: Secondary | ICD-10-CM

## 2020-01-05 DIAGNOSIS — M722 Plantar fascial fibromatosis: Secondary | ICD-10-CM | POA: Insufficient documentation

## 2020-01-05 DIAGNOSIS — E7800 Pure hypercholesterolemia, unspecified: Secondary | ICD-10-CM

## 2020-01-05 MED ORDER — CLONAZEPAM 0.5 MG PO TABS
ORAL_TABLET | ORAL | 5 refills | Status: DC
Start: 2020-01-05 — End: 2020-09-18

## 2020-01-05 MED ORDER — PHENTERMINE HCL 37.5 MG PO CAPS
37.5000 mg | ORAL_CAPSULE | Freq: Every morning | ORAL | 5 refills | Status: DC
Start: 2020-01-05 — End: 2020-09-11

## 2020-01-05 MED ORDER — ZOLPIDEM TARTRATE ER 12.5 MG PO TBCR
12.5000 mg | EXTENDED_RELEASE_TABLET | Freq: Every evening | ORAL | 5 refills | Status: DC | PRN
Start: 2020-01-05 — End: 2020-07-05

## 2020-01-05 NOTE — Progress Notes (Signed)
[facility]                       Date of Exam: 01/05/2020 10:39 AM        Patient ID: Melinda Brown is a 59 y.o. female.  Attending Physician: Theresa Mulligan, MD        Chief Complaint:    Chief Complaint   Patient presents with    Medication Review               HPI:    60 year old divorced white female for physical exam GYN: [name] Prescription medication: Clonazepam 0.5 mg as needed, zolpidem CR 12.5 mg as needed, phentermine 37.5 mg as needed no known drug allergy No tobacco/daily alcohol Past medical history: Insomnia/restless leg syndrome Past surgical history: Appendectomy Cardiac risk factor: Female greater than 55, BMI greater than 30, hyperlipidemia (LDL) family history: No colon cancer Tdap 09/24/2016, Shingrix (02/16/2019, 04/29/2019), Pfizer (09/04/2019, 421/21) Colonoscopy reportedly negative... Mammography 2021 benign          Problem List:    Patient Active Problem List   Diagnosis    Insomnia    Abnormal weight gain    Contact dermatitis    Coronary atherosclerosis due to calcified coronary lesion    Cystitis    Depressive disorder    Encounter for weight loss counseling    Mixed hyperlipidemia    Obesity    Obstructive sleep apnea syndrome    Overweight    Plantar fasciitis    Pure hypercholesterolemia, unspecified    Snoring    Serum creatinine raised    Thoracic aortic aneurysm, without rupture             Current Meds:    Outpatient Medications Marked as Taking for the 01/05/20 encounter (Office Visit) with Theresa Mulligan, MD   Medication Sig Dispense Refill    clonazePAM (KlonoPIN) 0.5 MG tablet TAKE ONE TABLET BY MOUTH AT BEDTIME AS NEEDED. 30 tablet 5    zolpidem (AMBIEN CR) 12.5 MG CR tablet Take 1 tablet (12.5 mg total) by mouth nightly as needed for Sleep 30 tablet 5    [DISCONTINUED] clonazePAM (KlonoPIN) 0.5 MG tablet TAKE ONE TABLET BY MOUTH AT BEDTIME AS NEEDED. 30 tablet 4    [DISCONTINUED] zolpidem (AMBIEN CR)  12.5 MG CR tablet Take 1 tablet (12.5 mg total) by mouth nightly as needed for Sleep 30 tablet 0          Allergies:    Allergies   Allergen Reactions    Poison Ivy Extract              Past Surgical History:    Past Surgical History:   Procedure Laterality Date    APPENDECTOMY  06/08/2001    BRAIN SURGERY  04/08/2006    COLONOSCOPY  07/10/2011    VASCULAR SURGERY  10/06/2000           Family History:    Family History   Problem Relation Age of Onset    Heart disease Mother     Asthma Daughter         OLDEST           Social History:    Social History     Tobacco Use    Smoking status: Never Smoker    Smokeless tobacco: Never Used   Haematologist Use: Never used   Substance Use Topics  Alcohol use: Yes     Alcohol/week: 6.0 standard drinks     Types: 6 Standard drinks or equivalent per week    Drug use: Never          The following sections were reviewed this encounter by the provider:            Vital Signs:    BP 122/84 (BP Site: Left arm, Patient Position: Sitting, Cuff Size: Large)    Pulse 92    Temp 97.4 F (36.3 C) (Tympanic)    Ht 1.676 m (5\' 6" )    Wt 90.3 kg (199 lb)    SpO2 97%    BMI 32.12 kg/m          ROS:    Review of Systems   Constitutional: Negative.    HENT: Negative.    Eyes: Negative.    Respiratory: Negative.    Cardiovascular: Negative.    Gastrointestinal: Negative.    Endocrine: Negative.    Genitourinary: Negative.    Musculoskeletal: Negative.    Skin: Negative.    Allergic/Immunologic: Negative.    Neurological: Negative.    Hematological: Negative.    Psychiatric/Behavioral: Negative.    All other systems reviewed and are negative.             Physical Exam:    Physical Exam  Vitals and nursing note reviewed.   Constitutional:       Appearance: Normal appearance. She is normal weight.   HENT:      Head: Normocephalic and atraumatic.      Right Ear: Tympanic membrane, ear canal and external ear normal.      Left Ear: Tympanic membrane, ear canal and external ear  normal.   Eyes:      Extraocular Movements: Extraocular movements intact.      Conjunctiva/sclera: Conjunctivae normal.      Pupils: Pupils are equal, round, and reactive to light.   Cardiovascular:      Rate and Rhythm: Normal rate and regular rhythm.      Pulses: Normal pulses.      Heart sounds: Normal heart sounds.   Pulmonary:      Effort: Pulmonary effort is normal.      Breath sounds: Normal breath sounds.   Abdominal:      General: Abdomen is flat. Bowel sounds are normal.      Palpations: Abdomen is soft.   Musculoskeletal:         General: Normal range of motion.      Cervical back: Normal range of motion and neck supple.   Skin:     General: Skin is warm and dry.   Neurological:      General: No focal deficit present.      Mental Status: She is alert and oriented to person, place, and time. Mental status is at baseline.   Psychiatric:         Mood and Affect: Mood normal.         Behavior: Behavior normal.         Thought Content: Thought content normal.         Judgment: Judgment normal.              Assessment:    1. Health care maintenance    2. Pure hypercholesterolemia    3. Insomnia, unspecified type  - zolpidem (AMBIEN CR) 12.5 MG CR tablet; Take 1 tablet (12.5 mg total) by mouth nightly as needed for Sleep  Dispense:  30 tablet; Refill: 5  - clonazePAM (KlonoPIN) 0.5 MG tablet; TAKE ONE TABLET BY MOUTH AT BEDTIME AS NEEDED.  Dispense: 30 tablet; Refill: 5    4. BMI 32.0-32.9,adult  - phentermine 37.5 MG capsule; Take 1 capsule (37.5 mg total) by mouth every morning  Dispense: 30 capsule; Refill: 5            Plan:    See assessment/orders Discussed outside lab from 10/20/2019, update colonoscopy minimum every 10 years, annual physical exam            Follow-up:    No follow-ups on file.         Theresa Mulligan, MD

## 2020-01-10 ENCOUNTER — Other Ambulatory Visit: Payer: Self-pay | Admitting: Family Medicine

## 2020-01-10 DIAGNOSIS — F419 Anxiety disorder, unspecified: Secondary | ICD-10-CM

## 2020-01-10 DIAGNOSIS — G4700 Insomnia, unspecified: Secondary | ICD-10-CM

## 2020-01-12 ENCOUNTER — Encounter (INDEPENDENT_AMBULATORY_CARE_PROVIDER_SITE_OTHER): Payer: Self-pay | Admitting: Family Medicine

## 2020-02-20 ENCOUNTER — Ambulatory Visit (INDEPENDENT_AMBULATORY_CARE_PROVIDER_SITE_OTHER): Payer: BLUE CROSS/BLUE SHIELD | Admitting: Family Medicine

## 2020-02-20 ENCOUNTER — Encounter (INDEPENDENT_AMBULATORY_CARE_PROVIDER_SITE_OTHER): Payer: Self-pay | Admitting: Family Medicine

## 2020-02-20 VITALS — BP 110/80 | HR 77 | Temp 97.5°F | Wt 198.0 lb

## 2020-02-20 DIAGNOSIS — J0100 Acute maxillary sinusitis, unspecified: Secondary | ICD-10-CM

## 2020-02-20 DIAGNOSIS — R05 Cough: Secondary | ICD-10-CM

## 2020-02-20 DIAGNOSIS — R059 Cough, unspecified: Secondary | ICD-10-CM

## 2020-02-20 LAB — POCT INFLUENZA A/B
POCT Rapid Influenza A AG: NEGATIVE
POCT Rapid Influenza B AG: NEGATIVE

## 2020-02-20 LAB — IHS AMB POCT SOFIA (TM) SARS CORONAVIRUS ANTIGEN FIA: Sofia SARS COV2 Antigen POCT: NEGATIVE

## 2020-02-20 MED ORDER — CEFUROXIME AXETIL 500 MG PO TABS
500.0000 mg | ORAL_TABLET | Freq: Two times a day (BID) | ORAL | 0 refills | Status: AC
Start: 2020-02-20 — End: 2020-03-01

## 2020-02-20 NOTE — Progress Notes (Signed)
[facility]                       Date of Exam: 02/20/2020 4:41 PM        Patient ID: Melinda Brown is a 59 y.o. female.  Attending Physician: Dorris Singh, MD        Chief Complaint:    Chief Complaint   Patient presents with    Cough               HPI:    Pst [place] music festival in [place].  COVID testing negative 9/10, but sxs worsening      Cough  This is a new problem. The current episode started 1 to 4 weeks ago (since Labor day music festival  ). The problem has been gradually worsening. The problem occurs constantly. The cough is productive of sputum. Associated symptoms include a fever, nasal congestion, postnasal drip, rhinorrhea, a sore throat, shortness of breath and wheezing. Pertinent negatives include no chills. She has tried rest for the symptoms. The treatment provided mild relief. Her past medical history is significant for environmental allergies. There is no history of asthma.             Problem List:    Patient Active Problem List   Diagnosis    Insomnia    Abnormal weight gain    Contact dermatitis    Coronary atherosclerosis due to calcified coronary lesion    Cystitis    Depressive disorder    Encounter for weight loss counseling    Mixed hyperlipidemia    Obesity    Obstructive sleep apnea syndrome    Overweight    Plantar fasciitis    Pure hypercholesterolemia, unspecified    Snoring    Serum creatinine raised    Thoracic aortic aneurysm, without rupture             Current Meds:    No outpatient medications have been marked as taking for the 02/20/20 encounter (Office Visit) with Dorris Singh, MD.          Allergies:    Allergies   Allergen Reactions    Poison Ivy Extract              Past Surgical History:    Past Surgical History:   Procedure Laterality Date    APPENDECTOMY  06/08/2001    BRAIN SURGERY  04/08/2006    COLONOSCOPY  07/10/2011    VASCULAR SURGERY  10/06/2000           Family History:     Family History   Problem Relation Age of Onset    Heart disease Mother     Asthma Daughter         OLDEST           Social History:    Social History     Tobacco Use    Smoking status: Never Smoker    Smokeless tobacco: Never Used   Haematologist Use: Never used   Substance Use Topics    Alcohol use: Yes     Alcohol/week: 6.0 standard drinks     Types: 6 Standard drinks or equivalent per week    Drug use: Never          The following sections were reviewed this encounter by the provider:   Tobacco   Allergies   Meds   Problems  Med Hx   Surg Hx   Fam Hx              Vital Signs:    BP 110/80    Pulse 77    Temp 97.5 F (36.4 C) (Tympanic)    Wt 89.8 kg (198 lb)    SpO2 98%    BMI 31.96 kg/m          ROS:    Review of Systems   Constitutional: Positive for fever. Negative for chills.   HENT: Positive for congestion (upper tooth ache L > R  ), postnasal drip, rhinorrhea, sinus pressure, sinus pain, sore throat and voice change.    Respiratory: Positive for cough (dry secondary postnasal drip), shortness of breath and wheezing.    Allergic/Immunologic: Positive for environmental allergies.              Physical Exam:    Physical Exam  Vitals and nursing note reviewed.   Constitutional:       General: She is not in acute distress.     Appearance: Normal appearance. She is not toxic-appearing.      Comments: Middle age 87     HENT:      Head: Normocephalic and atraumatic.      Right Ear: Tympanic membrane and ear canal normal. There is no impacted cerumen.      Left Ear: Tympanic membrane and ear canal normal. There is no impacted cerumen.      Nose: Congestion and rhinorrhea (-Yellowish golden colored thin mucus bilateral nasal passages with moderate congestion.) present.      Mouth/Throat:      Mouth: Mucous membranes are moist.      Pharynx: Posterior oropharyngeal erythema present. No oropharyngeal exudate.      Comments: Left greater than right maxillary sinus tenderness.  Eyes:      General: No  scleral icterus.     Extraocular Movements: Extraocular movements intact.   Cardiovascular:      Rate and Rhythm: Normal rate and regular rhythm.      Heart sounds: Normal heart sounds. No murmur heard.     Pulmonary:      Effort: Pulmonary effort is normal. No respiratory distress.      Breath sounds: Normal breath sounds. No wheezing, rhonchi or rales.   Abdominal:      General: Abdomen is flat. There is no distension.   Musculoskeletal:         General: No swelling.      Cervical back: Neck supple.      Right lower leg: No edema.      Left lower leg: No edema.   Lymphadenopathy:      Cervical: No cervical adenopathy.   Skin:     Coloration: Skin is not jaundiced.      Findings: No rash.   Neurological:      General: No focal deficit present.      Mental Status: She is alert and oriented to person, place, and time.   Psychiatric:         Mood and Affect: Mood normal.         Behavior: Behavior normal.              Assessment:    1. Acute non-recurrent maxillary sinusitis  - cefuroxime (CEFTIN) 500 MG tablet; Take 1 tablet (500 mg total) by mouth 2 (two) times daily for 10 days  Dispense: 20 tablet; Refill: 0    2. Cough  -  Sofia(TM)SARS COVID-19 Antigen FIA POCT  - Coronavirus COVID-19  - POCT Influenza A/B            Plan:    Symp RX  for acute sinusitis -  Rest Fluids, lozenges for cough ST decongestant muccolytics RX Ceftin 500 mg BID x 10 days    Covid PCR pending -    Call for nonresolution or persistence.   Contingency Albuterol inhaler, steroids, ER eval - CXR xray eval.  Virtual visits              Follow-up:    No follow-ups on file.         Dorris Singh, MD

## 2020-02-22 LAB — CORONAVIRUS, COVID-19: SARS-CoV-2: NOT DETECTED

## 2020-02-22 LAB — SARS-COV-2, NAA 2 DAY TAT

## 2020-02-22 NOTE — Progress Notes (Signed)
Your Covid test results appear negative.  Please continue with the plan we discussed at your visit. Follow up for worsening or persistent symptoms or if you have any questions requiring medical consultation.  Dr. Kjirsten Bloodgood A Taylin Mans

## 2020-04-18 ENCOUNTER — Other Ambulatory Visit (INDEPENDENT_AMBULATORY_CARE_PROVIDER_SITE_OTHER): Payer: Self-pay | Admitting: Family Medicine

## 2020-04-18 DIAGNOSIS — Z6832 Body mass index (BMI) 32.0-32.9, adult: Secondary | ICD-10-CM

## 2020-04-23 NOTE — Telephone Encounter (Signed)
Phentermine requires f/u OV every 3 months    Pt last evaluated on 01/05/2020 - 6 month supply sent to [facility] pharmacy.    No refills needed at this time as pt has enough refills at the pharmacy to last until 06/2020.    Contacted pharmacy and left a vm notifying them to process a refill for pt.    Unable to speak with a live person.

## 2020-06-15 ENCOUNTER — Other Ambulatory Visit: Payer: Self-pay | Admitting: Family Medicine

## 2020-06-15 DIAGNOSIS — F419 Anxiety disorder, unspecified: Secondary | ICD-10-CM

## 2020-06-15 DIAGNOSIS — G4700 Insomnia, unspecified: Secondary | ICD-10-CM

## 2020-06-17 ENCOUNTER — Telehealth: Payer: Self-pay | Admitting: Family Medicine

## 2020-06-17 NOTE — Telephone Encounter (Signed)
Is this okay to refill? 

## 2020-06-17 NOTE — Telephone Encounter (Signed)
Multiple attempts to contact patient concerning her needing follow up appointment for medication refills. Unable to leave voice message.

## 2020-06-18 ENCOUNTER — Telehealth (INDEPENDENT_AMBULATORY_CARE_PROVIDER_SITE_OTHER): Payer: Self-pay

## 2020-06-18 NOTE — Telephone Encounter (Signed)
Zolpidem Tartrate ER 12.5MG  er tablets  Key: [id number]   PA Case ID: [id number]  Prior authorization submitted using Covermymeds  APPROVED  05/19/2020 - 06/18/2021

## 2020-06-19 ENCOUNTER — Encounter (INDEPENDENT_AMBULATORY_CARE_PROVIDER_SITE_OTHER): Payer: Self-pay

## 2020-07-04 ENCOUNTER — Other Ambulatory Visit (INDEPENDENT_AMBULATORY_CARE_PROVIDER_SITE_OTHER): Payer: Self-pay | Admitting: Family Medicine

## 2020-07-04 DIAGNOSIS — G4700 Insomnia, unspecified: Secondary | ICD-10-CM

## 2020-07-04 NOTE — Telephone Encounter (Signed)
Please see pended rx    Zolpidem 12.5 mg #30 no refills  Last filled 01/05/2020     Pt last seen 02/20/2020 OV for cough. Pt had medication review 01/05/2020

## 2020-07-05 DIAGNOSIS — I10 Essential (primary) hypertension: Secondary | ICD-10-CM | POA: Diagnosis not present

## 2020-07-05 DIAGNOSIS — G5601 Carpal tunnel syndrome, right upper limb: Secondary | ICD-10-CM | POA: Diagnosis not present

## 2020-07-05 DIAGNOSIS — M4712 Other spondylosis with myelopathy, cervical region: Secondary | ICD-10-CM | POA: Diagnosis not present

## 2020-08-06 ENCOUNTER — Telehealth: Payer: Self-pay

## 2020-08-06 NOTE — Telephone Encounter (Signed)
Called pt to schedule appt . Number on file just rang no answer no v/m

## 2020-08-06 NOTE — Telephone Encounter (Signed)
Referral to dermatologics   Dr Cheree Ditto in [facility]  Refill on Cream  Call home number.

## 2020-08-21 ENCOUNTER — Encounter (INDEPENDENT_AMBULATORY_CARE_PROVIDER_SITE_OTHER): Payer: Self-pay | Admitting: Family Medicine

## 2020-08-21 ENCOUNTER — Ambulatory Visit (INDEPENDENT_AMBULATORY_CARE_PROVIDER_SITE_OTHER): Payer: Medicaid Other | Admitting: Family Medicine

## 2020-08-21 ENCOUNTER — Other Ambulatory Visit: Payer: Self-pay | Admitting: Neurological Surgery

## 2020-08-21 ENCOUNTER — Encounter: Payer: Self-pay | Admitting: Family Medicine

## 2020-08-21 ENCOUNTER — Other Ambulatory Visit: Payer: Self-pay

## 2020-08-21 VITALS — BP 147/76 | HR 94 | Temp 97.9°F | Ht 62.0 in | Wt 118.0 lb

## 2020-08-21 DIAGNOSIS — L409 Psoriasis, unspecified: Secondary | ICD-10-CM

## 2020-08-21 DIAGNOSIS — G4700 Insomnia, unspecified: Secondary | ICD-10-CM

## 2020-08-21 DIAGNOSIS — F419 Anxiety disorder, unspecified: Secondary | ICD-10-CM

## 2020-08-21 DIAGNOSIS — I10 Essential (primary) hypertension: Secondary | ICD-10-CM

## 2020-08-21 DIAGNOSIS — M4712 Other spondylosis with myelopathy, cervical region: Secondary | ICD-10-CM

## 2020-08-21 DIAGNOSIS — Z09 Encounter for follow-up examination after completed treatment for conditions other than malignant neoplasm: Secondary | ICD-10-CM

## 2020-08-21 DIAGNOSIS — F4321 Adjustment disorder with depressed mood: Secondary | ICD-10-CM

## 2020-08-21 DIAGNOSIS — Z0000 Encounter for general adult medical examination without abnormal findings: Secondary | ICD-10-CM

## 2020-08-21 MED ORDER — TRAZODONE HCL 50 MG PO TABS: 25.0000 mg | ORAL_TABLET | Freq: Every evening | ORAL | 11 refills | Status: DC | PRN

## 2020-08-21 MED ORDER — TRIAMCINOLONE ACETONIDE 0.1 % EX CREA: TOPICAL_CREAM | CUTANEOUS | 2 refills | Status: DC

## 2020-08-21 NOTE — Progress Notes (Signed)
[facility]   Re-establish Care  Subjective:  Patient ID: Lynn Black, female    DOB: Nov 03, 1960  Age: 60 y.o. MRN: [id number]  CC:  Chief Complaint  Patient presents with  . Follow-up    Discuss getting a referral to derm , refill on medication     HPI Lynn Black is a 60 year old female who presents to Re-establish Care today.   Patient Active Problem List   Diagnosis Date Noted  . Hypertension 09/12/2018  . Tobacco dependence 10/15/2017  . Pruritus 10/15/2017  . Neuropathic pain 10/15/2017  . Psoriasis 03/16/2017  . Cervical arthritis with myelopathy 10/26/2016   Current Status: Lynn Black was previously seeing me for her PCP needs until she was loss to follow up. She continues to have mild psoriasis. Since her last office visit, she is doing well with no complaints. She denies fevers, chills, fatigue, recent infections, weight loss, and night sweats. She has not had any headaches, visual changes, dizziness, and falls. No chest pain, heart palpitations, cough and shortness of breath reported. Denies GI problems such as nausea, vomiting, diarrhea, and constipation. She has no reports of blood in stools, dysuria and hematuria. No depression or anxiety reported today. She is taking all medications as prescribed. She denies pain today.   Past Medical History:  Diagnosis Date  . Eczema   . Heart murmur    " nothing to wory about"  . Pruritus   . Psoriasis   . Seasonal allergies   . Smoking   . Vitamin B12 deficiency 05/2019  . Vitamin D deficiency 05/2019    Past Surgical History:  Procedure Laterality Date  . ANTERIOR CERVICAL DECOMP/DISCECTOMY FUSION N/A 10/26/2016   Procedure: Cervical four-five, Cervical five-six  Anterior cervical decompression/discectomy/fusion;  Surgeon: Barnett Abu, MD;  Location: [facility] OR;  Service: Neurosurgery;  Laterality: N/A;  . CERVICAL DISCECTOMY    . ROTATOR CUFF REPAIR Right     Family  History  Family history unknown: Yes    Social History   Socioeconomic History  . Marital status: Divorced    Spouse name: Not on file  . Number of children: Not on file  . Years of education: Not on file  . Highest education level: Not on file  Occupational History  . Not on file  Tobacco Use  . Smoking status: Current Every Day Smoker    Packs/day: 0.50    Years: 30.00    Pack years: 15.00    Types: Cigarettes  . Smokeless tobacco: Never Used  Vaping Use  . Vaping Use: Never used  Substance and Sexual Activity  . Alcohol use: Yes    Alcohol/week: 2.0 standard drinks    Types: 2 Glasses of wine per week  . Drug use: No  . Sexual activity: Not Currently  Other Topics Concern  . Not on file  Social History Narrative  . Not on file   Social Determinants of Health   Financial Resource Strain: Not on file  Food Insecurity: Not on file  Transportation Needs: Not on file  Physical Activity: Not on file  Stress: Not on file  Social Connections: Not on file  Intimate Partner Violence: Not on file    Outpatient Medications Prior to Visit  Medication Sig Dispense Refill  . diphenhydrAMINE (BENADRYL) 25 MG tablet Take 25 mg by mouth 2 (two) times daily as needed for allergies or sleep.     Marland Kitchen gabapentin (NEURONTIN) 100  MG capsule Take 1 capsule (100 mg total) by mouth 3 (three) times daily. 90 capsule 0  . ALPRAZolam (XANAX) 0.5 MG tablet Take 1 tablet (0.5 mg total) by mouth at bedtime as needed for anxiety. (Patient not taking: Reported on 10/18/2019) 30 tablet 0  . cyclobenzaprine (FLEXERIL) 10 MG tablet Take 1 tablet (10 mg total) by mouth 3 (three) times daily as needed for muscle spasms. (Patient not taking: Reported on 10/18/2019) 30 tablet 3  . fluticasone (FLONASE) 50 MCG/ACT nasal spray Place 2 sprays into both nostrils daily. (Patient not taking: Reported on 08/24/2018) 16 g 6  . hydrOXYzine (ATARAX/VISTARIL) 25 MG tablet Take 1 tablet (25 mg total) by mouth every 6  (six) hours as needed for itching (Rash). (Patient not taking: No sig reported) 60 tablet 1  . loratadine (CLARITIN) 10 MG tablet Take 1 tablet (10 mg total) by mouth daily. (Patient not taking: Reported on 05/30/2019) 30 tablet 11  . mometasone (ELOCON) 0.1 % ointment Apply topically daily. (Patient not taking: No sig reported) 45 g 2  . sodium chloride (OCEAN) 0.65 % SOLN nasal spray Place 1 spray into both nostrils as needed for congestion. (Patient not taking: No sig reported) 1 Bottle 6  . traZODone (DESYREL) 50 MG tablet TAKE 1 TABLET(50 MG) BY MOUTH AT BEDTIME 30 tablet 3  . triamcinolone cream (KENALOG) 0.1 % APPLY 1 APPLICATION TOPICALLY 2 TIMES DAILY. (Patient not taking: No sig reported) 454 g 2  . vitamin B-12 (CYANOCOBALAMIN) 100 MCG tablet Take 1 tablet (100 mcg total) by mouth daily. (Patient not taking: Reported on 10/18/2019) 30 tablet 6  . Vitamin D, Ergocalciferol, (DRISDOL) 1.25 MG (50000 UT) CAPS capsule Take 1 capsule (50,000 Units total) by mouth every 7 (seven) days. (Patient not taking: Reported on 10/18/2019) 5 capsule 6   No facility-administered medications prior to visit.    Allergies  Allergen Reactions  . Nickel Other (See Comments)    Unknown  . Shellfish Allergy Other (See Comments)  . Sulfa Antibiotics Other (See Comments)    Unknown  . Other Other (See Comments)    Tomatoes, onions, [place]  . Contrast Media [Iodinated Diagnostic Agents] Swelling    Facial swelling   . Latex Hives and Rash  . Neosporin [Neomycin-Bacitracin Zn-Polymyx] Rash    ROS Review of Systems  Constitutional: Negative.   HENT: Negative.   Eyes: Negative.   Respiratory: Negative.   Cardiovascular: Negative.   Gastrointestinal: Negative.   Endocrine: Negative.   Genitourinary: Negative.   Musculoskeletal: Negative.   Skin: Positive for rash (hand).  Allergic/Immunologic: Negative.   Neurological: Positive for dizziness (occasional) and headaches (occasional ).   Hematological: Negative.   Psychiatric/Behavioral: Negative.   All other systems reviewed and are negative.     Objective:    Physical Exam Vitals and nursing note reviewed.  Constitutional:      Appearance: Normal appearance.  HENT:     Head: Normocephalic and atraumatic.     Nose: Nose normal.     Mouth/Throat:     Mouth: Mucous membranes are moist.     Pharynx: Oropharynx is clear.  Cardiovascular:     Rate and Rhythm: Normal rate and regular rhythm.     Pulses: Normal pulses.     Heart sounds: Normal heart sounds.  Pulmonary:     Effort: Pulmonary effort is normal.     Breath sounds: Normal breath sounds.  Abdominal:     General: Bowel sounds are normal.  Palpations: Abdomen is soft.  Musculoskeletal:        General: Normal range of motion.     Cervical back: Normal range of motion and neck supple.  Skin:    General: Skin is warm and dry.  Neurological:     General: No focal deficit present.     Mental Status: She is alert and oriented to person, place, and time.  Psychiatric:        Mood and Affect: Mood normal.        Behavior: Behavior normal.        Thought Content: Thought content normal.        Judgment: Judgment normal.     BP (!) 147/76 (BP Location: Left Arm, Patient Position: Sitting, Cuff Size: Normal)   Pulse 94   Temp 97.9 F (36.6 C) (Temporal)   Ht 5\' 2"  (1.575 m)   Wt 118 lb (53.5 kg)   SpO2 97%   BMI 21.58 kg/m  Wt Readings from Last 3 Encounters:  08/21/20 118 lb (53.5 kg)  05/30/19 118 lb 9.6 oz (53.8 kg)  06/20/18 124 lb (56.2 kg)     Health Maintenance Due  Topic Date Due  . Hepatitis C Screening  Never done  . HIV Screening  Never done  . PAP SMEAR-Modifier  Never done  . COLONOSCOPY (Pts 45-21yrs Insurance coverage will need to be confirmed)  Never done  . MAMMOGRAM  Never done    There are no preventive care reminders to display for this patient.  Lab Results  Component Value Date   TSH 3.350 05/30/2019   Lab  Results  Component Value Date   WBC 8.8 05/30/2019   HGB 12.8 05/30/2019   HCT 36.8 05/30/2019   MCV 99 (H) 05/30/2019   PLT 289 05/30/2019   Lab Results  Component Value Date   NA 134 05/30/2019   K 4.3 05/30/2019   CO2 20 05/30/2019   GLUCOSE 107 (H) 05/30/2019   BUN 14 05/30/2019   CREATININE 0.92 05/30/2019   BILITOT 0.3 05/30/2019   ALKPHOS 82 05/30/2019   AST 16 05/30/2019   ALT 8 05/30/2019   PROT 7.3 05/30/2019   ALBUMIN 4.6 05/30/2019   CALCIUM 9.8 05/30/2019   ANIONGAP 11 12/11/2016   Lab Results  Component Value Date   CHOL 192 05/30/2019   Lab Results  Component Value Date   HDL 72 05/30/2019   Lab Results  Component Value Date   LDLCALC 96 05/30/2019   Lab Results  Component Value Date   TRIG 141 05/30/2019   Lab Results  Component Value Date   CHOLHDL 2.7 05/30/2019   Lab Results  Component Value Date   HGBA1C 5.2 05/30/2019   Assessment & Plan:   1. Hypertension, unspecified type She denies visual changes, chest pain, cough, shortness of breath, heart palpitations, and falls. She has occasional headaches and dizziness with position changes. Denies severe headaches, confusion, seizures, double vision, and blurred vision, nausea and vomiting.  2. Psoriasis - triamcinolone (KENALOG) 0.1 %; APPLY 1 APPLICATION TOPICALLY 2 TIMES DAILY.  Dispense: 454 g; Refill: 2 - Ambulatory referral to Dermatology  3. Insomnia, unspecified type - traZODone (DESYREL) 50 MG tablet; Take 0.5-1 tablets (25-50 mg total) by mouth at bedtime as needed for sleep.  Dispense: 30 tablet; Refill: 11  4. Anxiety Stable today.   5. Grieving Recent death of roommate.   6. Healthcare maintenance - CBC with Differential - Comprehensive metabolic panel - Lipid Panel -  TSH - Vitamin B12 - Vitamin D, 25-hydroxy  7. Follow up She will follow up in 6 months.   Meds ordered this encounter  Medications  . traZODone (DESYREL) 50 MG tablet    Sig: Take 0.5-1 tablets  (25-50 mg total) by mouth at bedtime as needed for sleep.    Dispense:  30 tablet    Refill:  11  . triamcinolone (KENALOG) 0.1 %    Sig: APPLY 1 APPLICATION TOPICALLY 2 TIMES DAILY.    Dispense:  454 g    Refill:  2    Orders Placed This Encounter  Procedures  . CBC with Differential  . Comprehensive metabolic panel  . Lipid Panel  . TSH  . Vitamin B12  . Vitamin D, 25-hydroxy  . Ambulatory referral to Dermatology     Referral Orders     Ambulatory referral to Dermatology   Raliegh Ip, MSN, ANE, FNP-BC [facility] [address] [place], [place] [address] [phone] fax  Problem List Items Addressed This Visit      Cardiovascular and Mediastinum   Hypertension - Primary     Musculoskeletal and Integument   Psoriasis   Relevant Medications   triamcinolone (KENALOG) 0.1 %   Other Relevant Orders   Ambulatory referral to Dermatology    Other Visit Diagnoses    Insomnia, unspecified type       Relevant Medications   traZODone (DESYREL) 50 MG tablet   Anxiety       Relevant Medications   traZODone (DESYREL) 50 MG tablet   Grieving       Healthcare maintenance       Relevant Orders   CBC with Differential   Comprehensive metabolic panel   Lipid Panel   TSH   Vitamin B12   Vitamin D, 25-hydroxy   Follow up          Meds ordered this encounter  Medications  . traZODone (DESYREL) 50 MG tablet    Sig: Take 0.5-1 tablets (25-50 mg total) by mouth at bedtime as needed for sleep.    Dispense:  30 tablet    Refill:  11  . triamcinolone (KENALOG) 0.1 %    Sig: APPLY 1 APPLICATION TOPICALLY 2 TIMES DAILY.    Dispense:  454 g    Refill:  2    Follow-up: No follow-ups on file.    Kallie Locks, FNP

## 2020-08-21 NOTE — Patient Instructions (Signed)
Insomnia Insomnia is a sleep disorder that makes it difficult to fall asleep or stay asleep. Insomnia can cause fatigue, low energy, difficulty concentrating, mood swings, and poor performance at work or school. There are three different ways to classify insomnia:  Difficulty falling asleep.  Difficulty staying asleep.  Waking up too early in the morning. Any type of insomnia can be long-term (chronic) or short-term (acute). Both are common. Short-term insomnia usually lasts for three months or less. Chronic insomnia occurs at least three times a week for longer than three months. What are the causes? Insomnia may be caused by another condition, situation, or substance, such as:  Anxiety.  Certain medicines.  Gastroesophageal reflux disease (GERD) or other gastrointestinal conditions.  Asthma or other breathing conditions.  Restless legs syndrome, sleep apnea, or other sleep disorders.  Chronic pain.  Menopause.  Stroke.  Abuse of alcohol, tobacco, or illegal drugs.  Mental health conditions, such as depression.  Caffeine.  Neurological disorders, such as Alzheimer's disease.  An overactive thyroid (hyperthyroidism). Sometimes, the cause of insomnia may not be known. What increases the risk? Risk factors for insomnia include:  Gender. Women are affected more often than men.  Age. Insomnia is more common as you get older.  Stress.  Lack of exercise.  Irregular work schedule or working night shifts.  Traveling between different time zones.  Certain medical and mental health conditions. What are the signs or symptoms? If you have insomnia, the main symptom is having trouble falling asleep or having trouble staying asleep. This may lead to other symptoms, such as:  Feeling fatigued or having low energy.  Feeling nervous about going to sleep.  Not feeling rested in the morning.  Having trouble concentrating.  Feeling irritable, anxious, or depressed. How  is this diagnosed? This condition may be diagnosed based on:  Your symptoms and medical history. Your health care provider may ask about: ? Your sleep habits. ? Any medical conditions you have. ? Your mental health.  A physical exam. How is this treated? Treatment for insomnia depends on the cause. Treatment may focus on treating an underlying condition that is causing insomnia. Treatment may also include:  Medicines to help you sleep.  Counseling or therapy.  Lifestyle adjustments to help you sleep better. Follow these instructions at home: Eating and drinking  Limit or avoid alcohol, caffeinated beverages, and cigarettes, especially close to bedtime. These can disrupt your sleep.  Do not eat a large meal or eat spicy foods right before bedtime. This can lead to digestive discomfort that can make it hard for you to sleep.   Sleep habits  Keep a sleep diary to help you and your health care provider figure out what could be causing your insomnia. Write down: ? When you sleep. ? When you wake up during the night. ? How well you sleep. ? How rested you feel the next day. ? Any side effects of medicines you are taking. ? What you eat and drink.  Make your bedroom a dark, comfortable place where it is easy to fall asleep. ? Put up shades or blackout curtains to block light from outside. ? Use a white noise machine to block noise. ? Keep the temperature cool.  Limit screen use before bedtime. This includes: ? Watching TV. ? Using your smartphone, tablet, or computer.  Stick to a routine that includes going to bed and waking up at the same times every day and night. This can help you fall asleep faster. Consider   making a quiet activity, such as reading, part of your nighttime routine.  Try to avoid taking naps during the day so that you sleep better at night.  Get out of bed if you are still awake after 15 minutes of trying to sleep. Keep the lights down, but try reading or  doing a quiet activity. When you feel sleepy, go back to bed.   General instructions  Take over-the-counter and prescription medicines only as told by your health care provider.  Exercise regularly, as told by your health care provider. Avoid exercise starting several hours before bedtime.  Use relaxation techniques to manage stress. Ask your health care provider to suggest some techniques that may work well for you. These may include: ? Breathing exercises. ? Routines to release muscle tension. ? Visualizing peaceful scenes.  Make sure that you drive carefully. Avoid driving if you feel very sleepy.  Keep all follow-up visits as told by your health care provider. This is important. Contact a health care provider if:  You are tired throughout the day.  You have trouble in your daily routine due to sleepiness.  You continue to have sleep problems, or your sleep problems get worse. Get help right away if:  You have serious thoughts about hurting yourself or someone else. If you ever feel like you may hurt yourself or others, or have thoughts about taking your own life, get help right away. You can go to your nearest emergency department or call:  Your local emergency services (911 in the [place]  A suicide crisis helpline, such as the National Suicide Prevention Lifeline at 1-800-273-8255. This is open 24 hours a day. Summary  Insomnia is a sleep disorder that makes it difficult to fall asleep or stay asleep.  Insomnia can be long-term (chronic) or short-term (acute).  Treatment for insomnia depends on the cause. Treatment may focus on treating an underlying condition that is causing insomnia.  Keep a sleep diary to help you and your health care provider figure out what could be causing your insomnia. This information is not intended to replace advice given to you by your health care provider. Make sure you discuss any questions you have with your health care provider. Document  Revised: 04/04/2020 Document Reviewed: 04/04/2020 Elsevier Patient Education  2021 [employer] Trazodone Tablets What is this medicine? TRAZODONE (TRAZ oh done) is used to treat depression. This medicine may be used for other purposes; ask your health care provider or pharmacist if you have questions. COMMON BRAND NAME(S): Desyrel What should I tell my health care provider before I take this medicine? They need to know if you have any of these conditions:  attempted suicide or thinking about it  bipolar disorder  bleeding problems  glaucoma  heart disease, or previous heart attack  irregular heart beat  kidney or liver disease  low levels of sodium in the blood  an unusual or allergic reaction to trazodone, other medicines, foods, dyes or preservatives  pregnant or trying to get pregnant  breast-feeding How should I use this medicine? Take this medicine by mouth with a glass of water. Follow the directions on the prescription label. Take this medicine shortly after a meal or a light snack. Take your medicine at regular intervals. Do not take your medicine more often than directed. Do not stop taking this medicine suddenly except upon the advice of your doctor. Stopping this medicine too quickly may cause serious side effects or your condition may worsen. A special MedGuide will   be given to you by the pharmacist with each prescription and refill. Be sure to read this information carefully each time. Talk to your pediatrician regarding the use of this medicine in children. Special care may be needed. Overdosage: If you think you have taken too much of this medicine contact a poison control center or emergency room at once. NOTE: This medicine is only for you. Do not share this medicine with others. What if I miss a dose? If you miss a dose, take it as soon as you can. If it is almost time for your next dose, take only that dose. Do not take double or extra doses. What may  interact with this medicine? Do not take this medicine with any of the following medications:  certain medicines for fungal infections like fluconazole, itraconazole, ketoconazole, posaconazole, voriconazole  cisapride  dronedarone  linezolid  MAOIs like Carbex, Eldepryl, Marplan, Nardil, and Parnate  mesoridazine  methylene blue (injected into a vein)  pimozide  saquinavir  thioridazine This medicine may also interact with the following medications:  alcohol  antiviral medicines for HIV or AIDS  aspirin and aspirin-like medicines  barbiturates like phenobarbital  certain medicines for blood pressure, heart disease, irregular heart beat  certain medicines for depression, anxiety, or psychotic disturbances  certain medicines for migraine headache like almotriptan, eletriptan, frovatriptan, naratriptan, rizatriptan, sumatriptan, zolmitriptan  certain medicines for seizures like carbamazepine and phenytoin  certain medicines for sleep  certain medicines that treat or prevent blood clots like dalteparin, enoxaparin, warfarin  digoxin  fentanyl  lithium  NSAIDS, medicines for pain and inflammation, like ibuprofen or naproxen  other medicines that prolong the QT interval (cause an abnormal heart rhythm) like dofetilide  rasagiline  supplements like St. John's wort, kava kava, valerian  tramadol  tryptophan This list may not describe all possible interactions. Give your health care provider a list of all the medicines, herbs, non-prescription drugs, or dietary supplements you use. Also tell them if you smoke, drink alcohol, or use illegal drugs. Some items may interact with your medicine. What should I watch for while using this medicine? Tell your doctor if your symptoms do not get better or if they get worse. Visit your doctor or health care professional for regular checks on your progress. Because it may take several weeks to see the full effects of this  medicine, it is important to continue your treatment as prescribed by your doctor. Patients and their families should watch out for new or worsening thoughts of suicide or depression. Also watch out for sudden changes in feelings such as feeling anxious, agitated, panicky, irritable, hostile, aggressive, impulsive, severely restless, overly excited and hyperactive, or not being able to sleep. If this happens, especially at the beginning of treatment or after a change in dose, call your health care professional. You may get drowsy or dizzy. Do not drive, use machinery, or do anything that needs mental alertness until you know how this medicine affects you. Do not stand or sit up quickly, especially if you are an older patient. This reduces the risk of dizzy or fainting spells. Alcohol may interfere with the effect of this medicine. Avoid alcoholic drinks. This medicine may cause dry eyes and blurred vision. If you wear contact lenses you may feel some discomfort. Lubricating drops may help. See your eye doctor if the problem does not go away or is severe. Your mouth may get dry. Chewing sugarless gum, sucking hard candy and drinking plenty of water may help. Contact   your doctor if the problem does not go away or is severe. What side effects may I notice from receiving this medicine? Side effects that you should report to your doctor or health care professional as soon as possible:  allergic reactions like skin rash, itching or hives, swelling of the face, lips, or tongue  elevated mood, decreased need for sleep, racing thoughts, impulsive behavior  confusion  fast, irregular heartbeat  feeling faint or lightheaded, falls  feeling agitated, angry, or irritable  loss of balance or coordination  painful or prolonged erections  restlessness, pacing, inability to keep still  suicidal thoughts or other mood changes  tremors  trouble sleeping  seizures  unusual bleeding or bruising Side  effects that usually do not require medical attention (report to your doctor or health care professional if they continue or are bothersome):  change in sex drive or performance  change in appetite or weight  constipation  headache  muscle aches or pains  nausea This list may not describe all possible side effects. Call your doctor for medical advice about side effects. You may report side effects to FDA at 1-800-FDA-1088. Where should I keep my medicine? Keep out of the reach of children. Store at room temperature between 15 and 30 degrees C (59 to 86 degrees F). Protect from light. Keep container tightly closed. Throw away any unused medicine after the expiration date. NOTE: This sheet is a summary. It may not cover all possible information. If you have questions about this medicine, talk to your doctor, pharmacist, or health care provider.  2021 Elsevier/Gold Standard (2020-04-15 14:46:11)  

## 2020-08-22 ENCOUNTER — Encounter (INDEPENDENT_AMBULATORY_CARE_PROVIDER_SITE_OTHER): Payer: Self-pay | Admitting: Family Medicine

## 2020-08-22 ENCOUNTER — Ambulatory Visit (INDEPENDENT_AMBULATORY_CARE_PROVIDER_SITE_OTHER): Payer: BLUE CROSS/BLUE SHIELD | Admitting: Family Medicine

## 2020-08-22 ENCOUNTER — Telehealth: Payer: Self-pay

## 2020-08-22 VITALS — BP 138/88 | HR 86 | Temp 98.1°F | Ht 65.5 in | Wt 196.2 lb

## 2020-08-22 DIAGNOSIS — Z0000 Encounter for general adult medical examination without abnormal findings: Secondary | ICD-10-CM

## 2020-08-22 DIAGNOSIS — E7800 Pure hypercholesterolemia, unspecified: Secondary | ICD-10-CM

## 2020-08-22 DIAGNOSIS — Z01818 Encounter for other preprocedural examination: Secondary | ICD-10-CM

## 2020-08-22 LAB — ECG 12-LEAD
Atrial Rate: 62 {beats}/min
P Axis: 17 degrees
P-R Interval: 142 ms
Q-T Interval: 414 ms
QRS Duration: 80 ms
QTC Calculation (Bezet): 420 ms
R Axis: -4 degrees
T Axis: -2 degrees
Ventricular Rate: 62 {beats}/min

## 2020-08-22 LAB — CBC WITH DIFFERENTIAL/PLATELET
Basophils Absolute: 0.1 10*3/uL (ref 0.0–0.2)
Basos: 1 %
EOS (ABSOLUTE): 0.2 10*3/uL (ref 0.0–0.4)
Eos: 2 %
Hematocrit: 33.2 % — ABNORMAL LOW (ref 34.0–46.6)
Hemoglobin: 11.3 g/dL (ref 11.1–15.9)
Immature Grans (Abs): 0 10*3/uL (ref 0.0–0.1)
Immature Granulocytes: 0 %
Lymphocytes Absolute: 3 10*3/uL (ref 0.7–3.1)
Lymphs: 34 %
MCH: 33.1 pg — ABNORMAL HIGH (ref 26.6–33.0)
MCHC: 34 g/dL (ref 31.5–35.7)
MCV: 97 fL (ref 79–97)
Monocytes Absolute: 1 10*3/uL — ABNORMAL HIGH (ref 0.1–0.9)
Monocytes: 11 %
Neutrophils Absolute: 4.6 10*3/uL (ref 1.4–7.0)
Neutrophils: 52 %
Platelets: 281 10*3/uL (ref 150–450)
RBC: 3.41 x10E6/uL — ABNORMAL LOW (ref 3.77–5.28)
RDW: 11.8 % (ref 11.7–15.4)
WBC: 8.8 10*3/uL (ref 3.4–10.8)

## 2020-08-22 LAB — VITAMIN D 25 HYDROXY (VIT D DEFICIENCY, FRACTURES): Vit D, 25-Hydroxy: 32.4 ng/mL (ref 30.0–100.0)

## 2020-08-22 LAB — COMPREHENSIVE METABOLIC PANEL
ALT: 11 IU/L (ref 0–32)
AST: 17 IU/L (ref 0–40)
Albumin/Globulin Ratio: 1.6 (ref 1.2–2.2)
Albumin: 4.6 g/dL (ref 3.8–4.9)
Alkaline Phosphatase: 91 IU/L (ref 44–121)
BUN/Creatinine Ratio: 13 (ref 9–23)
BUN: 12 mg/dL (ref 6–24)
Bilirubin Total: 0.2 mg/dL (ref 0.0–1.2)
CO2: 18 mmol/L — ABNORMAL LOW (ref 20–29)
Calcium: 10.1 mg/dL (ref 8.7–10.2)
Chloride: 100 mmol/L (ref 96–106)
Creatinine, Ser: 0.9 mg/dL (ref 0.57–1.00)
Globulin, Total: 2.8 g/dL (ref 1.5–4.5)
Glucose: 104 mg/dL — ABNORMAL HIGH (ref 65–99)
Potassium: 4.6 mmol/L (ref 3.5–5.2)
Sodium: 138 mmol/L (ref 134–144)
Total Protein: 7.4 g/dL (ref 6.0–8.5)
eGFR: 74 mL/min/{1.73_m2} (ref >59)

## 2020-08-22 LAB — LIPID PANEL
Chol/HDL Ratio: 2.4 ratio (ref 0.0–4.4)
Cholesterol, Total: 170 mg/dL (ref 100–199)
HDL: 71 mg/dL (ref >39)
LDL Chol Calc (NIH): 83 mg/dL (ref 0–99)
Triglycerides: 88 mg/dL (ref 0–149)
VLDL Cholesterol Cal: 16 mg/dL (ref 5–40)

## 2020-08-22 LAB — TSH: TSH: 1.57 u[IU]/mL (ref 0.450–4.500)

## 2020-08-22 LAB — VITAMIN B12: Vitamin B-12: 260 pg/mL (ref 232–1245)

## 2020-08-22 NOTE — Telephone Encounter (Signed)
Forwarding to [employer]

## 2020-08-22 NOTE — Progress Notes (Signed)
Subjective:      Patient ID: Melinda Brown is a 60 y.o. female     Chief Complaint   Patient presents with    Annual Exam     wwe    Pre-op Exam        HPI     60 year old divorced white female for annual physical exam and preoperative medical clearance at the request of Marybelle Killings, M.D.--scheduled for a variety of cosmetic surgery to be performed 09/18/2020  GYN: [name] Prescription medication: Zolpidem CR 12.5 mg as needed, Phentermine 37.5 mg as needed no known drug allergy No tobacco/daily alcohol Past medical history: Insomnia/restless leg syndrome Past surgical history: Appendectomy, (benign) breast lumpectomy, breast augmentation  Cardiac risk factor: Female greater than 55, BMI greater than 30, hyperlipidemia (LDL) family history: No colon cancer Tdap 09/24/2016, Shingrix (02/16/2019, 04/29/2019), Pfizer (09/04/2019, 09/27/19, 04/09/20) Colonoscopy reportedly negative... Mammography 2021 benign    Review of Systems   Constitutional: Negative.    HENT: Negative.    Eyes: Negative.    Respiratory: Negative.    Cardiovascular: Negative.    Gastrointestinal: Negative.    Endocrine: Negative.    Genitourinary: Negative.    Musculoskeletal: Negative.    Skin: Negative.    Allergic/Immunologic: Negative.    Neurological: Negative.    Hematological: Negative.    Psychiatric/Behavioral: Negative.    All other systems reviewed and are negative.         BP 138/88 (BP Site: Right arm, Patient Position: Sitting, Cuff Size: Medium)    Pulse 86    Temp 98.1 F (36.7 C) (Tympanic)    Ht 1.664 m (5' 5.5")    Wt 89 kg (196 lb 3.2 oz)    SpO2 98%    BMI 32.15 kg/m     Objective:     Physical Exam  Vitals and nursing note reviewed.   Constitutional:       Appearance: Normal appearance. She is normal weight.   HENT:      Head: Normocephalic and atraumatic.      Right Ear: External ear normal.      Left Ear: External ear normal.   Eyes:      Extraocular Movements: Extraocular movements intact.       Conjunctiva/sclera: Conjunctivae normal.      Pupils: Pupils are equal, round, and reactive to light.   Cardiovascular:      Rate and Rhythm: Normal rate and regular rhythm.      Pulses: Normal pulses.      Heart sounds: Normal heart sounds.   Pulmonary:      Effort: Pulmonary effort is normal.      Breath sounds: Normal breath sounds.   Abdominal:      General: Abdomen is flat. Bowel sounds are normal.      Palpations: Abdomen is soft.   Musculoskeletal:         General: Normal range of motion.      Cervical back: Normal range of motion and neck supple.   Skin:     General: Skin is warm and dry.   Neurological:      General: No focal deficit present.      Mental Status: She is alert and oriented to person, place, and time. Mental status is at baseline.   Psychiatric:         Mood and Affect: Mood normal.         Behavior: Behavior normal.  Thought Content: Thought content normal.         Judgment: Judgment normal.          Assessment:     1. Health care maintenance  - Comprehensive metabolic panel  - Lipid panel  - TSH, Abn Reflex to Free T4, Serum    2. Preop examination  - ECG 12 lead Normal  - CBC and differential  - Prothrombin time/INR  - APTT  - Urinalysis  - Beta HCG, Qual, Serum    3. Pure hypercholesterolemia  - Lipid panel        Plan:     See assessment/orders Discussed normal ECG Cleared for surgery fax preoperative medical clearance form, preoperative medical clearance note addendum, ECG, and lab reports to Dr. Judith Part at [phone]... Update colonoscopy minimum every 10 years, annual physical exam    Theresa Mulligan, MD

## 2020-08-22 NOTE — H&P (View-Only) (Signed)
Subjective:      Patient ID: Melinda Brown is a 60 y.o. female     Chief Complaint   Patient presents with   • Annual Exam     wwe   • Pre-op Exam        HPI     60-year-old divorced white female for annual physical exam and preoperative medical clearance at the request of George J. Bitar, M.D.--scheduled for a variety of cosmetic surgery to be performed 09/18/2020 … GYN: Rausch… Prescription medication: Zolpidem CR 12.5 mg as needed, Phentermine 37.5 mg as needed… no known drug allergy… No tobacco/daily alcohol… Past medical history: Insomnia/restless leg syndrome… Past surgical history: Appendectomy, (benign) breast lumpectomy, breast augmentation … Cardiac risk factor: Female greater than 55, BMI greater than 30, hyperlipidemia (LDL)… family history: No colon cancer… Tdap 09/24/2016, Shingrix (02/16/2019, 04/29/2019), Pfizer (09/04/2019, 09/27/19, 04/09/20)… Colonoscopy reportedly negative... Mammography 2021 benign    Review of Systems   Constitutional: Negative.    HENT: Negative.    Eyes: Negative.    Respiratory: Negative.    Cardiovascular: Negative.    Gastrointestinal: Negative.    Endocrine: Negative.    Genitourinary: Negative.    Musculoskeletal: Negative.    Skin: Negative.    Allergic/Immunologic: Negative.    Neurological: Negative.    Hematological: Negative.    Psychiatric/Behavioral: Negative.    All other systems reviewed and are negative.         BP 138/88 (BP Site: Right arm, Patient Position: Sitting, Cuff Size: Medium)    Pulse 86    Temp 98.1 °F (36.7 °C) (Tympanic)    Ht 1.664 m (5' 5.5")    Wt 89 kg (196 lb 3.2 oz)    SpO2 98%    BMI 32.15 kg/m²     Objective:     Physical Exam  Vitals and nursing note reviewed.   Constitutional:       Appearance: Normal appearance. She is normal weight.   HENT:      Head: Normocephalic and atraumatic.      Right Ear: External ear normal.      Left Ear: External ear normal.   Eyes:      Extraocular Movements: Extraocular movements intact.       Conjunctiva/sclera: Conjunctivae normal.      Pupils: Pupils are equal, round, and reactive to light.   Cardiovascular:      Rate and Rhythm: Normal rate and regular rhythm.      Pulses: Normal pulses.      Heart sounds: Normal heart sounds.   Pulmonary:      Effort: Pulmonary effort is normal.      Breath sounds: Normal breath sounds.   Abdominal:      General: Abdomen is flat. Bowel sounds are normal.      Palpations: Abdomen is soft.   Musculoskeletal:         General: Normal range of motion.      Cervical back: Normal range of motion and neck supple.   Skin:     General: Skin is warm and dry.   Neurological:      General: No focal deficit present.      Mental Status: She is alert and oriented to person, place, and time. Mental status is at baseline.   Psychiatric:         Mood and Affect: Mood normal.         Behavior: Behavior normal.           Thought Content: Thought content normal.         Judgment: Judgment normal.          Assessment:     1. Health care maintenance  - Comprehensive metabolic panel  - Lipid panel  - TSH, Abn Reflex to Free T4, Serum    2. Preop examination  - ECG 12 lead Normal  - CBC and differential  - Prothrombin time/INR  - APTT  - Urinalysis  - Beta HCG, Qual, Serum    3. Pure hypercholesterolemia  - Lipid panel        Plan:     See assessment/orders… Discussed normal ECG… Cleared for surgery… fax preoperative medical clearance form, preoperative medical clearance note addendum, ECG, and lab reports to Dr. Bitar at [phone]... Update colonoscopy minimum every 10 years, annual physical exam    Bryn Perkin G Jake Fuhrmann, MD

## 2020-08-22 NOTE — Telephone Encounter (Signed)
Med refill on trazodone 50 mg

## 2020-08-23 ENCOUNTER — Telehealth (INDEPENDENT_AMBULATORY_CARE_PROVIDER_SITE_OTHER): Payer: Self-pay | Admitting: Family Medicine

## 2020-08-23 LAB — CBC AND DIFFERENTIAL
Baso(Absolute): 0 10*3/uL (ref 0.0–0.2)
Basos: 1 %
Eos: 1 %
Eosinophils Absolute: 0.1 10*3/uL (ref 0.0–0.4)
Hematocrit: 41.2 % (ref 34.0–46.6)
Hemoglobin: 14.7 g/dL (ref 11.1–15.9)
Immature Granulocytes Absolute: 0 10*3/uL (ref 0.0–0.1)
Immature Granulocytes: 0 %
Lymphocytes Absolute: 1.8 10*3/uL (ref 0.7–3.1)
Lymphocytes: 34 %
MCH: 31.4 pg (ref 26.6–33.0)
MCHC: 35.7 g/dL (ref 31.5–35.7)
MCV: 88 fL (ref 79–97)
Monocytes Absolute: 0.3 10*3/uL (ref 0.1–0.9)
Monocytes: 7 %
Neutrophils Absolute: 3 10*3/uL (ref 1.4–7.0)
Neutrophils: 57 %
Platelets: 189 10*3/uL (ref 150–450)
RBC: 4.68 x10E6/uL (ref 3.77–5.28)
RDW: 12.4 % (ref 11.7–15.4)
WBC: 5.2 10*3/uL (ref 3.4–10.8)

## 2020-08-23 LAB — REFLEX - MICROSCOPIC EXAMINATION
Casts, UA: NONE SEEN /lpf
Epithelial Cells (non renal): >10 /hpf — AB (ref 0–10)
RBC, UA: NONE SEEN /hpf (ref 0–2)

## 2020-08-23 LAB — COMPREHENSIVE METABOLIC PANEL
ALT: 22 IU/L (ref 0–32)
AST (SGOT): 26 IU/L (ref 0–40)
Albumin/Globulin Ratio: 1.8 (ref 1.2–2.2)
Albumin: 4.7 g/dL (ref 3.8–4.9)
Alkaline Phosphatase: 80 IU/L (ref 44–121)
BUN / Creatinine Ratio: 13 (ref 9–23)
BUN: 14 mg/dL (ref 6–24)
Bilirubin, Total: 0.6 mg/dL (ref 0.0–1.2)
CO2: 18 mmol/L — ABNORMAL LOW (ref 20–29)
Calcium: 9.6 mg/dL (ref 8.7–10.2)
Chloride: 100 mmol/L (ref 96–106)
Creatinine: 1.04 mg/dL — ABNORMAL HIGH (ref 0.57–1.00)
Globulin, Total: 2.6 g/dL (ref 1.5–4.5)
Glucose: 83 mg/dL (ref 65–99)
Potassium: 4 mmol/L (ref 3.5–5.2)
Protein, Total: 7.3 g/dL (ref 6.0–8.5)
Sodium: 138 mmol/L (ref 134–144)
eGFR: 62 mL/min/{1.73_m2} (ref >59)

## 2020-08-23 LAB — URINALYSIS
Bilirubin, UA: NEGATIVE
Blood, UA: NEGATIVE
Glucose, UA: NEGATIVE
Ketones UA: NEGATIVE
Nitrite, UA: NEGATIVE
Protein, UA: NEGATIVE
Urine Specific Gravity: 1.022 (ref 1.005–1.030)
Urobilinogen, Ur: 0.2 mg/dL (ref 0.2–1.0)
pH, UA: 5 (ref 5.0–7.5)

## 2020-08-23 LAB — PT/INR
PT INR: 1 (ref 0.9–1.2)
PT: 10.7 s (ref 9.1–12.0)

## 2020-08-23 LAB — THYROID STIMULATING HORMONE (TSH), REFLEX ON ABNORMAL TO FREE T4, SERUM: TSH: 2.39 u[IU]/mL (ref 0.450–4.500)

## 2020-08-23 LAB — LIPID PANEL
Cholesterol / HDL Ratio: 3.3 ratio (ref 0.0–4.4)
Cholesterol: 218 mg/dL — ABNORMAL HIGH (ref 100–199)
HDL: 67 mg/dL (ref >39)
LDL Chol Calculated (NIH): 130 mg/dL — ABNORMAL HIGH (ref 0–99)
Triglycerides: 122 mg/dL (ref 0–149)
VLDL Calculated: 21 mg/dL (ref 5–40)

## 2020-08-23 LAB — HCG, SERUM, QUALITATIVE: HCG, Beta SubUnit, Qualitative: NEGATIVE m[IU]/mL (ref <6)

## 2020-08-23 LAB — APTT: aPTT: 26 s (ref 24–33)

## 2020-08-23 NOTE — Telephone Encounter (Signed)
Please fax preoperative medical clearance form, preoperative medical clearance note addendum, ECG, and lab reports to Dr. Judith Part at [phone]

## 2020-08-24 NOTE — Telephone Encounter (Signed)
Faxed/put for scanning

## 2020-09-07 ENCOUNTER — Other Ambulatory Visit: Payer: Self-pay

## 2020-09-07 ENCOUNTER — Ambulatory Visit
Admission: RE | Admit: 2020-09-07 | Discharge: 2020-09-07 | Disposition: A | Discharge status: Home / Self Care | Payer: Medicaid Other | Source: Ambulatory Visit | Attending: Neurological Surgery | Admitting: Neurological Surgery

## 2020-09-07 DIAGNOSIS — M4712 Other spondylosis with myelopathy, cervical region: Secondary | ICD-10-CM

## 2020-09-10 NOTE — H&P (Signed)
[facility] OFFICE   [address] [place], [place] [address]           Suzzanne Cloud    Date of Visit:  08/31/2018  Date of Birth: [birth year]  Age: 60 yrs.   Medical Record Number: [id number]  Referring Physician: Christy Sartorius MD, Sherre Lain  __   CURRENT DIAGNOSES     1. Overweight, E66.3  2. Hypercholesterolemia unspecified, E78.00  3. Coronary Atherosclerosis Due To Calcified Coronary Lesion, I25.84  4. 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 clonazepam 0.5 mg tablet, 1 po qhs  2. zolpidem ER 12.5 mg tablet,extended release,multiphase, 1 po qhs  __   CHIEF COMPLAINT/REASON FOR VISIT  cv risks and dilated Ao  __  HISTORY OF PRESENT ILLNESS  Korie  is a 60 year old woman who is referred by Dr. Christy Sartorius for consultation due to concerns about possible dilated aorta. She has no other significant past cardiac history. She does have a family history of dilated thoracic aorta.     She had an echo  in 2018, which suggested only mildly dilated 4.3 cm with normal EF. However, repeat study in March of this year suggested it may be up to 4.8 cm, although the study was technically difficult due to breast implants, and it was felt that it could well be  taken and measured off axis. She subsequently underwent coronary CT angiography and fortunately the dimension by CTA was 4.1 cm. It was 4.1 cm at the sinus of Valsalva and the ascending aorta 4.1 cm, above and below that 3.4 cm.     Marked hyperlipidemia  on labs, total cholesterol 258, triglycerides 92, HDL good at 85, LDL 155. Ten-year estimated cardiovascular risk was fortunately very low at 1.6%.   __  PAST HISTORY      Past Medical Illnesses: hyperlipidemia, hypercholesteremia, depressive disorder, insomnia, OSA, cystitis, contact dermititis, plantar fasciitis;   Past Cardiac Illnesses: No previous history of cardiac disease.; Infectious Diseases: shingles;  Surgical Procedures: colonoscopy, breast surgery,  appendectomy, vascular surgery; Trauma History: No previous  history of significant trauma.; Cardiology Procedures-Invasive: No previous interventional or invasive cardiology procedures.;  Cardiology Procedures-Noninvasive: Echocardiogram August 2018, Echocardiogram March 2020; Left Ventricular Ejection Fraction : LVEF of 60% documented via echocardiogram on 08/12/2018  __  CARDIAC RISK FACTORS      Tobacco Abuse: used to smoke, but quit, only smoked briefly in College; Family History of Heart Disease : positive; Hyperlipidemia: positive; Hypertension : negative;  Diabetes Mellitus: negative; Prior History of Heart Disease : negative; Obesity: positive; Sedentary Life Style :negative; BJY:NWGNFAOZ; Menopausal:biological  menopause  __  SOCIAL HISTORY    Alcohol Use : Does not use alcohol; Smoking: Does not smoke and brriefly in [place]; Former smoker [phone]);  Diet: Regular diet; Exercise: walking and weights 3x per week;   __   REVIEW OF SYSTEMS    General: Recent weight loss;  Integumentary: Denies any change in hair or nails, rashes, or skin lesions.; Eyes: wears eye glasses/contact  lenses, cataracts; Ears, Nose, Throat, Mouth: Denies any hearing loss, epistaxis, hoarseness or difficulty speaking.; Respiratory: sleep apnea; Cardiovascular: Please review HPI;  Abdominal : Denies ulcer disease, hematochezia or melena.;Musculoskeletal:Denies any venous insufficiency,  arthritic symptoms or back problems.; Neurological : Denies any recurrent strokes, TIA, or seizure disorder.;  Psychiatric: anxiety, stress, depression; Endocrine: Denies any weight change, heat/cold intolerance,  polydipsia, or polyuria; Hematologic/Immunologic:  Denies any food allergies, seasonal allergies, bleeding disorders.  __   PHYSICAL EXAMINATION    Vital Signs:  Blood Pressure:  104/70 Sitting, Right arm, regular  cuff  110/76 Sitting, Left arm, regular cuff    Weight: 154.00 lbs.  Height:  66.5"  BMI: 24.48   Pulse: 65/min. Apical         Constitutional: Cooperative, alert and oriented,well developed, well nourished, in no acute distress., overseight Skin:  Warm and dry to touch, no apparent skin lesions, or masses noted. Head: Normocephalic, normal hair pattern, no masses or tenderness  Eyes: conjunctivae and lids normal, Funduscopic exam and visual fields not performed, EOMS intact ENT : Ears, Nose and throat reveal no gross abnormalities. No pallor or cyanosis. Dentition good. Neck: No palpable masses or adenopathy, no thyromegaly,  no JVD, carotid pulses are full and equal bilaterally without bruits. Chest: Normal symmetry, no tenderness to palpation, normal respiratory excursion,  no intercostal retraction, no use of accessory muscles, normal diaphragmatic excursion, clear to auscultation and percussion. Cardiac: Regular rhythm,  S1 normal, S2 normal, No S3 or S4, Apical impulse not displaced, no murmurs, gallops or rubs detected. Abdomen: Abdomen soft, bowel sounds normoactive,  no masses, no hepatosplenomegaly, non-tender, no bruits Peripheral Pulses: The femoral, popliteal, dorsalis pedis, and posterior tibial pulses are full  and equal bilaterally with no bruits auscultated. Extremities/Back: No deformities, clubbing, cyanosis, erythema or edema observed. There are no spinal  abnormalities noted. Normal muscle strength and tone. Psychiatric: anxious Neurological : No gross motor or sensory deficits noted, affect appropriate, oriented to time, person and place.   __    Medications added today by the physician:      ECG:  Sinus rhythm, within normal limits, rate 65.     IMPRESSIONS:   1. Mildly dilated ascending aorta to 4.1 cm by CTA, perhaps 4.3 cm by echo in 2018 with an overestimate probably due to technique of the study and off axis measurement was higher in March of 2020.   2. Hyperlipidemia with high HDL.   3. Family  history of apparent thoracic aneurysm.   4. Low 10-year estimated cardiovascular risk using the ACC/AHA risk estimator  at 1.6%.   5. Potential desire for cosmetic surgery in the next couple of years. This can be readdressed in the future.    6. Obesity with intentional 50-pound weight loss in the last year or so. She continues to lose weight.   7. Some coronary artery calcium noted on CT scan, suggesting some possible coronary atherosclerosis. Her 10-year estimated cardiovascular risk  using the ACC/AHA risk estimator, however, is quite low. She remains asymptomatic. We will continue lifestyle changes.   8. Other medical issues.   a. Possible splenic cyst and pulmonary nodules on CT.  b. History of depression.   c. History  of insomnia.   d. OSA.  e. Bilateral breast implants, limiting echo imaging.     RECOMMENDATIONS:  1. Reassurance.   2. I have reviewed all of the findings of her echos and CT scan. Her ascending aorta is minimally increased.    3. Instructions regarding any lifting, to avoid any straining and to make sure she is breathing appropriately.   4. She was reassured regarding her 10-year cardiovascular risk using the ACC/AHA risk estimator.   5. I did speak with Radiology.  There was some coronary artery calcification in the LAD territory. This would certainly increase her previously extremely low risk.  6. Office followup in one year,  at which point we can consider an ischemic evaluation. In the absence of any symptoms  coupled with the current COVID-related issues, I would not pursue any ischemic risk assessment at this point. At the year followup, we can also consider rechecking the echo to reassess aortic root dimension.   7. Continue lifestyle changes.    8. Follow up and copy of the above note to Dr. Canary Brim.   9. Precautions regarding COVID-19 were discussed, including but not limited to spatial separation (social distancing) and adequate hygiene.     As always, I appreciate the opportunity  to assist in the care of your patients and providing cardiac consultation.     With warmest regards,    Molly Maduro A.  Octavio Graves, MD, [facility]    RAS/tumam    cc: Canary Brim MD  ____________________________  Christianne Dolin   Patient Electronic Access Today  12 Lead ECG Today  Return Visit 15 MIN 1 year

## 2020-09-11 ENCOUNTER — Ambulatory Visit: Payer: BLUE CROSS/BLUE SHIELD

## 2020-09-11 NOTE — PSS Phone Screening (Signed)
Pre-Anesthesia Evaluation    Pre-op phone visit requested by:   Reason for pre-op phone visit: Patient anticipating EYELID LIFT LOWER BILATERAL, EYELID LIFT UPPER WITH BROWPEXY BILATERAL, FACELIFT INCLUDES NECKLIFT, FAT GRAFTING: LIPS AND SMILE LINES (COSMETIC), FACELIFT INCLUDED NECKLIFT (COSMETIC), FAT GRAFTING: LIPS AND SMILE LINES (COSMETIC) procedure.    History of Present Illness/Summary:        Problem List:  Medical Problems             Hospital Problem List  Date Reviewed: 08/22/2020   None           Non-Hospital Problem List  Date Reviewed: 08/22/2020          ICD-10-CM Priority Class Noted    Insomnia G47.00   11/11/2012    Abnormal weight gain R63.5   07/11/2015    Contact dermatitis L25.9   01/05/2020    Coronary atherosclerosis due to calcified coronary lesion I25.10, I25.84   08/31/2018    Cystitis N30.90   09/04/2015    Depressive disorder F32.A   01/27/2010    Encounter for weight loss counseling Z71.3   08/04/2015    Mixed hyperlipidemia E78.2   09/09/2015    Obesity E66.9   07/11/2015    Obstructive sleep apnea syndrome G47.33   01/05/2020    Overweight E66.3   08/31/2018    Plantar fasciitis M72.2   01/05/2020    Pure hypercholesterolemia, unspecified E78.00   08/31/2018    Snoring R06.83   08/23/2015    Serum creatinine raised R79.89   09/04/2015    Thoracic aortic aneurysm, without rupture I71.2   08/26/2018              Medical History   Diagnosis Date    History of varicose veins     Seasonal allergic rhinitis      Past Surgical History:   Procedure Laterality Date    APPENDECTOMY (OPEN)  06/08/2001    AUGMENTATION, BREAST, (MEDICAL)  2011    COLONOSCOPY  07/10/2011    VASCULAR SURGERY  10/06/2000    vericose vein stripping       Current Outpatient Medications:     clonazePAM (KlonoPIN) 0.5 MG tablet, TAKE ONE TABLET BY MOUTH AT BEDTIME AS NEEDED., Disp: 30 tablet, Rfl: 5    zolpidem (AMBIEN CR) 12.5 MG CR tablet, TAKE ONE TABLET BY MOUTH AT BEDTIME AS NEEDED FOR SLEEP, Disp: 30 tablet, Rfl: 5      Medication List          Accurate as of September 11, 2020  1:06 PM. Always use your most recent med list.            clonazePAM 0.5 MG tablet  Commonly known as: KlonoPIN  TAKE ONE TABLET BY MOUTH AT BEDTIME AS NEEDED.  Medication Adjustments for Surgery: Take as needed     zolpidem 12.5 MG CR tablet  Commonly known as: AMBIEN CR  TAKE ONE TABLET BY MOUTH AT BEDTIME AS NEEDED FOR SLEEP  Medication Adjustments for Surgery: Take as needed          No Known Allergies  Family History   Problem Relation Age of Onset    Heart disease Mother     Asthma Daughter         OLDEST     Social History     Occupational History    Not on file   Tobacco Use    Smoking status: Never Smoker    Smokeless tobacco: Never Used  Vaping Use    Vaping Use: Never used   Substance and Sexual Activity    Alcohol use: Yes     Alcohol/week: 6.0 standard drinks     Types: 6 Standard drinks or equivalent per week    Drug use: Never    Sexual activity: Yes     Partners: Male     Birth control/protection: None       Menstrual History:   LMP / Status  Postmenopausal     No LMP recorded (lmp unknown). Patient is postmenopausal.    Tubal Ligation?  No valid surgical or medical questions entered.             Exam Scores:   SDB score Risk Category: No Risk    PONV score      MST score MST Score: 0    Allergy score      Frailty score         Visit Vitals  Ht 1.676 m (5\' 6" )   Wt 86.2 kg (190 lb)   LMP  (LMP Unknown)   BMI 30.67 kg/m       Recent Labs   CBC (last 180 days) 08/22/20  1447   WBC 5.2   Hemoglobin 14.7   Hematocrit 41.2   Platelets 189     Recent Labs   BMP (last 180 days) 08/22/20  1447   Sodium 138   Potassium 4.0   Chloride 100   CO2 18*   BUN 14   Glucose 83     Recent Labs   ABG (last 180 days) 08/22/20  1447   Hemoglobin 14.7     Recent Labs   Coag Panel (last 180 days) 08/22/20  1447   PT 10.7   PT INR 1.0     Recent Labs   Other (last 180 days) 08/22/20  1447   TSH 2.390   ALT 22   AST (SGOT) 26   Protein, Total 7.3

## 2020-09-11 NOTE — Pre-Procedure Instructions (Signed)
Important Instructions Before Your Procedure    Contact your surgeon in the event that your procedure needs to be rescheduled or canceled. If you become ill or have a fever, please call your surgeon before your scheduled procedure. Do not wait until the day of the procedure.    The following instructions are important for your safety. Please follow all instructions carefully.    Follow the FASTING  instructions for procedures requiring anesthesia or sedation as outlined below:    DO NOT EAT OR DRINK ANYTHING AFTER MIDNIGHT THE NIGHT BEFORE YOUR SURGERY                     Unless given specific instructions by your surgeon or pre-procedural provider, please follow the pre-procedural skin preparation instructions below.   Do not shave or use hair removal products for 24 hours prior to surgery.   Do not apply lotion, perfume, cologne, spray-on products or makeup.   Bring a valid photo ID and insurance card, form of payment (check, cash or credit card), and pharmacy card.   Please leave valuables and jewelry at home. Remove all piercings and rings.   Please leave contact lenses at home. If you wear glasses, please bring a case.   Please bring a case marked with your name and date of birth for your hearing aids.   If you have obstructive sleep apnea and will be admitted after your procedure, [facility] will provide a CPAP unit for temporary use. If you are scheduled for an outpatient procedure, you may bring your personal CPAP or BiPAP machine.   Wear loose-fitting and comfortable clothing.   Make arrangements for transportation home. For your safety, you will not be allowed to drive or take public transportation alone after sedation or anesthesia. A responsible adult must accompany you home from the hospital. If you take public transportation or a rideshare service, you must have an adult other than the driver accompany you home. We strongly recommend that you have an adult with you for the first 24 hours after  surgery.   Bring a copy of your advanced directive, living will or power of attorney if available. We encourage you to have an advanced directive. To learn more about advance directives, visit [email or web address].    If the patient is unable to make care-related decisions, please be sure an appropriate adult accompanies the patient and is available to sign consent forms.              Your procedure will take place at [facility]  [facility]. [address], [place]    [address]    Your Day of surgery phone number for Melinda Brown is [phone].  Call that number IF you are going to be LATE checking in for surgery on 09/18/20    If you have questions about  Preparing for surgery you may call the Pre procedure clinic question line  [phone]  Calls are returned Monday=friday    Your case is currently scheduled for 09/18/2020 at 0830 with Bitar, Deno Lunger, MD.  You will need to arrive 0630      You may park in the [facility] on-site parking or use the hospital's valet service - both of these are complimentary for out-patient procedures.    Be sure to bring your photo ID and insurance card with you.

## 2020-09-13 NOTE — Addendum Note (Signed)
Addended by: Clemmie Krill on: 09/13/2020 07:24 AM     Modules accepted: Orders

## 2020-09-17 ENCOUNTER — Encounter: Payer: Self-pay | Admitting: Plastic Surgery

## 2020-09-17 MED ORDER — LIDOCAINE HCL 1 % IJ SOLN
INTRAMUSCULAR | Status: AC
Start: 2020-09-17 — End: ?
  Filled 2020-09-17: qty 60

## 2020-09-17 MED ORDER — LUBRIFRESH P.M. OP OINT
TOPICAL_OINTMENT | OPHTHALMIC | Status: AC
Start: 2020-09-17 — End: ?
  Filled 2020-09-17: qty 3.5

## 2020-09-17 MED ORDER — EPINEPHRINE HCL 1 MG/ML IJ SOLN (WRAP)
Status: AC
Start: 2020-09-17 — End: ?
  Filled 2020-09-17: qty 1

## 2020-09-17 MED ORDER — LIDOCAINE-EPINEPHRINE 1 %-1:100000 IJ SOLN
INTRAMUSCULAR | Status: AC
Start: 2020-09-17 — End: ?
  Filled 2020-09-17: qty 50

## 2020-09-18 ENCOUNTER — Encounter: Payer: Self-pay | Admitting: Plastic Surgery

## 2020-09-18 ENCOUNTER — Ambulatory Visit: Payer: Self-pay | Admitting: Anesthesiology

## 2020-09-18 ENCOUNTER — Encounter
Admission: RE | Disposition: A | Discharge status: Home / Self Care | Payer: Self-pay | Source: Ambulatory Visit | Attending: Plastic Surgery

## 2020-09-18 ENCOUNTER — Ambulatory Visit
Admission: RE | Admit: 2020-09-18 | Discharge: 2020-09-18 | Disposition: A | Discharge status: Home / Self Care | Payer: Self-pay | Source: Ambulatory Visit | Attending: Plastic Surgery | Admitting: Plastic Surgery

## 2020-09-18 DIAGNOSIS — L574 Cutis laxa senilis: Secondary | ICD-10-CM | POA: Insufficient documentation

## 2020-09-18 DIAGNOSIS — Z411 Encounter for cosmetic surgery: Secondary | ICD-10-CM | POA: Insufficient documentation

## 2020-09-18 DIAGNOSIS — E785 Hyperlipidemia, unspecified: Secondary | ICD-10-CM | POA: Insufficient documentation

## 2020-09-18 DIAGNOSIS — G2581 Restless legs syndrome: Secondary | ICD-10-CM | POA: Insufficient documentation

## 2020-09-18 DIAGNOSIS — E65 Localized adiposity: Secondary | ICD-10-CM | POA: Insufficient documentation

## 2020-09-18 DIAGNOSIS — E7800 Pure hypercholesterolemia, unspecified: Secondary | ICD-10-CM | POA: Insufficient documentation

## 2020-09-18 HISTORY — PX: BLEPHAROPLASTY, UPPER & LOWER, (MEDICAL): SHX3286

## 2020-09-18 HISTORY — PX: FACELIFT, (RHYTIDECTOMY) (COSMETIC): SHX4093

## 2020-09-18 HISTORY — PX: DERMAL FAT GRAFT (COSMETIC): SHX3739

## 2020-09-18 SURGERY — BLEPHAROPLASTY, UPPER AND LOWER (MEDICAL)
Anesthesia: Anesthesia General | Site: Mouth | Wound class: Clean

## 2020-09-18 MED ORDER — ESMOLOL HCL 100 MG/10ML IV SOLN
INTRAVENOUS | Status: AC
Start: 2020-09-18 — End: ?
  Filled 2020-09-18: qty 10

## 2020-09-18 MED ORDER — ACETAMINOPHEN 500 MG PO TABS
1000.0000 mg | ORAL_TABLET | Freq: Once | ORAL | Status: AC
Start: 2020-09-18 — End: 2020-09-18
  Administered 2020-09-18: 08:00:00 1000 mg via ORAL

## 2020-09-18 MED ORDER — GABAPENTIN 100 MG PO CAPS
ORAL_CAPSULE | ORAL | Status: AC
Start: 2020-09-18 — End: ?
  Filled 2020-09-18: qty 1

## 2020-09-18 MED ORDER — PROPOFOL 10 MG/ML IV EMUL (WRAP)
INTRAVENOUS | Status: AC
Start: 2020-09-18 — End: ?
  Filled 2020-09-18: qty 100

## 2020-09-18 MED ORDER — PROMETHAZINE HCL 25 MG/ML IJ SOLN
6.2500 mg | Freq: Once | INTRAMUSCULAR | Status: DC | PRN
Start: 2020-09-18 — End: 2020-09-18

## 2020-09-18 MED ORDER — MIDAZOLAM HCL 1 MG/ML IJ SOLN (WRAP)
INTRAMUSCULAR | Status: AC
Start: 2020-09-18 — End: ?
  Filled 2020-09-18: qty 2

## 2020-09-18 MED ORDER — LIDOCAINE HCL 1 % IJ SOLN
1.0000 mL | Freq: Once | INTRAMUSCULAR | Status: DC | PRN
Start: 2020-09-18 — End: 2020-09-18

## 2020-09-18 MED ORDER — FENTANYL CITRATE (PF) 50 MCG/ML IJ SOLN (WRAP)
INTRAMUSCULAR | Status: DC | PRN
Start: 2020-09-18 — End: 2020-09-18
  Administered 2020-09-18: 100 ug via INTRAVENOUS
  Administered 2020-09-18 (×2): 25 ug via INTRAVENOUS

## 2020-09-18 MED ORDER — MAGNESIUM SULFATE 50 % IJ SOLN
INTRAMUSCULAR | Status: DC | PRN
Start: 2020-09-18 — End: 2020-09-18
  Administered 2020-09-18: 2 g via INTRAVENOUS

## 2020-09-18 MED ORDER — OXYCODONE HCL 5 MG PO TABS
5.0000 mg | ORAL_TABLET | Freq: Once | ORAL | Status: AC | PRN
Start: 2020-09-18 — End: 2020-09-18
  Administered 2020-09-18: 5 mg via ORAL

## 2020-09-18 MED ORDER — CEFAZOLIN SODIUM 1 G IJ SOLR
INTRAMUSCULAR | Status: AC
Start: 2020-09-18 — End: ?
  Filled 2020-09-18: qty 2000

## 2020-09-18 MED ORDER — LIDOCAINE-EPINEPHRINE 1 %-1:100000 IJ SOLN
INTRAMUSCULAR | Status: DC | PRN
Start: 2020-09-18 — End: 2020-09-18
  Administered 2020-09-18: 6 mL
  Administered 2020-09-18: 14 mL
  Administered 2020-09-18: .5 mL
  Administered 2020-09-18: 10 mL
  Administered 2020-09-18: 20 mL

## 2020-09-18 MED ORDER — EPHEDRINE SULFATE 50 MG/ML IJ/IV SOLN (WRAP)
Status: AC
Start: 2020-09-18 — End: ?
  Filled 2020-09-18: qty 1

## 2020-09-18 MED ORDER — ROCURONIUM BROMIDE 10 MG/ML IV SOLN (WRAP)
INTRAVENOUS | Status: DC | PRN
Start: 2020-09-18 — End: 2020-09-18
  Administered 2020-09-18: 5 mg via INTRAVENOUS

## 2020-09-18 MED ORDER — CALCIUM CHLORIDE 10 % IV SOLN
INTRAVENOUS | Status: AC
Start: 2020-09-18 — End: ?
  Filled 2020-09-18: qty 10

## 2020-09-18 MED ORDER — AMMONIA AROMATIC IN INHA
1.0000 | Freq: Once | RESPIRATORY_TRACT | Status: DC | PRN
Start: 2020-09-18 — End: 2020-09-18

## 2020-09-18 MED ORDER — LUBRIFRESH P.M. OP OINT
TOPICAL_OINTMENT | OPHTHALMIC | Status: AC
Start: 2020-09-18 — End: ?
  Filled 2020-09-18: qty 7

## 2020-09-18 MED ORDER — SUCCINYLCHOLINE CHLORIDE 20 MG/ML IJ SOLN
INTRAMUSCULAR | Status: DC | PRN
Start: 2020-09-18 — End: 2020-09-18
  Administered 2020-09-18: 140 mg via INTRAVENOUS

## 2020-09-18 MED ORDER — FAMOTIDINE 20 MG/2ML IV SOLN
INTRAVENOUS | Status: AC
Start: 2020-09-18 — End: ?
  Filled 2020-09-18: qty 2

## 2020-09-18 MED ORDER — SCOPOLAMINE 1 MG/3DAYS TD PT72
1.0000 | MEDICATED_PATCH | Freq: Once | TRANSDERMAL | Status: DC
Start: 2020-09-18 — End: 2020-09-18
  Administered 2020-09-18: 08:00:00 1 via TRANSDERMAL

## 2020-09-18 MED ORDER — FAMOTIDINE 10 MG/ML IV SOLN (WRAP)
20.0000 mg | Freq: Once | INTRAVENOUS | Status: AC
Start: 2020-09-18 — End: 2020-09-18
  Administered 2020-09-18: 08:00:00 20 mg via INTRAVENOUS

## 2020-09-18 MED ORDER — ONDANSETRON HCL 4 MG/2ML IJ SOLN
4.0000 mg | Freq: Once | INTRAMUSCULAR | Status: DC | PRN
Start: 2020-09-18 — End: 2020-09-18

## 2020-09-18 MED ORDER — PROPOFOL INFUSION 10 MG/ML
INTRAVENOUS | Status: DC | PRN
Start: 2020-09-18 — End: 2020-09-18
  Administered 2020-09-18: 50 ug/kg/min via INTRAVENOUS

## 2020-09-18 MED ORDER — ESMOLOL HCL 100 MG/10ML IV SOLN
INTRAVENOUS | Status: DC | PRN
Start: 2020-09-18 — End: 2020-09-18
  Administered 2020-09-18 (×2): 10 mg via INTRAVENOUS

## 2020-09-18 MED ORDER — SODIUM CHLORIDE 0.9 % IR SOLN
Status: DC | PRN
Start: 2020-09-18 — End: 2020-09-18
  Administered 2020-09-18: 500 mL

## 2020-09-18 MED ORDER — LIDOCAINE HCL (PF) 2 % IJ SOLN
INTRAMUSCULAR | Status: AC
Start: 2020-09-18 — End: ?
  Filled 2020-09-18: qty 5

## 2020-09-18 MED ORDER — FENTANYL CITRATE (PF) 50 MCG/ML IJ SOLN (WRAP)
INTRAMUSCULAR | Status: AC
Start: 2020-09-18 — End: ?
  Filled 2020-09-18: qty 2

## 2020-09-18 MED ORDER — PHENYLEPHRINE 100 MCG/ML IV BOLUS (ANESTHESIA)
PREFILLED_SYRINGE | INTRAVENOUS | Status: DC | PRN
Start: 2020-09-18 — End: 2020-09-18
  Administered 2020-09-18 (×2): 100 ug via INTRAVENOUS
  Administered 2020-09-18: 400 ug via INTRAVENOUS
  Administered 2020-09-18 (×2): 100 ug via INTRAVENOUS
  Administered 2020-09-18: 200 ug via INTRAVENOUS
  Administered 2020-09-18 (×2): 100 ug via INTRAVENOUS
  Administered 2020-09-18: 300 ug via INTRAVENOUS
  Administered 2020-09-18 (×2): 100 ug via INTRAVENOUS

## 2020-09-18 MED ORDER — PHENYLEPHRINE 100 MCG/ML IV SOSY (WRAP)
PREFILLED_SYRINGE | INTRAVENOUS | Status: AC
Start: 2020-09-18 — End: ?
  Filled 2020-09-18: qty 20

## 2020-09-18 MED ORDER — PROPOFOL 10 MG/ML IV EMUL (WRAP)
INTRAVENOUS | Status: AC
Start: 2020-09-18 — End: ?
  Filled 2020-09-18: qty 40

## 2020-09-18 MED ORDER — LIDOCAINE 4 % EX CREA
TOPICAL_CREAM | Freq: Once | CUTANEOUS | Status: DC | PRN
Start: 2020-09-18 — End: 2020-09-18

## 2020-09-18 MED ORDER — LACTATED RINGERS IV SOLN
50.0000 mL/h | INTRAVENOUS | Status: DC
Start: 2020-09-18 — End: 2020-09-18

## 2020-09-18 MED ORDER — LIDOCAINE-EPINEPHRINE 1 %-1:100000 IJ SOLN
INTRAMUSCULAR | Status: AC
Start: 2020-09-18 — End: ?
  Filled 2020-09-18: qty 50

## 2020-09-18 MED ORDER — BSS IO SOLN
INTRAOCULAR | Status: DC | PRN
Start: 2020-09-18 — End: 2020-09-18
  Administered 2020-09-18: 15 mL

## 2020-09-18 MED ORDER — PROPOFOL 10 MG/ML IV EMUL (WRAP)
INTRAVENOUS | Status: DC | PRN
Start: 2020-09-18 — End: 2020-09-18
  Administered 2020-09-18: 50 mg via INTRAVENOUS
  Administered 2020-09-18: 30 mg via INTRAVENOUS
  Administered 2020-09-18: 300 mg via INTRAVENOUS

## 2020-09-18 MED ORDER — GLYCOPYRROLATE 0.2 MG/ML IJ SOLN (WRAP)
INTRAMUSCULAR | Status: AC
Start: 2020-09-18 — End: ?
  Filled 2020-09-18: qty 1

## 2020-09-18 MED ORDER — LIDOCAINE HCL 2 % IJ SOLN
INTRAMUSCULAR | Status: DC | PRN
Start: 2020-09-18 — End: 2020-09-18
  Administered 2020-09-18: 50 mg

## 2020-09-18 MED ORDER — KETAMINE HCL 50 MG/ML IJ SOLN
INTRAMUSCULAR | Status: DC | PRN
Start: 2020-09-18 — End: 2020-09-18
  Administered 2020-09-18: 30 mg via INTRAVENOUS

## 2020-09-18 MED ORDER — SUCCINYLCHOLINE CHLORIDE 20 MG/ML IJ SOLN
INTRAMUSCULAR | Status: AC
Start: 2020-09-18 — End: ?
  Filled 2020-09-18: qty 10

## 2020-09-18 MED ORDER — ACETAMINOPHEN 500 MG PO TABS
ORAL_TABLET | ORAL | Status: AC
Start: 2020-09-18 — End: ?
  Filled 2020-09-18: qty 2

## 2020-09-18 MED ORDER — STERILE WATER FOR IRRIGATION IR SOLN
Status: DC | PRN
Start: 2020-09-18 — End: 2020-09-18
  Administered 2020-09-18: 250 mL

## 2020-09-18 MED ORDER — HYDROMORPHONE HCL 0.5 MG/0.5 ML IJ SOLN
0.5000 mg | INTRAMUSCULAR | Status: DC | PRN
Start: 2020-09-18 — End: 2020-09-18

## 2020-09-18 MED ORDER — ROCURONIUM BROMIDE 50 MG/5ML IV SOLN
INTRAVENOUS | Status: AC
Start: 2020-09-18 — End: ?
  Filled 2020-09-18: qty 5

## 2020-09-18 MED ORDER — LIDOCAINE HCL URETHRAL/MUCOSAL 2 % EX GEL
CUTANEOUS | Status: AC
Start: 2020-09-18 — End: ?
  Filled 2020-09-18: qty 1

## 2020-09-18 MED ORDER — GABAPENTIN 100 MG PO CAPS
100.0000 mg | ORAL_CAPSULE | Freq: Once | ORAL | Status: AC
Start: 2020-09-18 — End: 2020-09-18
  Administered 2020-09-18: 08:00:00 100 mg via ORAL

## 2020-09-18 MED ORDER — DIPHENHYDRAMINE HCL 50 MG/ML IJ SOLN
INTRAMUSCULAR | Status: DC | PRN
Start: 2020-09-18 — End: 2020-09-18
  Administered 2020-09-18: 12.5 mg via INTRAVENOUS

## 2020-09-18 MED ORDER — OXYCODONE HCL 5 MG PO TABS
ORAL_TABLET | ORAL | Status: AC
Start: 2020-09-18 — End: ?
  Filled 2020-09-18: qty 1

## 2020-09-18 MED ORDER — SCOPOLAMINE 1 MG/3DAYS TD PT72
MEDICATED_PATCH | TRANSDERMAL | Status: AC
Start: 2020-09-18 — End: ?
  Filled 2020-09-18: qty 1

## 2020-09-18 MED ORDER — FENTANYL CITRATE (PF) 50 MCG/ML IJ SOLN (WRAP)
25.0000 ug | INTRAMUSCULAR | Status: DC | PRN
Start: 2020-09-18 — End: 2020-09-18

## 2020-09-18 MED ORDER — DEXAMETHASONE SODIUM PHOSPHATE 4 MG/ML IJ SOLN (WRAP)
INTRAMUSCULAR | Status: DC | PRN
Start: 2020-09-18 — End: 2020-09-18
  Administered 2020-09-18: 8 mg via INTRAVENOUS

## 2020-09-18 MED ORDER — KETAMINE HCL 50 MG/ML IJ SOLN
INTRAMUSCULAR | Status: AC
Start: 2020-09-18 — End: ?
  Filled 2020-09-18: qty 10

## 2020-09-18 MED ORDER — CEFAZOLIN SODIUM 1 G IJ SOLR
INTRAMUSCULAR | Status: DC | PRN
Start: 2020-09-18 — End: 2020-09-18
  Administered 2020-09-18: 2 g via INTRAVENOUS

## 2020-09-18 MED ORDER — DEXAMETHASONE SODIUM PHOSPHATE 4 MG/ML IJ SOLN
INTRAMUSCULAR | Status: AC
Start: 2020-09-18 — End: ?
  Filled 2020-09-18: qty 2

## 2020-09-18 MED ORDER — EPHEDRINE SULFATE 50 MG/ML IJ/IV SOLN (WRAP)
Status: DC | PRN
Start: 2020-09-18 — End: 2020-09-18
  Administered 2020-09-18: 10 mg via INTRAVENOUS
  Administered 2020-09-18 (×2): 5 mg via INTRAVENOUS
  Administered 2020-09-18: 10 mg via INTRAVENOUS

## 2020-09-18 MED ORDER — GLYCOPYRROLATE 0.2 MG/ML IJ SOLN (WRAP)
INTRAMUSCULAR | Status: DC | PRN
Start: 2020-09-18 — End: 2020-09-18
  Administered 2020-09-18: .2 mg via INTRAVENOUS

## 2020-09-18 MED ORDER — LACTATED RINGERS IV SOLN
INTRAVENOUS | Status: DC
Start: 2020-09-18 — End: 2020-09-18
  Administered 2020-09-18: 08:00:00 1000 mL via INTRAVENOUS

## 2020-09-18 MED ORDER — SODIUM CHLORIDE 0.9 % IV SOLN
INTRAVENOUS | Status: DC | PRN
Start: 2020-09-18 — End: 2020-09-18
  Administered 2020-09-18: 09:00:00 100 mL via SUBCUTANEOUS
  Administered 2020-09-18: 09:00:00 300 mL via SUBCUTANEOUS

## 2020-09-18 MED ORDER — MIDAZOLAM HCL 1 MG/ML IJ SOLN (WRAP)
INTRAMUSCULAR | Status: DC | PRN
Start: 2020-09-18 — End: 2020-09-18
  Administered 2020-09-18 (×2): 2 mg via INTRAVENOUS

## 2020-09-18 SURGICAL SUPPLY — 190 items
5% BATADINE STRL OPHT PRE SLTN (Solution) ×4
BANDAGE STERI-STRIP 0.5X4IN (Dressing) ×2
BANDAGE STERI-STRIP 1/4INX4IN (Dressing) ×1
BLADE 15 CLASSIC CARBON STEEL TISSUE (Blade) ×6 IMPLANT
BLADE 15 CLASSIC CARBON STEEL TISSUE SURGICAL (Blade) ×6 IMPLANT
BLADE EXTNDED INSLTD COATED 6' (Blade) ×1
BLADE S/SU RIBBACK CARB STL 15 (Blade) ×2
BLADE SRG CBNSTL 15 BP RB-BCK LF STRL (Blade) ×8
BLANKET LOWER BODY ALLIANCE (Patient Supply) ×1
BLANKET WARMING L60 IN X W36 IN BAIR (Patient Supply) ×6 IMPLANT
BLANKET WRM PLMR BR HGR 60X36IN LF NS FT (Patient Supply) ×4
BULB DRAINAGE LIGHTWEIGHT LOW LEVEL (Drain) ×6 IMPLANT
BULB DRAINAGE LIGHTWEIGHT LOW LEVEL SUCTION RELIAVAC SILICONE 100 CC (Drain) ×6 IMPLANT
BULB DRN SIL 100CC LF STRL LTWT LO LVL (Drain) ×8
CANISTER SCT 1500CC SAFELINER LF NS SMRG (Suction) ×4
CANISTER SUCTION 1500 CC LINE (Suction) ×1
CANISTER SUCTION 1500 CC SEMIRIGID 1 (Suction) ×3 IMPLANT
CANISTER SUCTION 1500 CC SEMIRIGID 1 ELBOW FILTER SELF ALIGN LID (Suction) ×6 IMPLANT
CAP CUROS IV PORT PROTECT TEGO (IV Supply) ×6
CAP DISINFECTION TWIST ON IV PORT CUROS (IV Supply) ×18 IMPLANT
CAP DISINFECTION TWIST ON IV PORT CUROS 70% ISOPROPYL ALCOHOL WHITE (IV Supply) ×18 IMPLANT
CAP DSINF 70% ISPRP CUROS LF TWST ON IV (IV Supply) ×24
CLOSURE STERI-STRIP 1X5IN (Dressing) ×1
COVER FLEXIBLE LIGHT HANDLE PLASTIC GREEN (Procedure Accessories) IMPLANT
COVER FLEXIBLE MEDLINE LIGHT HANDLE (Procedure Accessories) ×3 IMPLANT
COVER LGHT HNDL PLS LF STRL FLXB DISP (Procedure Accessories) ×4
CVR LGHTHNDL FLEX SFT 1PK (Procedure Accessories) ×1
DECANTER 9 BAG (Procedure Accessories) ×1
DECANTER FLD 9IN LTX BG WHT (Procedure Accessories) ×4
DECANTER FLUID L9 IN BAG MEDLINE WHITE (Procedure Accessories) ×3 IMPLANT
DECANTER FLUID L9 IN BAG WHITE (Procedure Accessories) ×3 IMPLANT
DRAIN 4 FREE FLOW CHANNEL RADIOPAQUE (Procedure Accessories) ×6 IMPLANT
DRAIN 4 FREE FLOW CHANNEL RADIOPAQUE BARD L3/8 IN INCISION SILICONE (Procedure Accessories) ×6 IMPLANT
DRAIN BLAKE FLAT 10MM 3/4 FLUT (Procedure Accessories) ×2
DRAIN INCS SIL .75 FLUT FLT BARD 3/8IN (Procedure Accessories) ×8
DRAN EVACUATOR WOUND 100CC (Drain) ×2
DRESSING KERLIX AMD ROLL 4.1YD (Dressing) ×2
DRESSING PETRO 3% BI 3BRM GZE XR 8X1IN (Dressing) ×4
DRESSING PETRO 3% BI 3BRM GZE XR 9X5IN (Dressing) ×8 IMPLANT
DRESSING PETROLATUM XEROFORM L8 IN X W1 (Dressing) ×3 IMPLANT
DRESSING PETROLATUM XEROFORM L8 IN X W1 IN 3% BISMUTH TRIBROMOPHENATE (Dressing) ×6 IMPLANT
DRESSING PETROLATUM XEROFORM L9 IN X W5 IN 3% BISMUTH TRIBROMOPHENATE (Dressing) IMPLANT
DRESSING TEGADERM 2X2 3/4 (Dressing) ×4 IMPLANT
DRESSING WND GZE KRLX 4.125YDX4.5IN LF (Dressing) ×8
DRESSING WOUND KERLIX L4.125 YD X W4.5 (Dressing) ×6 IMPLANT
DRESSING WOUND KERLIX L4.125 YD X W4.5 IN 6 PLY ANTIMICROBIAL (Dressing) IMPLANT
DRESSING XEROFORM 1X8 (Dressing) ×1
DRESSING XEROFORM 5X9IN (Dressing) ×2
ELECTRODE ADULT PATIENT RETURN L9 FT REM POLYHESIVE ACRYLIC FOAM (Procedure Accessories) ×6 IMPLANT
ELECTRODE ELECTROSRGCL BLADE L6.5 IN OD3/32IN EDGE L.2IN STD SHFT XTND (Blade) ×3 IMPLANT
ELECTRODE ELECTROSURGICAL BLADE L6.5 IN (Blade) ×3 IMPLANT
ELECTRODE ELECTROSURGICAL MICRO NEEDLE (Needles) ×3 IMPLANT
ELECTRODE ELECTROSURGICAL MICRO NEEDLE L3 CM DEROYAL TUNGSTEN ULTRA (Needles) ×9 IMPLANT
ELECTRODE ESURG BLDE EDG 3/32IN 6.5IN LF (Blade) ×4
ELECTRODE ESURG TUNG MIC NDL 3CM LF UL (Needles) ×4
ELECTRODE PATIENT RETURN L9 FT VALLEYLAB (Procedure Accessories) ×3 IMPLANT
ELECTRODE PT RTN RM PHSV ACRL FM C30- LB (Procedure Accessories) ×4
GLOVE SRG PLISPRN 8.5 BGL PI MIC 301MM (Glove) ×16
GLOVE SURGICAL 8.5 BIOGEL PI MICRO (Glove) ×12 IMPLANT
GLOVE SURGICAL 8.5 BIOGEL PI MICRO POWDER FREE ANTISLIP MICRO ROUGHEN (Glove) ×6 IMPLANT
GLOVES BIOGEL PI MICRO SZ 8.5 (Glove) ×4
GOWN PREVNTN+ LARGE STRL (Gown) ×3
GOWN PREVNTN+ XLARGE STRL (Gown) ×2
GOWN SRG L ORBS LF STRL AAMI LVL 4 (Gown) ×12
GOWN SRG XL ORBS LF STRL AAMI LVL 4 (Gown) ×8
GOWN SURGICAL L BLUE AAMI LEVEL 4 BREATHABLE CSR WRAP PROTECTION (Gown) ×3 IMPLANT
GOWN SURGICAL L MEDLINE BLUE AAMI LEVEL (Gown) ×9 IMPLANT
GOWN SURGICAL XL BLUE AAMI LEVEL 4 BREATHABLE CSR WRAP PROTECTION (Gown) IMPLANT
GOWN SURGICAL XL MEDLINE BLUE AAMI LEVEL (Gown) ×6 IMPLANT
MRKR SKN W RULER AND LABELS (Positioning Supplies) ×4
NEEDLE HPO SS PP THNWL BD 19GA 1.5IN LF (Needles) ×8 IMPLANT
NEEDLE MICRO FIRE 3CM (Needles) ×1
NEEDLE REG BEVEL 19GX1.5IN (Needles) ×2
NEEDLE SPINAL BD OD18 GA L3 1/2 IN (Needles) ×6 IMPLANT
NEEDLE SPINAL DISP 18GX3.5IN (Needles) ×2
NEEDLE SPINAL L3 1/2 IN REGULAR WALL QUINCKE TIP OD18 GA BD (Needles) ×6 IMPLANT
NEEDLE SPNL PP RW BD QNCK 18GA 3.5IN LF (Needles) ×8
PAD ABD DERMACEA 9X5IN LF STRL 4 SL EDG (Dressing) ×24
PAD ABDOMINAL L9 IN X W5 IN 4 SEAL EDGE (Dressing) IMPLANT
PAD ARMBOARD FOAM 20X8X2IN (Positioning Supplies) ×5
PAD ARMBOARD L20 IN X W8 IN X H2 IN (Positioning Supplies) ×3 IMPLANT
PAD ARMBOARD L20 IN X W8 IN X H2 IN CONVOLUTE FOAM PURPLE (Positioning Supplies) ×3 IMPLANT
PAD CURITY ABDOMINAL 5X9IN (Dressing) ×6
PAD ELECTROSRG GRND REM W CRD (Procedure Accessories) ×1
PAD PREP CUFF 24X41IN W 9IN (Prep) ×1
PAD SRGPRP 44X24IN NS CUF 9IN (Prep) ×10 IMPLANT
PEN SRGMRK 6IN LF STRL RLR LG RSRV REG (Positioning Supplies) ×16
PEN SURGICAL MARKING MEDLINE SKIN RULER (Positioning Supplies) ×12 IMPLANT
PEN SURGICAL MARKING SKIN RULER LARGE RESERVOIR REGULAR TIP LABEL (Positioning Supplies) ×9 IMPLANT
PIN SAFETY 2IN (Procedure Accessories) ×5 IMPLANT
PIN SAFETY L2 IN LARGE GROUPING HOLDING RETAINING (Procedure Accessories) IMPLANT
POSITIONER HEAD DONUT 9IN (Positioning Supplies) ×5 IMPLANT
POSITIONER HEAD FOAM POSITIONING DONUT OD9 IN (Positioning Supplies) ×3 IMPLANT
PROTECTOR CORNEAL (Procedure Accessories) ×2
PROTECTOR EYE CROUCH 25.8X13.5MM LF STRL (Procedure Accessories) ×8
PROTECTOR EYE L25.8 MM X W13.5 MM (Procedure Accessories) ×6 IMPLANT
PROTECTOR EYE L25.8 MM X W13.5 MM REMOVABLE CROUCH ADULT CORNEAL (Procedure Accessories) ×6 IMPLANT
SLEEVE CMPR MED KN LGTH KDL SCD 21- IN (Sleeve) ×4 IMPLANT
SLEEVE COMPRESSION MEDIUM KNEE LENGTH KENDALL SEQUENTIAL OD21- IN (Sleeve) ×6 IMPLANT
SLEEVE SEQ COMP KNEE REGULAR (Sleeve) ×1
SLEEVE SRG PLSTR CLU 3-5.5IN 21.5IN LF (Drape) ×8
SLEEVE SURGICAL L21.5 IN ELASTIC END (Drape) ×6 IMPLANT
SLEEVE SURGICAL L21.5 IN ELASTIC END NONWOVEN OD3-5.5 IN CONVERTORS (Drape) IMPLANT
SOLUTION OPHTHALMIC PREP HDPE BOTTLE (Solution) ×12 IMPLANT
SOLUTION OPHTHALMIC PREP HDPE BOTTLE BETADINE 5% PVP IODINE 30 ML (Solution) ×6 IMPLANT
SOLUTION OPTH 5% PVP IOD 30ML BDINE STRL (Solution) ×16
SPONGE LAP CTTN 18X18IN LF STRL 4 PLY (Sponge) ×12
SPONGE LAPAROTOMY 18X18IN (Sponge) ×3
SPONGE LAPAROTOMY L18 IN X W18 IN 4 PLY (Sponge) ×9 IMPLANT
SPONGE LAPAROTOMY L18 IN X W18 IN 4 PLY RADIOPAQUE PYRONEMA FREE HIGH (Sponge) ×3 IMPLANT
SPONGE SRG VISTEC 4X4IN LF STRL 16 PLY (Sponge) ×8
SPONGE SURGICAL L4 IN X W4 IN 16 PLY (Sponge) ×3 IMPLANT
SPONGE VISTEC 16PLY 4X4IN (Sponge) ×2
STAPLER APPOSE ULC 35 W SKIN (Staplers) ×3
STAPLER SKIN L4.1 MM X W6.5 MM 35 WIDE (Staplers) ×9 IMPLANT
STAPLER SKIN L4.1 MM X W6.5 MM 35 WIDE STAPLE CARTRIDGE APPOSE ULC (Staplers) ×6 IMPLANT
STAPLER SKN SS PLS APS U 4.1X6.5MM LF 35 (Staplers) ×12
STERILE SLEEVE (Drape) ×2
STRAP STRCHR SONTARA PLSTR 60X3IN LF NS (Procedure Accessories) ×8 IMPLANT
STRAP STRETCHER L60 IN X W3 IN OR TABLE HOOK SONTARA POLYESTER WHITE (Procedure Accessories) ×6 IMPLANT
STRAP STRETCHER/TABLE 3X60IN (Procedure Accessories) ×2
STRIP SKIN CLOSURE L4 IN X W1/2 IN (Dressing) ×6 IMPLANT
STRIP SKIN CLOSURE L4 IN X W1/2 IN REINFORCE STERI-STRIP POLYESTER (Dressing) ×3 IMPLANT
STRIP SKIN CLOSURE L4 IN X W1/4 IN (Dressing) ×3 IMPLANT
STRIP SKIN CLOSURE L4 IN X W1/4 IN REINFORCE STERI-STRIP POLYESTER (Dressing) IMPLANT
STRIP SKIN CLOSURE L5 IN X W1 IN (Dressing) ×3 IMPLANT
STRIP SKIN CLOSURE L5 IN X W1 IN REINFORCE STERI-STRIP POLYESTER WHITE (Dressing) IMPLANT
STRIP SKNCLS PLSTR STRSTRP 4X.25IN LF (Dressing) ×4
STRIP SKNCLS PLSTR STRSTRP 4X.5IN LF (Dressing) ×8
STRIP SKNCLS PLSTR STRSTRP 5X1IN LF STRL (Dressing) ×4
SUT ETHILON 4-0 PS2 18IN (Suture) ×2
SUT VICRYL 5-0 P3 18IN (Suture) ×2
SUTURE ABS 3-0 PS2 MNCRL MTPS 27IN MFL (Suture) ×24
SUTURE ABS 5-0 P-3 COAT VCL 18IN BRD UD (Suture) ×8
SUTURE ABS CR 5-0 P-3 MTPS 18IN MFL BRN (Suture) ×8
SUTURE CHROMIC 5-0 P3 18IN (Suture) ×2
SUTURE CHROMIC GUT CHROMIC 5-0 P-3 L18 (Suture) ×6 IMPLANT
SUTURE COATED VICRYL 5-0 P-3 L18 IN (Suture) ×6 IMPLANT
SUTURE COATED VICRYL 5-0 P-3 L18 IN BRAID UNDYED ABSORBABLE (Suture) IMPLANT
SUTURE ETHILON 3-0 PS1 18IN (Suture) ×2
SUTURE ETHILON BLACK 3-0 PS-1 L18 IN (Suture) ×6 IMPLANT
SUTURE ETHILON BLACK 3-0 PS-1 L18 IN MONOFILAMENT NONABSORBABLE (Suture) IMPLANT
SUTURE MONOCRYL 3-0 PS-2 L27 IN (Suture) ×18 IMPLANT
SUTURE MONOCRYL 3-0 PS-2 L27 IN MONOFILAMENT UNDYED ABSORBABLE (Suture) ×18 IMPLANT
SUTURE MONOCRYL 3-0 PS2 27IN (Suture) ×6
SUTURE NABSB 3-0 PS1 ETH MTPS 18IN MFL (Suture) ×8
SUTURE NABSB 3-0 SH PRLN 30IN MFL BLU (Suture) ×12
SUTURE NABSB 4-0 PS2 ETH MTPS 18IN MFL (Suture) ×2 IMPLANT
SUTURE NABSB 5-0 PS2 PRLN MTPS 18IN MFL (Suture) ×32
SUTURE NABSB SLK 0 SH PRMHND 30IN BRD (Suture) ×4
SUTURE NABSB SLK 2-0 SH PRMHND 30IN BRD (Suture) ×4
SUTURE PERMAHAND 0 SH 30IN (Suture) ×1
SUTURE PRO 5-0 PS2 18IN (Suture) ×8
SUTURE PROLENE 3-0 SH 30IN (Suture) ×3
SUTURE PROLENE BLUE 3-0 SH L30 IN (Suture) ×9 IMPLANT
SUTURE PROLENE BLUE 3-0 SH L30 IN MONOFILAMENT NONABSORBABLE (Suture) IMPLANT
SUTURE PROLENE BLUE 5-0 PS-2 L18 IN (Suture) ×24 IMPLANT
SUTURE PROLENE BLUE 5-0 PS-2 L18 IN MONOFILAMENT NONABSORBABLE (Suture) ×30 IMPLANT
SUTURE SILK 2-0 SH 30IN (Suture) ×1
SUTURE SILK PERMA HAND BLACK 0 SH L30 IN (Suture) ×3 IMPLANT
SUTURE SILK PERMA HAND BLACK 0 SH L30 IN BRAID NONABSORBABLE (Suture) IMPLANT
SUTURE SILK PERMA HAND BLACK 2-0 SH L30 (Suture) ×3 IMPLANT
SUTURE SILK PERMA HAND BLACK 2-0 SH L30 IN BRAID NONABSORBABLE (Suture) IMPLANT
SYRINGE 1 ML GRADUATE LOK MDCL (Syringes, Needles) ×15 IMPLANT
SYRINGE 1 ML GRADUATE LOK MEDICAL (Syringes, Needles) ×15
SYRINGE 10 ML GRADUATE NONPYROGENIC DEHP (Syringes, Needles) ×45 IMPLANT
SYRINGE 10 ML GRADUATE NONPYROGENIC DEHP FREE PVC FREE LOK MEDICAL (Syringes, Needles) ×15 IMPLANT
SYRINGE 1CC LL DISP (Syringes, Needles) ×5
SYRINGE 50 ML GRADUATE NONPYROGENIC DEHP (Syringes, Needles) ×12 IMPLANT
SYRINGE 50 ML GRADUATE NONPYROGENIC DEHP FREE PVC FREE LOK MEDICAL (Syringes, Needles) ×30 IMPLANT
SYRINGE LUER LOCK 10CC (Syringes, Needles) ×15
SYRINGE LUER LOK 50ML (Syringes, Needles) ×4
SYRINGE MED 10ML LL LF STRL GRAD N-PYRG (Syringes, Needles) ×60
SYRINGE MED 50ML LL LF STRL GRAD N-PYRG (Syringes, Needles) ×16
SYRINGE MED LL LF STRL GRAD DISP (Syringes, Needles) ×20
TOWEL OR CTTN STD STRG 27X17IN LF STRL (Procedure Accessories) ×20
TOWEL OR L27 IN X W17 IN STANDARD (Procedure Accessories) ×15 IMPLANT
TOWEL OR L27 IN X W17 IN STD STRGHT PREWASH DELINT MEDLN COTTON BLUE (Procedure Accessories) ×9 IMPLANT
TOWEL STANDARD 4PK 20PKCS BLUE (Procedure Accessories) ×5
TRAY DRY SKIN SCRUB (Prep) ×2
TRAY PLASTIC SX WOODBURN (Pack) ×1
TRAY PREOPERATIVE SKIN DRY SCRUB (Tray) ×10 IMPLANT
TRAY SKIN SCRUB L8 IN 6 WING 6 SPONGE STICK 2 TIP APPLICATOR DRY VINYL (Prep) ×3 IMPLANT
TRAY SKIN SCRUB MEDLINE L8 IN VINYL (Prep) ×6 IMPLANT
TRAY SKN SCRB VNYL CTTN 8IN LF STRL 6 (Prep) ×8
TRAY SRG PLASTIC SX WOODBURN (Pack) ×4 IMPLANT
TRAY SURGICAL PLASTICS WOODBURN (Pack) ×6 IMPLANT
TUBING ASPIRATION 3/8X12 (Tubing) ×1
TUBING SCT PSITC 12FT LF STRL DISP (Tubing) ×4
TUBING SUCTION L12 FT PSI-TEC (Tubing) ×3 IMPLANT

## 2020-09-18 NOTE — Transfer of Care (Signed)
Anesthesia Transfer of Care Note    Patient: Melinda Brown    Procedures performed: Procedure(s) with comments:  EYELID LIFT LOWER BILATERAL, EYELID LIFT UPPER WITH BROWPEXY BILATERAL, FACELIFT INCLUDES NECKLIFT, FAT GRAFTING: LIPS AND SMILE LINES (COSMETIC) - Eye Lid   FACELIFT INCLUDED NECKLIFT (COSMETIC)  FAT GRAFTING: LIPS AND SMILE LINES (COSMETIC) FAT HARVESTED FROM ABDOMINE - Lip    Anesthesia type: General ETT    Patient location:Phase I PACU    Last vitals:   Vitals:    09/18/20 0754   BP: 128/80   Pulse: 72   Resp: 16   Temp: 36.3 C (97.3 F)   SpO2: 98%       Post pain: Patient not complaining of pain, continue current therapy      Mental Status:awake and lethargic    Respiratory Function: tolerating face mask    Cardiovascular: stable    Nausea/Vomiting: patient not complaining of nausea or vomiting    Hydration Status: adequate    Post assessment: no apparent anesthetic complications, no reportable events and no evidence of recall    Signed by: Denice Bors, CRNA  09/18/20 2:03 PM

## 2020-09-18 NOTE — Brief Op Note (Signed)
BRIEF OP NOTE    Date Time: 09/18/20 1:44 PM    Patient Name:   Melinda Brown    Date of Operation:   09/18/2020    Providers Performing:   Surgeon(s):  Koraline Phillipson, Deno Lunger, MD    Assistant (s):   Circulator: Enid Baas, RN  Relief Circulator: Nevin Bloodgood, RN  Scrub Person: Janece Canterbury  Second Circulator: Eyvonne Left, RN    Operative Procedure:   Procedure(s):  EYELID LIFT LOWER BILATERAL, EYELID LIFT UPPER WITH BROWPEXY BILATERAL, FACELIFT INCLUDES NECKLIFT, FAT GRAFTING: LIPS AND SMILE LINES (COSMETIC)  FACELIFT INCLUDED NECKLIFT (COSMETIC)  FAT GRAFTING: LIPS AND SMILE LINES (COSMETIC) FAT HARVESTED FROM ABDOMINE     1. Upper and lower eyelid lifts and browpexies bilat. And frost sutures bilat.   2. Facelift with Neck liposuction 50 cc, SSNL and partial SMASectomy,   3. Fat grafting to face  : cheeks L=R= 7 cc each , NL folds L=R= 3 cc each, lips upper = lower = 6 cc total , chin = 5 cc    Preoperative Diagnosis:   Facial aging    Postoperative Diagnosis:   Facial aging    Anesthesia:   General    Estimated Blood Loss:   100 cc  IVF= 3300 cc  U.o.= 150 cc  Tumescence= 400 cc    Implants:   * No implants in log *    Drains:   Drains: Yes, Drain #2: Jackson-Pratt Flat 10 MM    Specimens:     ID Type Source Tests Collected by Time Destination   A :  No Specimen Required No Specimen NO PATHOLOGY Marybelle Killings, MD 09/18/2020 1213          Findings:       Complications:   None       Signed by: Marybelle Killings, MD                                                                           [facility] OR          WNL

## 2020-09-18 NOTE — Anesthesia Preprocedure Evaluation (Signed)
Anesthesia Evaluation    AIRWAY    Mallampati: II    TM distance: >3 FB  Neck ROM: full  Mouth Opening:full   CARDIOVASCULAR    cardiovascular exam normal, regular and normal       DENTAL    no notable dental hx     PULMONARY    pulmonary exam normal and clear to auscultation     OTHER FINDINGS                  Relevant Problems   ANESTHESIA   (+) Obstructive sleep apnea syndrome      PULMONARY   (+) Obstructive sleep apnea syndrome      CARDIO   (+) Coronary atherosclerosis due to calcified coronary lesion   (+) Thoracic aortic aneurysm, without rupture               Anesthesia Plan    ASA 2     general                     intravenous induction   Detailed anesthesia plan: general endotracheal  Monitors/Adjuncts: other    Post Op: other plan for postoperative opioid use    Post op pain management: per surgeon    informed consent obtained    Plan discussed with CRNA.            ===============================================================  Inpatient Anesthesia Evaluation    Patient Name: Melinda Brown,Melinda Brown  Surgeon: Marybelle Killings, MD  Patient Age / Sex: 60 y.o. / female    Medical History:     Past Medical History:   Diagnosis Date    History of varicose veins     Seasonal allergic rhinitis        Past Surgical History:   Procedure Laterality Date    APPENDECTOMY (OPEN)  06/08/2001    AUGMENTATION, BREAST, (MEDICAL)  2011    COLONOSCOPY  07/10/2011    VASCULAR SURGERY  10/06/2000    vericose vein stripping         Allergies:   No Known Allergies      Medications:     Current Facility-Administered Medications   Medication Dose Route Frequency Last Rate Last Admin    acetaminophen (TYLENOL) tablet 1,000 mg  1,000 mg Oral Once        famotidine (PEPCID) injection 20 mg  20 mg Intravenous Once        gabapentin (NEURONTIN) capsule 100 mg  100 mg Oral Once        lactated ringers infusion   Intravenous Continuous        lidocaine (LMX) 4 % cream   Topical Once PRN        lidocaine (XYLOCAINE) 1 % injection  1 mL  1 mL Intradermal Once PRN        scopolamine (TRANSDERM-SCOP) patch (1.5 mg) 1 mg/72 hrs 1 patch  1 patch Transdermal Once                  Prior to Admission medications    Medication Sig Start Date End Date Taking? Authorizing Provider   clonazePAM (KlonoPIN) 0.5 MG tablet TAKE ONE TABLET BY MOUTH AT BEDTIME AS NEEDED. 01/05/20   Plescia, Sherre Lain, MD   zolpidem (AMBIEN CR) 12.5 MG CR tablet TAKE ONE TABLET BY MOUTH AT BEDTIME AS NEEDED FOR SLEEP 07/05/20   Plescia, Sherre Lain, MD     Vitals        Wt  Readings from Last 3 Encounters:   09/11/20 86.2 kg (190 lb)   08/22/20 89 kg (196 lb 3.2 oz)   02/20/20 89.8 kg (198 lb)     BMI (Estimated body mass index is 30.67 kg/m as calculated from the following:    Height as of 09/11/20: 1.676 m (5\' 6" ).    Weight as of 09/11/20: 86.2 kg (190 lb).)  Temp Readings from Last 3 Encounters:   08/22/20 36.7 C (98.1 F) (Tympanic)   02/20/20 36.4 C (97.5 F) (Tympanic)   01/05/20 36.3 C (97.4 F) (Tympanic)     BP Readings from Last 3 Encounters:   08/22/20 138/88   02/20/20 110/80   01/05/20 122/84     Pulse Readings from Last 3 Encounters:   08/22/20 86   02/20/20 77   01/05/20 92           Labs:   CBC:  Lab Results   Component Value Date    WBC 5.2 08/22/2020    HGB 14.7 08/22/2020    HCT 41.2 08/22/2020    PLT 189 08/22/2020       Chemistries:  Lab Results   Component Value Date    NA 138 08/22/2020    K 4.0 08/22/2020    CL 100 08/22/2020    CO2 18 (L) 08/22/2020    BUN 14 08/22/2020    CREAT 1.04 (H) 08/22/2020    GLU 83 08/22/2020    CA 9.6 08/22/2020    AST 26 08/22/2020       Coags:  Lab Results   Component Value Date    PT 10.7 08/22/2020    INR 1.0 08/22/2020     _____________________      Signed by: Florestine Avers, MD  09/18/20   7:51 AM    =============================================================           Signed by: Florestine Avers, MD 09/18/20 7:50 AM

## 2020-09-18 NOTE — Anesthesia Postprocedure Evaluation (Signed)
Anesthesia Post Evaluation    Patient: Melinda Brown    Procedure(s) with comments:  EYELID LIFT LOWER BILATERAL, EYELID LIFT UPPER WITH BROWPEXY BILATERAL, FACELIFT INCLUDES NECKLIFT, FAT GRAFTING: LIPS AND SMILE LINES (COSMETIC) - Eye Lid   FACELIFT INCLUDED NECKLIFT (COSMETIC)  FAT GRAFTING: LIPS AND SMILE LINES (COSMETIC) FAT HARVESTED FROM ABDOMINE - Lip    Anesthesia type: general    Last Vitals:   Vitals Value Taken Time   BP 112/71 09/18/20 1510   Temp 36.6 C (97.8 F) 09/18/20 1403   Pulse 91 09/18/20 1510   Resp 20 09/18/20 1510   SpO2 96 % 09/18/20 1510                 Anesthesia Post Evaluation:     Patient Evaluated: PACU  Patient Participation: complete - patient participated  Level of Consciousness: awake and alert  Pain Score: 0  Pain Management: adequate  Multimodal analgesia pain management approach    Airway Patency: patent    Anesthetic complications: No      PONV Status: none    Cardiovascular status: acceptable  Respiratory status: acceptable  Hydration status: acceptable        Signed by: Rebecca Eaton, MD, 09/18/2020 3:26 PM

## 2020-09-18 NOTE — Discharge Instr - AVS First Page (Addendum)
POST-SURGICAL INSTRUCTIONS  FACELIFT  Given for nausea prevention:  Scopolamine (TRANSDERM-SCOP) patch (1.5 mg) 1 mg/72 hrs 1 patch placed behind    LEFT   arm at    08:12 am                .  Famotidine (PEPCID) injection 20mg  given at  08:12 am               .  Ondansetron (ZOFRAN) injection 4mg  given at                     .    Given for pain management:  Acetaminophen (TYLENOL) tablet 1,000 mg given at  08:12 am                .  Gabapentin (NEURONTIN) capsule 100 mg given at   08:12 am                 .  OxyCODONE (ROXICODONE) immediate release tablet 5 mg given at                  .    Antibotics    Ancef 2 gram injection given at__08:41 am_____________.     If you have excessive drainage, bleeding or pain, sudden increased swelling at the surgical site, chest pain, shortness of breath or lower extremity swelling/pain, sudden visual loss, call our office at [phone]. After office hours, you may reach Dr. Judith Part on his cell phone at [phone]. If unable to reach Dr. Judith Part, go to the nearest Emergency Department. If considered non-life threatening, Dr. Judith Part would prefer you go to [facility] Emergency Department as he has hospital privileges there.    Do not smoke or be exposed to second-hand smoke for two weeks before and after surgery.  Smoking reduces capillary flow in your skin causing necrosis, or death, of tissues and permanent visible scarring.    Do not drink alcohol one week before and after surgery because it dilates the blood vessels and could increase postoperative bleeding.  Do not drink alcohol until you have stopped taking the prescription pain pills as the combination can be dangerous.     DO NOT TAKE ASPIRIN, EXCEDRIN, IBUPROFEN (MOTRIN, ALEVE), FISH OIL, VITAMIN E, GINGKO BILOBA or [facility] WART for at least two weeks before and after the operation.    No flying or prolonged travel (over 1 hour of travel time regardless of method of transportation) for at least two weeks before  and after the procedure.    You need to eat a high protein diet, 80-100g a day for your body to heal well and quickly. Protein shakes may be necessary to fulfill this requirement.    Medications:   Pain:  For discomfort, take pain medication as directed.    If you are no longer taking the narcotic, two Tylenol (Acetaminophen) 500mg  tablets every 3-4 hours help most discomfort. Take no more than 8 tablets per day.   Narcotic pain medication is constipating; therefore, take Colace or fiber supplements such as Metamucil or Senekot regularly. If you are prone to constipation you may want to increase your regular dose to prevent constipation from occurring. This can be purchased over the counter.  Note: Narcotic pain medication can only be given as an electronic prescription.  Call the office at [phone] if you have severe pain that is not responding to pain medication.   Caution: Pain medication can make you dizzy and lightheaded. For  your safety, please have a caretaker or help nearby when using the restroom or taking a shower.  Nausea:   Take pain meds with food to prevent nausea. A couple of crackers aren't enough.    Take medication 30 minutes prior to the narcotic to prevent nausea from occurring every 4-6 hours.   If prescribed an anti-nausea patch, please remove 24 hours after your surgery.  Antibiotic:  Take antibiotics until prescription is finished to avoid infections.    Anti-anxiety:  Mainly used prior to surgery. Do not take anti-anxiety medication after your surgery while taking narcotic pain medication unless specifically advised by your provider.    Daily activities:  First 4 days: Place cold, wet compresses on the eyes to minimize swelling.   Place a couple of ice cubes in a bowl of cool water and keep the bowl next you.   Dip a couple of gauze in the water then squeeze most of the water out so it does not drip down your face when placed on the eyelids.   Leave the compress on the eyes until they  are warm.   Do this a couple of times per hour when awake. Do NOT place ice directly on skin. Two weeks off of work is required but 3 weeks off is preferred. Working from home: Do not do meaningful work or make important decisions when on narcotic medication.  For your safety we require that an adult stay with you at least the first 24 hours and drive you to your next day follow up visit. Transportation via taxi/[employer] is not permitted.  Mobilize throughout the day; no permanent bed rest. We advise to walk around 3-5 times per day in addition to mobilizing for meals and to the bathroom.  You may shower and wash your face 48 hours after the procedure. Use lukewarm water with a gentle soap, and gently pat the incisions sites dry. Apply antibiotic ointment twice a day to incisions. No baths or prolonged showers of over 5-10 minutes.   You may gently wash your hair 48 hours after the procedure. Use a gentle shampoo, being very careful at the incision sites.  Rest with your head elevated above your heart for at least the first 7 days after surgery to help reduce the swelling. No bending, leaning forward, or bearing down while on the toilet as this will cause increased swelling and bleeding tendencies.   Avoid standing over hot stoves and surfaces, as this will also increase swelling.  Do NOT place ice directly on the skin.  Excessive talking and chewing should be avoided for the first few days.  A soft diet is recommended.  Regular exercise and lifting objects heavier than 10lbs is restricted for 4-6 weeks.   You may drive when looking over your shoulder does not cause pain and you are completely off all narcotic pain medication.    What to expect:  If undergoing general anesthesia, you may experience a sore throat.  This is not uncommon and should resolve in a few days.  You may use throat lozenges to help with the discomfort.  You will likely have two drain coming out from behind the ears. They will most likely be  removed within the first several days after surgery.   Most of your sutures will be removed on the 7th day following surgery and any remaining sutures will be removed the following week.   During the healing process, you will experience different sensations including discomfort, stinging, itching and  localized burning.   You should anticipate a variable degree of numbness in the areas treated after the procedure.   Much of the swelling usually subsides by 7-10 days.  Bruises and discoloration may take 3 weeks to resolve. Light make-up may be used to cover bruises after a week.  Scars take on average 12-18 months to fully mature. During this period, staff will advise an appropriate time to start scar management.   Everyone heals at a different pace. Be patient with your body during the recovery process.    Garments and dressings:  Do not remove any bandages or dressings unless told explicitly to do so.   You will have a big, bulky dressing around your face after surgery. Dressings must be worn continuously until the next follow up appointment. On average, we will remove most of the dressings the first post-operative day.   If given a neck binder, your physician will let you know for how long it must be worn and will demonstrate appropriate fit. Ensure it's not too tight. You may take off the binder for washing your face and showering.   If drains are placed, we will remove them at a time deemed appropriate. See [facility] section below.    Drain Care:    During your surgery Dr. Judith Part will insert a drainage tube, possibly more than one, to remove fluid that would otherwise accumulate. This helps you to heal faster and decrease the risk of complications. Your drain(s) will be removed when the amount that they drain in a 24-hour period decreases to approximately 20-25 cc.     How to empty your drain and record the volume:      Your drain should be emptied every 4-6 hours and recorded on the flow sheet on the other side  of this page. Remember, if you have more than one drain, record the amounts individually. The drains should be secured to your dressing or bra so there is no tension at the place where it enters your skin.       Hold the bulb gently in one hand and release the stopper. This will allow you to empty the fluid.      Once the plug is opened, this is the only way fluid will come out. There is a one way valve inside the bulb and you will not be able to squeeze fluid back up the drain. Prior to emptying bulb, record the amount of fluid (there are graduated markings on the bulb). Be careful not to spill or spray the contents to keep from staining your clothing or bedding. Make sure to measure by using milliliters (ml) or cubic centimeters (cc). Dispose contents into toilet.     Gently squeeze the bulb and replace the plug to re-establish the vacuum. If you check your drains and it is not collapsed, it is not able to drain the fluid. If you see solid material in the drain tube, you can carefully strip the material down into the bulb to prevent it from being blocked. Dr. Judith Part and the medical staff will teach you how to do this if it is necessary.       You should notify Dr. Judith Part:  If the drainage turns bright red and drains faster; if the amount is more than 50cc per hour for 2 hours in a row; if the drainage tube is accidentally pulled out; if the bulb will not stay compressed or hold a vacuum; if you have swelling or drainage at  the drain insertion site; or for any other concerns you have.        Date Time Drain 1 Drain 2  Amount                                                                                                                                                                                                            Please understand the physician may adjust the above instructions based on your progress and needs    Caring  For Your Jackson-Pratt (JP) Drain    A Jackson-Pratt (JP) Drain is used to help empty  excess fluid from the body after surgery. Use of a drain can help in the healing process.   The following instructions will help you care for your drain following your procedure.    How To Empty Your [facility] and dry your hands before emptying the drain.  Unplug the stopper on top of the bulb. Turn the bulb upside down and squeeze  contents into the measuring cup. Record the amount of fluid drained as needed   (usually 2 times per day). Include the date and time it was emptied.  Use an alcohol swab to wipe the stopper and bulb opening clean.  Squeeze the bulb until all the air is out and then replace the stopper. This will    create the suction necessary to remove the fluid from your body. The bulb  should remain compressed at all times unless emptying.  Once the bulb is closed and secured, wash your hands again.    Helpful Tips For Your Drain  When showering, try not to let your drain(s) dangle. Try looping your drain(s) around a belt, long necklace, or lanyard.  You may have a clear dressing covering the skin where the drain is placed. It is ok to shower with it on. If there is no dressing, ask your doctor if it is ok to apply antibiotic ointment to the site prior to showering.  There may be red stringy material in the drainage - do not worry! This material does, however, tend to block the tubing. As a result, you may need to strip the tubing:      With one hand, hold the tubing where it leaves the skin.   With the other hand, use either alcohol swabs (wrap the wipe around the tubing) or put lotion on your fingertips (to facilitate a smooth glide) and pinch the tubing.  Slowly and firmly pull your fingers down the tubing to flatten. This  will move the stringy material down the tube.    When To Call Your Surgeon  Always call your surgeon if you have questions or experience any of the following:  Fever, swelling, redness or warmth around the drain site  The tube falls out  Emptying the drain several times a  day and it is filling up quickly  The fluid color changes from light to very dark red  Fluid coming out from around the drain site      SCOPOLAMINE PATCH INSTRUCTION    You have had a scopolamine patch placed behind your  left arm to help prevent or alleviate nausea and vomiting after surgery.  Keep the patch dry, if possible, to prevent it from falling off. Limited contact with water, however, as in bathing or swimming, will not affect the system. If the patch falls off, throw it away and put a new one behind the other ear  Remove the patch 24-72 hours after your surgery. However you may remove the patch sooner if the side effects bother you.  After removing the patch, be sure to wash your hands and the area behind your ear with soap and water thoroughly.  To avoid accidental contact or ingestion by children or pets, fold the used patch in half with the sticky side together and dispose in a secure trash bin.  If you are still having nausea and/or vomiting the day after your surgery, contact your physician.  You may get drowsy or dizzy. Do not drive, use machinery, or do anything that needs mental alertness until you know how this medicine affects you. Do not stand or sit up quickly, especially if you are an older patient. This reduces the risk of dizzy or fainting spells. Alcohol may interfere with the effect of this medicine. Avoid alcoholic drinks.  Your mouth may get dry. Chewing sugarless gum or sucking hard candy, and drinking plenty of water may help. Contact your doctor if the problem does not go away or is severe.  This medicine may cause dry eyes and blurred vision. If you wear contact lenses you may feel some discomfort. Lubricating drops may help. See your eye doctor if the problem does not go away or is severe.  If you are going to have a magnetic resonance imaging (MRI) procedure, tell your MRI technician if you have this patch on your body. It must be removed before a MRI.    Side effects that you  should report to your doctor or health care professional as soon as possible:  agitation, nervousness, confusion, and delirium  blurred vision and other eye problems  dizziness, drowsiness  eye pain or redness in the whites of the eye  hallucinations  pain or difficulty passing urine  skin rash, itching  vomiting  Side effects that usually do not require medical attention (report to your doctor or health care professional if they continue or are bothersome):  headache  nausea  What may interact with this medicine?  benztropine  bethanechol  medicines for anxiety or sleeping problems like diazepam or temazepam  medicines for hay fever and other allergies  medicines for mental depression  muscle relaxants

## 2020-09-18 NOTE — Op Note (Cosign Needed)
FULL OPERATIVE NOTE    Date Time: 09/18/20 5:07 PM  Patient Name: Melinda Brown  Attending Physician: Marybelle Killings, MD      Date of Operation:   09/18/2020    Providers Performing:   Surgeon(s):  Bitar, Deno Lunger, MD  Kryssa Risenhoover, Kathrin Penner, MD    Circulator: Enid Baas, RN  Relief Circulator: Nevin Bloodgood, RN  Scrub Person: Janece Canterbury  Second Circulator: Eyvonne Left, RN    Operative Procedure:   Procedure(s):  EYELID LIFT LOWER BILATERAL, EYELID LIFT UPPER WITH BROWPEXY BILATERAL, FACELIFT INCLUDES NECKLIFT, FAT GRAFTING: LIPS AND SMILE LINES (COSMETIC)  FACELIFT INCLUDED NECKLIFT (COSMETIC)  FAT GRAFTING: LIPS AND SMILE LINES (COSMETIC) FAT HARVESTED FROM ABDOMINE     1. Upper and lower eyelid lifts and browpexies bilat. And frost sutures bilat.   2. Facelift with Neck liposuction 50 cc, SSNL and partial SMASectomy,   3. Fat grafting to face  : cheeks L=R= 7 cc each , NL folds L=R= 3 cc each, lips upper = lower = 6 cc total , chin = 5 cc    Preoperative Diagnosis:   Facial aging     Postoperative Diagnosis:   Facial aging     Indications:   The patient is a 60 year old female who came to my office requesting a facelift, along with upper and lower eyelid lift and facial fat grafting. On physical exam, the patient did have some laxity of the cheek and jowls, along with excess skin and fat of the neck. She also had laxity of her bilateral upper and lower eyelids and volume loss in the cheeks and chin. She had thin upper and lower lips and deep nasolabial folds. I discussed with the patient a facelift, including a suture suspension neck lift, possible SMAS plication, and upper and lower blepharoplasty, and facial fat grafting with an abdominal donor. The patient understood and agreed to the proposed procedures. All questions were answered, and the patient was cleared by her medical doctor, consents were signed, and preoperative photos taken. The patient was marked in the preoperative holding area and  taken to the operating room.    Operative Notes:   The patient was identified as Melinda Brown, given preoperative antibiotics, pneumatic stockings on lower extremities, and intubated uneventfully. A foley was placed. The face, neck, and lower abdomen were injected with tumescent solution. After the tumescent solution was allowed time to work, the patient's neck was liposuctioned to remove a small amount of fat using #4 flat cannulas. Next, a total of 80cc of fat in the lower abdomen was harvested via suction cannula. The 2 lower abdominal incisions were closed with 3-0 Monocryl and covered with Steri-strips. The fat was set aside on the back table for gravity decantation.      Next, the facelift incisions were made through the sideburns at the superior aspect of the ear, then curving just posterior to the tragus, around the earlobe to the posterior mid-ear and posterior to the conchal cartilage into the hair-bearing region of the scalp. The skin was elevated in concentric circles carefully until the hemifacial skin flaps were dissected. Excellent hemostasis was obtained. Then, the submental incision was made and dissection was carried out to expose the left and right platysmal muscles. Excellent hemostasis was again obtained. A suture suspension neck lift was performed by imbricating the left and right platysma muscles with 3-0 Prolene sutures, then placing 2 interlocking 3-0 Prolene sutures at the level of the hyoid bone. Both interlocking sutures  were passed through subcutaneous tunnels and one was sutured to the left mastoid fascia, the other sutured to the right mastoid fascia. Both knots were buried with 3-0 Monocryl. Next, the partial SMASectomy was performed by removing a 1cm strip of the SMAS and tightening the SMAS on both sides in a symmetric and equal fashion using 3-0 Monocryl. One #10 Jackson-Pratt flat drain was placed under each hemifacial flap through a separate stab incision in the posterior aspect  of the hair bearing scalp and secured with 3-0 Nylon sutures. The excess skin was excised and the facelift incisions were closed with 3-0 Monocryl for the deep dermis, 5-0 Prolene running sutures for the postauricular incisions, 4-0 Nylon interrupted for the pre-auricular incisions, and 5-0 chromic for the tragus. The submental incision was closed with 3-0 Monocryl and 4-0 Nylon suture. The patient had a very nice improvement of her cheek, jaw, and neck contour.      Next, attention was turned to the eyes. The upper and lower eyelids were marked for blepharoplasty with browpexy in the preoperative area. The upper eyelid skin was injected with 1% lidocaine with epinephrine. First, the excess upper eyelid skin was resected, followed by a strip of orbicularis oculi muscle. The middle and medial fat pads were identified, and an appropriate amount of fat was resected. Excellent hemostasis was obtained. A freer elevator was used to dissect along the mid to lateral orbital rim in the periosteal plane. Three 3-0 Monocryl interrupted sutures were used to suspend the deep fascia in a more elevated position. The upper eyelid skin was closed with running 5-0 Prolene sutures. Next, a subciliary incision was made for the lower eyelid lifts, and the skin was gently elevated. Middle, medial, and lateral fat pads were identified and partially excised. Some of the fat was merged with the cheek fat to give a very nice transition between the lower lids and cheek junction. Next, excellent hemostasis was obtained and a canthopexy was performed with a 3-0 Monocryl suture, securing the lateral canthus to the medial aspect of the lateral orbital wall. After the canthopexy suture was performed, a muscle sling was performed with the orbicularis oculi muscles sutured to the periosteum laterally to give the lower eyelids a little more support. Next, the excess skin of the lower eyelid was resected, and the incisions were closed with a few 5-0  Vicryl sutures to bring the skin together, followed by running 5-0 Prolene suture. The patient was noted to have very nice improvement of eyelid laxity. 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 The decision was made to place bilateral Frost sutures to elevate the lower lid and assist in eyelid closer for the next week. Both the eyelid sutures and Frost stitch were taped to the forehead and the temple region with Steri-strips.      Lastly, a 18 gauge needle was used to make 2 puncture holes 1 cm lateral to the oral commissure. The prepared fat was grafted to the upper and lower lips, nasolabial folds, cheeks, and chin. Amounts grafted noted above.     The face was again examined. There was no evidence of tension or hematoma. Dressings were placed and drains placed on bulb suction. The patient was awakened, extubated, and taken to the recovery room in no apparent distress. Foley will be removed in PACU.      Estimated Blood Loss:   100 cc  IVF= 3300 cc  U.o.= 150 cc  Tumescence= 400 cc    Implants:   *  No implants in log *    Drains:   Drains: Yes, Drain #2: Jackson-Pratt Flat 10 MM    Specimens:     ID Type Source Tests Collected by Time Destination   A :  No Specimen Required No Specimen NO PATHOLOGY Bitar, Deno Lunger, MD 09/18/2020 1213          Complications:   None    Signed by: Angelica Pou, MD, MD

## 2020-09-18 NOTE — Interval H&P Note (Signed)
ADMISSION HISTORY AND PHYSICAL EXAM    Date Time: 09/18/20 8:04 AM  Patient Name: Melinda Brown  Attending Physician: Marybelle Killings, MD    Assessment:   Facial aging   Upper and lower eyelid laxity   Volume deficiency lips    Plan:   Facelift with necklift  Upper and lower blepharoplasty  Facial fat grafting     History of Present Illness:   Melinda Brown is a 60 y.o. female who presents to the hospital with facial aging and upper and lower eyelid laxity.     Past Medical History:     Past Medical History:   Diagnosis Date    History of varicose veins     Seasonal allergic rhinitis        Past Surgical History:     Past Surgical History:   Procedure Laterality Date    APPENDECTOMY (OPEN)  06/08/2001    AUGMENTATION, BREAST, (MEDICAL)  2011    COLONOSCOPY  07/10/2011    VASCULAR SURGERY  10/06/2000    vericose vein stripping       Family History:     Family History   Problem Relation Age of Onset    Heart disease Mother     Asthma Daughter         OLDEST       Social History:     Social History     Socioeconomic History    Marital status: Legally Separated     Spouse name: Not on file    Number of children: Not on file    Years of education: Not on file    Highest education level: Not on file   Occupational History    Not on file   Tobacco Use    Smoking status: Never Smoker    Smokeless tobacco: Never Used   Vaping Use    Vaping Use: Never used   Substance and Sexual Activity    Alcohol use: Yes     Alcohol/week: 6.0 standard drinks     Types: 6 Standard drinks or equivalent per week    Drug use: Never    Sexual activity: Yes     Partners: Male     Birth control/protection: None   Other Topics Concern    Not on file   Social History Narrative    Not on file     Social Determinants of Health     Financial Resource Strain: Not on file   Food Insecurity: Not on file   Transportation Needs: Not on file   Physical Activity: Not on file   Stress: Not on file   Social Connections: Not on file   Intimate Partner  Violence: Not on file   Housing Stability: Not on file       Allergies:   No Known Allergies    Medications:     Medications Prior to Admission   Medication Sig    zolpidem (AMBIEN CR) 12.5 MG CR tablet TAKE ONE TABLET BY MOUTH AT BEDTIME AS NEEDED FOR SLEEP    clonazePAM (KlonoPIN) 0.5 MG tablet TAKE ONE TABLET BY MOUTH AT BEDTIME AS NEEDED.       Review of Systems:   A comprehensive review of systems was: Negative except hpi    Physical Exam:     Vitals:    09/18/20 0754   BP: 128/80   Pulse: 72   Resp: 16   Temp: 97.3 F (36.3 C)   SpO2: 98%  Intake and Output Summary (Last 24 hours) at Date Time  No intake or output data in the 24 hours ending 09/18/20 0804    General appearance - alert, well appearing, and in no distress  GEN: awake, alert, sitting comfortably in chair in no apparent distress  HEENT: upper and lower eyelid laxity, protruding fat bilateral lower lids L>R, moderate-severe jowls, facial skin laxity, blunted cervicomental angle  CV: RRR  RES: unlabored on RA  ABD: soft, non-tender, non-distended  EXT: WWP, 2+ pulses       Labs:     Results       ** No results found for the last 24 hours. **            wnl    Rads:   Radiological Procedure reviewed.    Signed by: Angelica Pou, MD

## 2020-09-19 ENCOUNTER — Encounter: Payer: Self-pay | Admitting: Plastic Surgery

## 2020-10-08 ENCOUNTER — Other Ambulatory Visit: Payer: Self-pay | Admitting: Neurological Surgery

## 2020-10-18 NOTE — Progress Notes (Signed)
Surgical Instructions    Your procedure is scheduled on Thursday, May 19th,2022.  Report to [facility] Main Entrance "A" at 12:00 P.M., then check in with the Admitting office.  Call this number if you have problems the morning of surgery:  [phone]   If you have any questions prior to your surgery date call [phone]: Open Monday-Friday 8am-4pm    Remember:  Do not eat or drink after midnight the night before your surgery  As of today, STOP taking any Aspirin (unless otherwise instructed by your surgeon) Aleve, Naproxen, Ibuprofen, Motrin, Advil, Goody's, BC's, all herbal medications, fish oil, and all vitamins.                     Do not wear jewelry, make up, or nail polish            Do not wear lotions, powders, perfumes, or deodorant.            Do not shave 48 hours prior to surgery.              Do not bring valuables to the hospital.            [facility] is not responsible for any belongings or valuables.  Do NOT Smoke (Tobacco/Vaping) or drink Alcohol 24 hours prior to your procedure If you use a CPAP at night, you may bring all equipment for your overnight stay.   Contacts, glasses, dentures or bridgework may not be worn into surgery, please bring cases for these belongings   For patients admitted to the hospital, discharge time will be determined by your treatment team.   Patients discharged the day of surgery will not be allowed to drive home, and someone needs to stay with them for 24 hours.    Special instructions:   [place]- Preparing For Surgery  Before surgery, you can play an important role. Because skin is not sterile, your skin needs to be as free of germs as possible. You can reduce the number of germs on your skin by washing with CHG (chlorahexidine gluconate) Soap before surgery.  CHG is an antiseptic cleaner which kills germs and bonds with the skin to continue killing germs even after washing.    Oral Hygiene is also important to reduce  your risk of infection.  Remember - BRUSH YOUR TEETH THE MORNING OF SURGERY WITH YOUR REGULAR TOOTHPASTE  Please do not use if you have an allergy to CHG or antibacterial soaps. If your skin becomes reddened/irritated stop using the CHG.  Do not shave (including legs and underarms) for at least 48 hours prior to first CHG shower. It is OK to shave your face.  Please follow these instructions carefully.   1. Shower the NIGHT BEFORE SURGERY and the MORNING OF SURGERY  2. If you chose to wash your hair, wash your hair first as usual with your normal shampoo.  3. After you shampoo, rinse your hair and body thoroughly to remove the shampoo.  4. Use CHG Soap as you would any other liquid soap. You can apply CHG directly to the skin and wash gently with a scrungie or a clean washcloth.   5. Apply the CHG Soap to your body ONLY FROM THE NECK DOWN.  Do not use on open wounds or open sores. Avoid contact with your eyes, ears, mouth and genitals (private parts). Wash Face and genitals (private parts)  with your normal soap.   6. Wash thoroughly, paying special attention  to the area where your surgery will be performed.  7. Thoroughly rinse your body with warm water from the neck down.  8. DO NOT shower/wash with your normal soap after using and rinsing off the CHG Soap.  9. Pat yourself dry with a CLEAN TOWEL.  10. Wear CLEAN PAJAMAS to bed the night before surgery  11. Place CLEAN SHEETS on your bed the night before your surgery  12. DO NOT SLEEP WITH PETS.   Day of Surgery: Take a shower with CHG soap. Wear Clean/Comfortable clothing the morning of surgery Do not apply any deodorants/lotions.   Remember to brush your teeth WITH YOUR REGULAR TOOTHPASTE.   Please read over the following fact sheets that you were given.

## 2020-10-21 ENCOUNTER — Other Ambulatory Visit: Payer: Self-pay

## 2020-10-21 ENCOUNTER — Encounter (HOSPITAL_COMMUNITY)
Admission: RE | Admit: 2020-10-21 | Discharge: 2020-10-21 | Disposition: A | Discharge status: Home / Self Care | Payer: Medicaid Other | Source: Ambulatory Visit | Attending: Neurological Surgery | Admitting: Neurological Surgery

## 2020-10-21 ENCOUNTER — Encounter (HOSPITAL_COMMUNITY): Payer: Self-pay

## 2020-10-21 DIAGNOSIS — Z20822 Contact with and (suspected) exposure to covid-19: Secondary | ICD-10-CM | POA: Diagnosis not present

## 2020-10-21 DIAGNOSIS — Z01818 Encounter for other preprocedural examination: Secondary | ICD-10-CM | POA: Diagnosis present

## 2020-10-21 HISTORY — DX: Gastro-esophageal reflux disease without esophagitis: K21.9

## 2020-10-21 LAB — TYPE AND SCREEN
ABO/RH(D): A NEG
Antibody Screen: NEGATIVE

## 2020-10-21 LAB — BASIC METABOLIC PANEL
Anion gap: 9 (ref 5–15)
BUN: 9 mg/dL (ref 6–20)
CO2: 23 mmol/L (ref 22–32)
Calcium: 9.7 mg/dL (ref 8.9–10.3)
Chloride: 102 mmol/L (ref 98–111)
Creatinine, Ser: 0.9 mg/dL (ref 0.44–1.00)
GFR, Estimated: >60 mL/min (ref >60)
Glucose, Bld: 103 mg/dL — ABNORMAL HIGH (ref 70–99)
Potassium: 4.3 mmol/L (ref 3.5–5.1)
Sodium: 134 mmol/L — ABNORMAL LOW (ref 135–145)

## 2020-10-21 LAB — CBC
HCT: 38.7 % (ref 36.0–46.0)
Hemoglobin: 13.1 g/dL (ref 12.0–15.0)
MCH: 33.5 pg (ref 26.0–34.0)
MCHC: 33.9 g/dL (ref 30.0–36.0)
MCV: 99 fL (ref 80.0–100.0)
Platelets: 261 10*3/uL (ref 150–400)
RBC: 3.91 MIL/uL (ref 3.87–5.11)
RDW: 12.7 % (ref 11.5–15.5)
WBC: 11.2 10*3/uL — ABNORMAL HIGH (ref 4.0–10.5)
nRBC: 0 % (ref 0.0–0.2)

## 2020-10-21 LAB — SURGICAL PCR SCREEN
MRSA, PCR: NEGATIVE
Staphylococcus aureus: NEGATIVE

## 2020-10-21 NOTE — Progress Notes (Signed)
PCP - Raliegh Ip, FNP Cardiologist - denies  PPM/ICD - denies Device Orders - N/A Rep Notified - N/A  Chest x-ray - N/A EKG - 10/21/2020 Stress Test - denies ECHO - denies Cardiac Cath - denies  Sleep Study - denies CPAP - N/A  Fasting Blood Sugar - N/A  Blood Thinner Instructions: N/A Aspirin Instructions: Patient was instructed: As of today, STOP taking any Aspirin (unless otherwise instructed by your surgeon) Aleve, Naproxen, Ibuprofen, Motrin, Advil, Goody's, BC's, all herbal medications, fish oil, and all vitamins  ERAS Protcol - No  COVID TEST-  10/21/2020  Anesthesia review: yes  Patient denies shortness of breath, fever, cough and chest pain at PAT appointment   All instructions explained to the patient, with a verbal understanding of the material. Patient agrees to go over the instructions while at home for a better understanding. Patient also instructed to self quarantine after being tested for COVID-19. The opportunity to ask questions was provided.

## 2020-10-22 LAB — SARS CORONAVIRUS 2 (TAT 6-24 HRS): SARS Coronavirus 2: NEGATIVE

## 2020-10-23 NOTE — Progress Notes (Signed)
Lynn Black made aware of the surgery time changing to [phone], arrive at 1020.

## 2020-10-24 ENCOUNTER — Ambulatory Visit (HOSPITAL_COMMUNITY): Payer: Medicaid Other

## 2020-10-24 ENCOUNTER — Encounter (HOSPITAL_COMMUNITY): Payer: Self-pay | Admitting: Neurological Surgery

## 2020-10-24 ENCOUNTER — Encounter (HOSPITAL_COMMUNITY)
Admission: RE | Disposition: A | Discharge status: Home / Self Care | Payer: Self-pay | Source: Home / Self Care | Attending: Neurological Surgery

## 2020-10-24 ENCOUNTER — Observation Stay (HOSPITAL_COMMUNITY)
Admission: RE | Admit: 2020-10-24 | Discharge: 2020-10-25 | Disposition: A | Discharge status: Home / Self Care | Payer: Medicaid Other | Attending: Neurological Surgery | Admitting: Neurological Surgery

## 2020-10-24 ENCOUNTER — Other Ambulatory Visit: Payer: Self-pay

## 2020-10-24 ENCOUNTER — Ambulatory Visit (HOSPITAL_COMMUNITY): Payer: Medicaid Other | Admitting: Physician Assistant

## 2020-10-24 DIAGNOSIS — M4712 Other spondylosis with myelopathy, cervical region: Secondary | ICD-10-CM | POA: Insufficient documentation

## 2020-10-24 DIAGNOSIS — Z91040 Latex allergy status: Secondary | ICD-10-CM | POA: Diagnosis not present

## 2020-10-24 DIAGNOSIS — Z419 Encounter for procedure for purposes other than remedying health state, unspecified: Secondary | ICD-10-CM

## 2020-10-24 DIAGNOSIS — M4722 Other spondylosis with radiculopathy, cervical region: Principal | ICD-10-CM | POA: Insufficient documentation

## 2020-10-24 DIAGNOSIS — F17210 Nicotine dependence, cigarettes, uncomplicated: Secondary | ICD-10-CM | POA: Insufficient documentation

## 2020-10-24 HISTORY — PX: ANTERIOR CERVICAL DECOMP/DISCECTOMY FUSION: SHX1161

## 2020-10-24 SURGERY — ANTERIOR CERVICAL DECOMPRESSION/DISCECTOMY FUSION 2 LEVELS
Anesthesia: General | Site: Spine Cervical

## 2020-10-24 MED ORDER — PHENYLEPHRINE HCL-NACL 10-0.9 MG/250ML-% IV SOLN
INTRAVENOUS | Status: DC | PRN
  Administered 2020-10-24: 15 ug/min via INTRAVENOUS

## 2020-10-24 MED ORDER — DEXAMETHASONE SODIUM PHOSPHATE 10 MG/ML IJ SOLN
INTRAMUSCULAR | Status: AC
  Filled 2020-10-24: qty 1

## 2020-10-24 MED ORDER — ONDANSETRON HCL 4 MG PO TABS: 4.0000 mg | ORAL_TABLET | Freq: Four times a day (QID) | ORAL | Status: DC | PRN

## 2020-10-24 MED ORDER — LIDOCAINE-EPINEPHRINE 1 %-1:100000 IJ SOLN
INTRAMUSCULAR | Status: AC
  Filled 2020-10-24: qty 1

## 2020-10-24 MED ORDER — BUPIVACAINE HCL (PF) 0.5 % IJ SOLN
INTRAMUSCULAR | Status: AC
  Filled 2020-10-24: qty 30

## 2020-10-24 MED ORDER — OXYCODONE-ACETAMINOPHEN 5-325 MG PO TABS
1.0000 | ORAL_TABLET | ORAL | Status: DC | PRN
  Administered 2020-10-24 – 2020-10-25 (×3): 2 via ORAL
  Filled 2020-10-24 (×3): qty 2

## 2020-10-24 MED ORDER — TRAZODONE HCL 50 MG PO TABS
50.0000 mg | ORAL_TABLET | Freq: Every evening | ORAL | Status: DC | PRN
  Filled 2020-10-24: qty 1

## 2020-10-24 MED ORDER — CHLORHEXIDINE GLUCONATE 0.12 % MT SOLN: 15.0000 mL | Freq: Once | OROMUCOSAL | Status: AC

## 2020-10-24 MED ORDER — ALBUTEROL SULFATE HFA 108 (90 BASE) MCG/ACT IN AERS
INHALATION_SPRAY | RESPIRATORY_TRACT | Status: AC
  Filled 2020-10-24: qty 6.7

## 2020-10-24 MED ORDER — BISACODYL 10 MG RE SUPP: 10.0000 mg | Freq: Every day | RECTAL | Status: DC | PRN

## 2020-10-24 MED ORDER — DOCUSATE SODIUM 100 MG PO CAPS
100.0000 mg | ORAL_CAPSULE | Freq: Two times a day (BID) | ORAL | Status: DC
  Administered 2020-10-24 – 2020-10-25 (×2): 100 mg via ORAL
  Filled 2020-10-24 (×2): qty 1

## 2020-10-24 MED ORDER — BUPIVACAINE HCL (PF) 0.5 % IJ SOLN
INTRAMUSCULAR | Status: DC | PRN
  Administered 2020-10-24: 5 mL

## 2020-10-24 MED ORDER — DEXAMETHASONE SODIUM PHOSPHATE 10 MG/ML IJ SOLN
INTRAMUSCULAR | Status: DC | PRN
  Administered 2020-10-24: 10 mg via INTRAVENOUS

## 2020-10-24 MED ORDER — PROPOFOL 10 MG/ML IV BOLUS
INTRAVENOUS | Status: AC
  Filled 2020-10-24: qty 20

## 2020-10-24 MED ORDER — SENNA 8.6 MG PO TABS
1.0000 | ORAL_TABLET | Freq: Two times a day (BID) | ORAL | Status: DC
  Administered 2020-10-24 – 2020-10-25 (×2): 8.6 mg via ORAL
  Filled 2020-10-24 (×2): qty 1

## 2020-10-24 MED ORDER — LACTATED RINGERS IV SOLN: INTRAVENOUS | Status: DC | PRN

## 2020-10-24 MED ORDER — ACETAMINOPHEN 10 MG/ML IV SOLN
INTRAVENOUS | Status: DC | PRN
  Administered 2020-10-24: 1000 mg via INTRAVENOUS

## 2020-10-24 MED ORDER — MENTHOL 3 MG MT LOZG: 1.0000 | LOZENGE | OROMUCOSAL | Status: DC | PRN

## 2020-10-24 MED ORDER — FENTANYL CITRATE (PF) 250 MCG/5ML IJ SOLN
INTRAMUSCULAR | Status: AC
  Filled 2020-10-24: qty 5

## 2020-10-24 MED ORDER — PHENOL 1.4 % MT LIQD: 1.0000 | OROMUCOSAL | Status: DC | PRN

## 2020-10-24 MED ORDER — SODIUM CHLORIDE 0.9% FLUSH
3.0000 mL | Freq: Two times a day (BID) | INTRAVENOUS | Status: DC
  Administered 2020-10-24: 3 mL via INTRAVENOUS

## 2020-10-24 MED ORDER — FENTANYL CITRATE (PF) 250 MCG/5ML IJ SOLN
INTRAMUSCULAR | Status: DC | PRN
  Administered 2020-10-24 (×2): 50 ug via INTRAVENOUS
  Administered 2020-10-24: 100 ug via INTRAVENOUS
  Administered 2020-10-24: 50 ug via INTRAVENOUS
  Administered 2020-10-24: 100 ug via INTRAVENOUS
  Administered 2020-10-24: 50 ug via INTRAVENOUS

## 2020-10-24 MED ORDER — ONDANSETRON HCL 4 MG/2ML IJ SOLN
INTRAMUSCULAR | Status: DC | PRN
  Administered 2020-10-24: 4 mg via INTRAVENOUS

## 2020-10-24 MED ORDER — FENTANYL CITRATE (PF) 100 MCG/2ML IJ SOLN: 25.0000 ug | INTRAMUSCULAR | Status: DC | PRN

## 2020-10-24 MED ORDER — ACETAMINOPHEN 500 MG PO TABS: 1000.0000 mg | ORAL_TABLET | Freq: Once | ORAL | Status: DC

## 2020-10-24 MED ORDER — CHLORHEXIDINE GLUCONATE 0.12 % MT SOLN
OROMUCOSAL | Status: AC
  Administered 2020-10-24: 15 mL via OROMUCOSAL
  Filled 2020-10-24: qty 15

## 2020-10-24 MED ORDER — ALBUTEROL SULFATE HFA 108 (90 BASE) MCG/ACT IN AERS
INHALATION_SPRAY | RESPIRATORY_TRACT | Status: DC | PRN
  Administered 2020-10-24: 6 via RESPIRATORY_TRACT

## 2020-10-24 MED ORDER — PROMETHAZINE HCL 25 MG/ML IJ SOLN: 6.2500 mg | INTRAMUSCULAR | Status: DC | PRN

## 2020-10-24 MED ORDER — CEFAZOLIN SODIUM-DEXTROSE 2-4 GM/100ML-% IV SOLN
2.0000 g | INTRAVENOUS | Status: AC
  Administered 2020-10-24: 2 g via INTRAVENOUS

## 2020-10-24 MED ORDER — LIDOCAINE-EPINEPHRINE 1 %-1:100000 IJ SOLN
INTRAMUSCULAR | Status: DC | PRN
  Administered 2020-10-24: 5 mL

## 2020-10-24 MED ORDER — ORAL CARE MOUTH RINSE: 15.0000 mL | Freq: Once | OROMUCOSAL | Status: AC

## 2020-10-24 MED ORDER — POLYETHYLENE GLYCOL 3350 17 G PO PACK: 17.0000 g | PACK | Freq: Every day | ORAL | Status: DC | PRN

## 2020-10-24 MED ORDER — ACETAMINOPHEN 325 MG PO TABS: 650.0000 mg | ORAL_TABLET | ORAL | Status: DC | PRN

## 2020-10-24 MED ORDER — ALUM & MAG HYDROXIDE-SIMETH 200-200-20 MG/5ML PO SUSP
30.0000 mL | Freq: Four times a day (QID) | ORAL | Status: DC | PRN
  Administered 2020-10-25: 30 mL via ORAL
  Filled 2020-10-24: qty 30

## 2020-10-24 MED ORDER — CHLORHEXIDINE GLUCONATE CLOTH 2 % EX PADS: 6.0000 | MEDICATED_PAD | Freq: Once | CUTANEOUS | Status: DC

## 2020-10-24 MED ORDER — FLEET ENEMA 7-19 GM/118ML RE ENEM: 1.0000 | ENEMA | Freq: Once | RECTAL | Status: DC | PRN

## 2020-10-24 MED ORDER — MIDAZOLAM HCL 2 MG/2ML IJ SOLN
INTRAMUSCULAR | Status: AC
  Filled 2020-10-24: qty 2

## 2020-10-24 MED ORDER — ACETAMINOPHEN 10 MG/ML IV SOLN
INTRAVENOUS | Status: AC
  Filled 2020-10-24: qty 100

## 2020-10-24 MED ORDER — MIDAZOLAM HCL 2 MG/2ML IJ SOLN
INTRAMUSCULAR | Status: DC | PRN
  Administered 2020-10-24: 2 mg via INTRAVENOUS

## 2020-10-24 MED ORDER — SUGAMMADEX SODIUM 200 MG/2ML IV SOLN
INTRAVENOUS | Status: DC | PRN
  Administered 2020-10-24: 200 mg via INTRAVENOUS

## 2020-10-24 MED ORDER — PROPOFOL 10 MG/ML IV BOLUS
INTRAVENOUS | Status: DC | PRN
  Administered 2020-10-24: 200 mg via INTRAVENOUS

## 2020-10-24 MED ORDER — ROCURONIUM BROMIDE 10 MG/ML (PF) SYRINGE
PREFILLED_SYRINGE | INTRAVENOUS | Status: AC
  Filled 2020-10-24: qty 10

## 2020-10-24 MED ORDER — LIDOCAINE 2% (20 MG/ML) 5 ML SYRINGE
INTRAMUSCULAR | Status: AC
  Filled 2020-10-24: qty 5

## 2020-10-24 MED ORDER — ONDANSETRON HCL 4 MG/2ML IJ SOLN
INTRAMUSCULAR | Status: AC
  Filled 2020-10-24: qty 2

## 2020-10-24 MED ORDER — ONDANSETRON HCL 4 MG/2ML IJ SOLN: 4.0000 mg | Freq: Four times a day (QID) | INTRAMUSCULAR | Status: DC | PRN

## 2020-10-24 MED ORDER — SODIUM CHLORIDE 0.9% FLUSH: 3.0000 mL | INTRAVENOUS | Status: DC | PRN

## 2020-10-24 MED ORDER — 0.9 % SODIUM CHLORIDE (POUR BTL) OPTIME
TOPICAL | Status: DC | PRN
  Administered 2020-10-24: 1000 mL

## 2020-10-24 MED ORDER — SODIUM CHLORIDE 0.9 % IV SOLN: 250.0000 mL | INTRAVENOUS | Status: DC

## 2020-10-24 MED ORDER — THROMBIN 5000 UNITS EX SOLR: OROMUCOSAL | Status: DC | PRN

## 2020-10-24 MED ORDER — CEFAZOLIN SODIUM-DEXTROSE 2-4 GM/100ML-% IV SOLN
2.0000 g | Freq: Three times a day (TID) | INTRAVENOUS | Status: AC
  Administered 2020-10-24 – 2020-10-25 (×2): 2 g via INTRAVENOUS
  Filled 2020-10-24 (×2): qty 100

## 2020-10-24 MED ORDER — ROCURONIUM BROMIDE 10 MG/ML (PF) SYRINGE
PREFILLED_SYRINGE | INTRAVENOUS | Status: DC | PRN
  Administered 2020-10-24: 50 mg via INTRAVENOUS
  Administered 2020-10-24 (×2): 20 mg via INTRAVENOUS

## 2020-10-24 MED ORDER — GABAPENTIN 300 MG PO CAPS: 300.0000 mg | ORAL_CAPSULE | Freq: Every day | ORAL | Status: DC

## 2020-10-24 MED ORDER — CEFAZOLIN SODIUM-DEXTROSE 2-4 GM/100ML-% IV SOLN
INTRAVENOUS | Status: AC
  Filled 2020-10-24: qty 100

## 2020-10-24 MED ORDER — LACTATED RINGERS IV SOLN: INTRAVENOUS | Status: DC

## 2020-10-24 MED ORDER — MORPHINE SULFATE (PF) 2 MG/ML IV SOLN: 2.0000 mg | INTRAVENOUS | Status: DC | PRN

## 2020-10-24 MED ORDER — METHOCARBAMOL 500 MG PO TABS
500.0000 mg | ORAL_TABLET | Freq: Four times a day (QID) | ORAL | Status: DC | PRN
  Administered 2020-10-24 – 2020-10-25 (×3): 500 mg via ORAL
  Filled 2020-10-24 (×3): qty 1

## 2020-10-24 MED ORDER — METHOCARBAMOL 1000 MG/10ML IJ SOLN
500.0000 mg | Freq: Four times a day (QID) | INTRAVENOUS | Status: DC | PRN
  Filled 2020-10-24: qty 5

## 2020-10-24 MED ORDER — LIDOCAINE 2% (20 MG/ML) 5 ML SYRINGE
INTRAMUSCULAR | Status: DC | PRN
  Administered 2020-10-24: 60 mg via INTRAVENOUS

## 2020-10-24 MED ORDER — ACETAMINOPHEN 650 MG RE SUPP: 650.0000 mg | RECTAL | Status: DC | PRN

## 2020-10-24 MED ORDER — THROMBIN 5000 UNITS EX SOLR
CUTANEOUS | Status: AC
  Filled 2020-10-24: qty 5000

## 2020-10-24 SURGICAL SUPPLY — 56 items
ADH SKN CLS APL DERMABOND .7 (GAUZE/BANDAGES/DRESSINGS) ×1
BAND INSRT 18 STRL LF DISP RB (MISCELLANEOUS)
BAND RUBBER #18 3X1/16 STRL (MISCELLANEOUS) IMPLANT
BIT DRILL ACP 15 (DRILL) IMPLANT
BIT DRILL NEURO 2X3.1 SFT TUCH (MISCELLANEOUS) ×1 IMPLANT
BNDG GAUZE ELAST 4 BULKY (GAUZE/BANDAGES/DRESSINGS) IMPLANT
BUR BARREL STRAIGHT FLUTE 4.0 (BURR) IMPLANT
CAGE CERV MOD 5X15X12 7D (Cage) ×2 IMPLANT
CAGE CERV MOD 6X15X12 7D (Cage) ×2 IMPLANT
CANISTER SUCT 3000ML PPV (MISCELLANEOUS) ×3 IMPLANT
COVER WAND RF STERILE (DRAPES) ×1 IMPLANT
DECANTER SPIKE VIAL GLASS SM (MISCELLANEOUS) ×3 IMPLANT
DERMABOND ADVANCED (GAUZE/BANDAGES/DRESSINGS) ×2
DERMABOND ADVANCED .7 DNX12 (GAUZE/BANDAGES/DRESSINGS) ×1 IMPLANT
DRAPE LAPAROTOMY 100X72 PEDS (DRAPES) ×3 IMPLANT
DRAPE MICROSCOPE LEICA (MISCELLANEOUS) IMPLANT
DRILL ACP 15 (DRILL) ×3
DRILL NEURO 2X3.1 SOFT TOUCH (MISCELLANEOUS) ×3
DURAPREP 6ML APPLICATOR 50/CS (WOUND CARE) ×3 IMPLANT
ELECT COATED BLADE 2.86 ST (ELECTRODE) ×3 IMPLANT
ELECT REM PT RETURN 9FT ADLT (ELECTROSURGICAL) ×3
ELECTRODE REM PT RTRN 9FT ADLT (ELECTROSURGICAL) ×1 IMPLANT
GAUZE 4X4 16PLY RFD (DISPOSABLE) ×2 IMPLANT
GAUZE SPONGE 4X4 12PLY STRL (GAUZE/BANDAGES/DRESSINGS) ×3 IMPLANT
GLOVE BIOGEL PI IND STRL 8.5 (GLOVE) ×1 IMPLANT
GLOVE BIOGEL PI INDICATOR 8.5 (GLOVE) ×2
GLOVE ECLIPSE 8.5 STRL (GLOVE) ×3 IMPLANT
GLOVE EXAM NITRILE XL STR (GLOVE) IMPLANT
GLOVE SRG 8 PF TXTR STRL LF DI (GLOVE) IMPLANT
GLOVE SURG UNDER POLY LF SZ8 (GLOVE) ×6
GOWN STRL REUS W/ TWL LRG LVL3 (GOWN DISPOSABLE) IMPLANT
GOWN STRL REUS W/ TWL XL LVL3 (GOWN DISPOSABLE) IMPLANT
GOWN STRL REUS W/TWL 2XL LVL3 (GOWN DISPOSABLE) ×3 IMPLANT
GOWN STRL REUS W/TWL LRG LVL3 (GOWN DISPOSABLE) ×3
GOWN STRL REUS W/TWL XL LVL3 (GOWN DISPOSABLE)
HALTER HD/CHIN CERV TRACTION D (MISCELLANEOUS) ×3 IMPLANT
HEMOSTAT POWDER KIT SURGIFOAM (HEMOSTASIS) ×3 IMPLANT
KIT BASIN OR (CUSTOM PROCEDURE TRAY) ×3 IMPLANT
KIT TURNOVER KIT B (KITS) ×3 IMPLANT
NDL SPNL 22GX3.5 QUINCKE BK (NEEDLE) ×1 IMPLANT
NEEDLE HYPO 22GX1.5 SAFETY (NEEDLE) ×3 IMPLANT
NEEDLE SPNL 22GX3.5 QUINCKE BK (NEEDLE) ×3 IMPLANT
NS IRRIG 1000ML POUR BTL (IV SOLUTION) ×3 IMPLANT
PACK LAMINECTOMY NEURO (CUSTOM PROCEDURE TRAY) ×3 IMPLANT
PAD ARMBOARD 7.5X6 YLW CONV (MISCELLANEOUS) ×9 IMPLANT
PATTIES SURGICAL .5 X1 (DISPOSABLE) ×3 IMPLANT
PLATE ACP 1-LEVEL 1.6V20 (Plate) ×2 IMPLANT
PLATE ACP 1-LEVEL1.6V22 (Plate) ×2 IMPLANT
PUTTY DBM PROPEL SM (Putty) ×2 IMPLANT
SCREW ACP VA ST 3.5X15 (Screw) ×16 IMPLANT
SET WALTER ACTIVATION W/DRAPE (SET/KITS/TRAYS/PACK) ×3 IMPLANT
SPONGE INTESTINAL PEANUT (DISPOSABLE) ×3 IMPLANT
SUT VIC AB 4-0 RB1 18 (SUTURE) ×7 IMPLANT
TOWEL GREEN STERILE (TOWEL DISPOSABLE) ×3 IMPLANT
TOWEL GREEN STERILE FF (TOWEL DISPOSABLE) ×3 IMPLANT
WATER STERILE IRR 1000ML POUR (IV SOLUTION) ×3 IMPLANT

## 2020-10-24 NOTE — Progress Notes (Signed)
Orthopedic Tech Progress Note Patient Details:  Lynn Black [birth year] [id number]  Ortho Devices Type of Ortho Device: Soft collar Ortho Device/Splint Interventions: Ordered,Application,Adjustment   Post Interventions Patient Tolerated: Well Instructions Provided: Adjustment of device,Care of device,Poper ambulation with device   Gerald Stabs 10/24/2020, 3:49 PM

## 2020-10-24 NOTE — Op Note (Signed)
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  Removal of previously placed plate H4-V4. Surgeon: Barnett Abu Anesthesia: General endotracheal Indications: Lynn Black is a 60 year old individual whose had significant cervical spondylosis.  She has previously had decompression and fusion at C6-7 then again at C4-5 and 5 6.  She has now developed adjacent level disease at C3-4 with cord compression at C7-T1 with by foraminal stenosis.  She has both radiculopathic and myelopathic symptoms.  She has been advised regarding the need for decompression and stabilization of the adjacent levels C3-4 and C7-T1.  Procedure: Patient was brought to the operating room placed on table supine position.  After the smooth induction of general endotracheal anesthesia she was placed in 5 pounds of halter traction.  The neck was prepped with alcohol DuraPrep and draped in a sterile fashion.  Incision was made on the left side of the neck and the previous scar and this was carried down through the platysma.  The plane between the sternocleidomastoid and the strap muscles was then carefully dissected until the prevertebral space was reached.  The top of the old plate was identified and C3-4 above this was identified and isolated the longus coli muscle was stripped of either side of midline and the plate was exposed to identify the screws that had previously been securing the plate.  The screws were loosened and removed and ultimately the plate was lifted off the vertebrae and removed in total.  Hemostasis in the small screw holes was obtained with some Gelfoam soaked thrombin and later was irrigated away.  The C3-4 disc space was then opened a self-retaining  retractor with the [facility] surgical arm was used to help maintain exposure.  The space was evacuated of substantial quantity of severely degenerated desiccated disc material significant osteophytes from the lateral recesses were removed and this decompressed the path of the C for nerve roots laterally and the common dural tube medially.  Once the decompression was completed the endplates were shaved smoothed and it was felt that a 5 mm modulus spacer with 7 degrees of lordosis would fit best into this interval.  This was filled with autograft and allograft and placed into the interval.  A 22 mm ACP plate was then affixed to the ventral aspect the vertebral bodies with 15 mm screws.  Once this was completed a separate incision was created in the bottom portion of the neck and carried down through the platysma.  Dissection was carried out so that the prevertebral space was reached once identified last disc space notable was that of C7-T1.  This was then secured by pulling back the longus coli muscle and using a self-retaining retractor.  The disc space was then entered a substantial quantity of severely degenerated desiccated disc material was removed and the procedure was continued until a self-retaining discredit could be placed into the disc space.  This allowed clearance of the posterior aspect of the disc space out to and including the foramen of the C8 nerve root on the right and then on the left once these areas were decompressed and hemostasis was achieved interspace was sized for an appropriate size spacer and he was felt that a 6 mm modulus plate would fit best this was filled with demineralized bone matrix as well as the other modulus spacer placed into  the interspace.  Traction was removed.  Some additional demineralized bone matrix was packed into the lateral aspects of the grafts at both the C3-4 and C7-T1 levels.  Then a 22 mm standard size ACP plate was affixed to the ventral aspects of the vertebral  bodies C7 and T1 with 15 mm screws hemostasis in the wound was checked carefully and final radiograph identified good confirmation of the hardware.  Once hemostasis was adequately obtained the platysma was closed with 4-0 Vicryl in interrupted fashion and 4-0 Vicryl was used in the subcuticular skin Dermabond was used on the skin and blood loss for the entire procedure was estimated at approximately 100 cc.

## 2020-10-24 NOTE — Anesthesia Preprocedure Evaluation (Signed)
Anesthesia Evaluation  Patient identified by MRN, date of birth, ID band Patient awake    Reviewed: Allergy & Precautions, NPO status , Patient's Chart, lab work & pertinent test results  Airway Mallampati: II  TM Distance: >3 FB Neck ROM: Full    Dental  (+) Dental Advisory Given   Pulmonary Current Smoker and Patient abstained from smoking.,    breath sounds clear to auscultation       Cardiovascular hypertension,  Rhythm:Regular Rate:Normal     Neuro/Psych negative neurological ROS     GI/Hepatic Neg liver ROS, GERD  ,  Endo/Other  negative endocrine ROS  Renal/GU negative Renal ROS     Musculoskeletal  (+) Arthritis ,   Abdominal   Peds  Hematology negative hematology ROS (+)   Anesthesia Other Findings   Reproductive/Obstetrics                             Lab Results  Component Value Date   WBC 11.2 (H) 10/21/2020   HGB 13.1 10/21/2020   HCT 38.7 10/21/2020   MCV 99.0 10/21/2020   PLT 261 10/21/2020   Lab Results  Component Value Date   CREATININE 0.90 10/21/2020   BUN 9 10/21/2020   NA 134 (L) 10/21/2020   K 4.3 10/21/2020   CL 102 10/21/2020   CO2 23 10/21/2020    Anesthesia Physical Anesthesia Plan  ASA: II  Anesthesia Plan: General   Post-op Pain Management:    Induction: Intravenous  PONV Risk Score and Plan: 2 and Dexamethasone, Ondansetron and Treatment may vary due to age or medical condition  Airway Management Planned: Oral ETT  Additional Equipment:   Intra-op Plan:   Post-operative Plan: Extubation in OR  Informed Consent: I have reviewed the patients History and Physical, chart, labs and discussed the procedure including the risks, benefits and alternatives for the proposed anesthesia with the patient or authorized representative who has indicated his/her understanding and acceptance.     Dental advisory given  Plan Discussed with:  CRNA  Anesthesia Plan Comments:         Anesthesia Quick Evaluation

## 2020-10-24 NOTE — H&P (Signed)
Lynn Black is an 60 y.o. female.   Chief Complaint: Neck shoulder arm pain with hand numbness and weakness. HPI: Lynn Black is a 60 year old individual whose had significant cervical spondylitic disease in the past years ago she underwent an anterior decompression arthrodesis at C6-C7 she subsequently developed disease at C4-5 and C5-6.  She had decompression of this in 2019 since that time she has developed further numbness tingling and dysesthesias in the upper extremities now with atrophy in the small hand muscles.  EMG nerve conduction study suggests only mild evidence for carpal tunnel disease and I suspect most of her symptoms are related to C8 radiculopathy which was evident.  She has advanced spondylitic disease with foraminal stenosis at the C7-T1 level but she also has advanced degenerative changes at C3-4 with cord compression.  After careful consideration of her options I advised surgical decompression of C3-4 and C7-T1.  Past Medical History:  Diagnosis Date  . Eczema   . GERD (gastroesophageal reflux disease)   . Heart murmur    " nothing to wory about"  . Pruritus   . Psoriasis   . Seasonal allergies   . Smoking   . Vitamin B12 deficiency 05/2019  . Vitamin D deficiency 05/2019    Past Surgical History:  Procedure Laterality Date  . ANTERIOR CERVICAL DECOMP/DISCECTOMY FUSION N/A 10/26/2016   Procedure: Cervical four-five, Cervical five-six  Anterior cervical decompression/discectomy/fusion;  Surgeon: Barnett Abu, MD;  Location: [facility] OR;  Service: Neurosurgery;  Laterality: N/A;  . CERVICAL DISCECTOMY    . ROTATOR CUFF REPAIR Right     Family History  Family history unknown: Yes   Social History:  reports that she has been smoking cigarettes. She has a 15.00 pack-year smoking history. She has never used smokeless tobacco. She reports current alcohol use of about 6.0 standard drinks of alcohol per week. She reports that she does not use drugs.  Allergies:  Allergies   Allergen Reactions  . Nickel Rash  . Shellfish Allergy     Tested positive for shellfish allergy, unknown reaction  . Sulfa Antibiotics Other (See Comments)    Tested positive for sulfa allergy, unknown reaction  . Other Hives    [place] dressing   . Contrast Media [Iodinated Diagnostic Agents] Swelling    Facial swelling   . Onion Hives  . Tomato Hives  . Latex Hives and Rash  . Neosporin [Neomycin-Bacitracin Zn-Polymyx] Rash    Medications Prior to Admission  Medication Sig Dispense Refill  . clobetasol ointment (TEMOVATE) 0.05 % Apply 1 application topically 2 (two) times daily as needed for rash.    . gabapentin (NEURONTIN) 300 MG capsule Take 300 mg by mouth at bedtime as needed for pain.    . traZODone (DESYREL) 50 MG tablet Take 0.5-1 tablets (25-50 mg total) by mouth at bedtime as needed for sleep. (Patient taking differently: Take 50 mg by mouth at bedtime.) 30 tablet 11  . triamcinolone (KENALOG) 0.1 % APPLY 1 APPLICATION TOPICALLY 2 TIMES DAILY. (Patient taking differently: Apply 1 application topically 2 (two) times daily as needed (rash).) 454 g 2    No results found for this or any previous visit (from the past 48 hour(s)). No results found.  Review of Systems  Constitutional: Negative.   HENT: Negative.   Eyes: Negative.   Respiratory: Negative.   Cardiovascular: Negative.   Gastrointestinal: Negative.   Endocrine: Negative.   Genitourinary: Negative.   Musculoskeletal: Positive for neck pain and neck stiffness.  Allergic/Immunologic: Negative.  Neurological: Positive for weakness and numbness.  Hematological: Negative.   Psychiatric/Behavioral: Negative.     There were no vitals taken for this visit. Physical Exam Constitutional:      Appearance: Normal appearance.  HENT:     Head: Normocephalic and atraumatic.     Nose: Nose normal.     Mouth/Throat:     Mouth: Mucous membranes are moist.  Eyes:     Extraocular Movements: Extraocular movements  intact.     Pupils: Pupils are equal, round, and reactive to light.  Neck:     Comments: Decreased range of motion turning 30 degrees left and right flexion extension limited to less than 50% of normal. Cardiovascular:     Rate and Rhythm: Normal rate and regular rhythm.     Pulses: Normal pulses.     Heart sounds: Normal heart sounds.  Pulmonary:     Effort: Pulmonary effort is normal.     Breath sounds: Normal breath sounds.  Abdominal:     General: Abdomen is flat.     Palpations: Abdomen is soft.  Musculoskeletal:     Comments: Straight leg lifts negative to 80 degrees Patrick's maneuver is negative bilaterally tone and bulk are normal  Skin:    General: Skin is warm and dry.     Capillary Refill: Capillary refill takes less than 2 seconds.  Neurological:     Mental Status: She is alert.     Comments: Cranial nerve examination is normal.  Upper extremity strength reveals weakness and atrophy in the intrinsic muscles of both hands.  Numbness is noted with decreased sensation in the region of the deltoid bicep the radial aspect of the hands bilaterally.  Positive Tinel's sign is noted at the wrists.  Negative triceps reflexes noted negative biceps reflex is noted.  Lower extremity strength is normal Station and gait are normal  Psychiatric:        Mood and Affect: Mood normal.        Behavior: Behavior normal.        Thought Content: Thought content normal.        Judgment: Judgment normal.      Assessment/Plan Spondylosis with myelopathy and radiculopathy C3-4 C7-T1.  Plan anterior cervical decompression C3-4 C7-T1.  Stefani Dama, MD 10/24/2020, 10:02 AM

## 2020-10-24 NOTE — Anesthesia Procedure Notes (Signed)
Procedure Name: Intubation Date/Time: 10/24/2020 12:29 PM Performed by: Epifanio Lesches, CRNA Pre-anesthesia Checklist: Patient identified, Emergency Drugs available, Suction available and Patient being monitored Patient Re-evaluated:Patient Re-evaluated prior to induction Oxygen Delivery Method: Circle System Utilized Preoxygenation: Pre-oxygenation with 100% oxygen Induction Type: IV induction Ventilation: Mask ventilation without difficulty and Oral airway inserted - appropriate to patient size Laryngoscope Size: Glidescope and 3 Grade View: Grade I Tube type: Oral Tube size: 7.0 mm Number of attempts: 1 Airway Equipment and Method: Stylet and Oral airway Placement Confirmation: ETT inserted through vocal cords under direct vision,  positive ETCO2 and breath sounds checked- equal and bilateral Secured at: 22 cm Tube secured with: Tape Dental Injury: Teeth and Oropharynx as per pre-operative assessment

## 2020-10-24 NOTE — Transfer of Care (Signed)
Immediate Anesthesia Transfer of Care Note  Patient: Lynn Black  Procedure(s) Performed: Cervical three-four, Cervical seven-Thoracic one Anterior cervical decompression/discectomy fusion (N/A Spine Cervical)  Patient Location: PACU  Anesthesia Type:General  Level of Consciousness: awake, alert  and patient cooperative  Airway & Oxygen Therapy: Patient Spontanous Breathing and Patient connected to face mask oxygen  Post-op Assessment: Report given to RN, Post -op Vital signs reviewed and stable and Patient moving all extremities X 4  Post vital signs: Reviewed and stable  Last Vitals:  Vitals Value Taken Time  BP 155/74 10/24/20 1502  Temp    Pulse 95 10/24/20 1504  Resp 15 10/24/20 1504  SpO2 99 % 10/24/20 1504  Vitals shown include unvalidated device data.  Last Pain:  Vitals:   10/24/20 1036  TempSrc:   PainSc: 0-No pain         Complications: No complications documented.

## 2020-10-25 ENCOUNTER — Encounter (HOSPITAL_COMMUNITY): Payer: Self-pay | Admitting: Neurological Surgery

## 2020-10-25 DIAGNOSIS — M4722 Other spondylosis with radiculopathy, cervical region: Secondary | ICD-10-CM | POA: Diagnosis not present

## 2020-10-25 MED ORDER — OXYCODONE-ACETAMINOPHEN 5-325 MG PO TABS: 1.0000 | ORAL_TABLET | ORAL | 0 refills | Status: DC | PRN

## 2020-10-25 MED ORDER — METHOCARBAMOL 500 MG PO TABS: 500.0000 mg | ORAL_TABLET | Freq: Four times a day (QID) | ORAL | 3 refills | Status: DC | PRN

## 2020-10-25 NOTE — Discharge Summary (Signed)
Physician Discharge Summary  Patient ID: Lynn Black MRN: [id number] DOB/AGE: 59/21/1962 59 y.o.  Admit date: 10/24/2020 Discharge date: 10/25/2020  Admission Diagnoses: Cervical spondylosis with myelopathy and radiculopathy C3-4 C7-T1  Discharge Diagnoses: Cervical spondylosis with myelopathy and radiculopathy C3-4 C7-T1 history of previous arthrodesis C4-C7 Active Problems:   Cervical spondylosis with myelopathy and radiculopathy   Discharged Condition: good  Hospital Course: Patient was admitted to undergo surgical decompression and stabilization C3-4 and C7-T1.  She tolerated surgery well  Consults: None  Significant Diagnostic Studies: None  Treatments: surgery: See op note  Discharge Exam: Blood pressure 135/70, pulse 63, temperature 97.6 F (36.4 C), temperature source Oral, resp. rate 16, height 5\' 2"  (1.575 m), weight 51.7 kg, SpO2 98 %. Incision is clean and dry Station and gait is intact upper extremity strength is normal  Disposition: Discharge disposition: 01-Home or Self Care       Discharge Instructions    Call MD for:  redness, tenderness, or signs of infection (pain, swelling, redness, odor or green/yellow discharge around incision site)   Complete by: As directed    Call MD for:  severe uncontrolled pain   Complete by: As directed    Call MD for:  temperature >100.4   Complete by: As directed    Diet - low sodium heart healthy   Complete by: As directed    Discharge wound care:   Complete by: As directed    Okay to shower. Do not apply salves or appointments to incision. No heavy lifting with the upper extremities greater than 10 pounds. May resume driving when not requiring pain medication and patient feels comfortable with doing so.  To remain out of work from date of surgery for 4 weeks.   Incentive spirometry RT   Complete by: As directed    Increase activity slowly   Complete by: As directed      Allergies as of 10/25/2020      Reactions    Nickel Rash   Shellfish Allergy    Tested positive for shellfish allergy, unknown reaction   Sulfa Antibiotics Other (See Comments)   Tested positive for sulfa allergy, unknown reaction   Other Hives   10/27/2020 dressing    Contrast Media [iodinated Diagnostic Agents] Swelling   Facial swelling    Onion Hives   Tomato Hives   Latex Hives, Rash   Neosporin [neomycin-bacitracin Zn-polymyx] Rash      Medication List    TAKE these medications   clobetasol ointment 0.05 % Commonly known as: TEMOVATE Apply 1 application topically 2 (two) times daily as needed for rash.   gabapentin 300 MG capsule Commonly known as: NEURONTIN Take 300 mg by mouth at bedtime as needed for pain.   methocarbamol 500 MG tablet Commonly known as: ROBAXIN Take 1 tablet (500 mg total) by mouth every 6 (six) hours as needed for muscle spasms.   oxyCODONE-acetaminophen 5-325 MG tablet Commonly known as: PERCOCET/ROXICET Take 1-2 tablets by mouth every 4 (four) hours as needed for moderate pain or severe pain.   traZODone 50 MG tablet Commonly known as: DESYREL Take 0.5-1 tablets (25-50 mg total) by mouth at bedtime as needed for sleep. What changed:   how much to take  when to take this   triamcinolone cream 0.1 % Commonly known as: KENALOG APPLY 1 APPLICATION TOPICALLY 2 TIMES DAILY. What changed:   how much to take  how to take this  when to take this  reasons to take this  additional instructions            Discharge Care Instructions  (From admission, onward)         Start     Ordered   10/25/20 0000  Discharge wound care:       Comments: Okay to shower. Do not apply salves or appointments to incision. No heavy lifting with the upper extremities greater than 10 pounds. May resume driving when not requiring pain medication and patient feels comfortable with doing so.  To remain out of work from date of surgery for 4 weeks.   10/25/20 4315           Signed: Shary Key  Yuko Coventry 10/25/2020, 8:28 AM

## 2020-10-25 NOTE — Progress Notes (Signed)
Occupational Therapy Evaluation Patient Details Name: Lynn Black MRN: [id number] DOB: 15-Jan-1961 Today's Date: 10/25/2020    History of Present Illness The pt is a 60 yo female presenting 5/19 for ACDF C3-4 and C7-T1 with removal of previously placed plate at B0-1 due to ongoing myelopathy and arm pain. PMH includes multiple prior cervical surgeries to include fusion of C4-7.   Clinical Impression   Domingue was evaluated s/p the above cervical sx, she is doing well and eager to go home today. PTA pt was indep in all ADL/IADLs including working and driving. Pt verbalized good understanding of all back precautions. Pt completed ADLs with supervision/min A for compensatory strategies to maintain back precautions. Pt demonstrated great ability to use figure 4 position for lower body dressing. Spent increased time going over body mechanics and proper ergonomics for her work to maintain back precautions. Pt does not require further skilled OT services. Recommend d/c to home with supervision for all ADLs and mobility initially.     Follow Up Recommendations  No OT follow up;Supervision - Intermittent    Equipment Recommendations  None recommended by OT       Precautions / Restrictions Precautions Precautions: Cervical Precaution Booklet Issued: Yes (comment) Precaution Comments: verbally reviewed back precuations, back handout located in room Required Braces or Orthoses: Cervical Brace Cervical Brace: Soft collar;For comfort;Other (comment) (may remove when in bed and quick trips to bathroom from bed) Restrictions Weight Bearing Restrictions: No      Mobility Bed Mobility Overal bed mobility: Modified Independent             General bed mobility comments: good ability to log roll    Transfers Overall transfer level: Modified independent Equipment used: None             General transfer comment: pt able to complete without UE support or evidence of instability    Balance  Overall balance assessment: Needs assistance Sitting-balance support: No upper extremity supported Sitting balance-Leahy Scale: Good     Standing balance support: No upper extremity supported Standing balance-Leahy Scale: Fair           ADL either performed or assessed with clinical judgement   ADL Overall ADL's : Needs assistance/impaired Eating/Feeding: Independent;Sitting   Grooming: Wash/dry hands;Wash/dry face;Oral care;Brushing hair;Modified independent;Cueing for compensatory techniques   Upper Body Bathing: Supervision/ safety;Sitting   Lower Body Bathing: Supervison/ safety;Sit to/from stand   Upper Body Dressing : Modified independent;Cueing for compensatory techniques   Lower Body Dressing: Minimal assistance;Cueing for compensatory techniques   Toilet Transfer: Supervision/safety;Ambulation;Comfort height toilet   Toileting- Clothing Manipulation and Hygiene: Supervision/safety;Sitting/lateral lean       Functional mobility during ADLs: Supervision/safety General ADL Comments: compensatory techniques for ADLs to maintain back precautions     Pertinent Vitals/Pain Pain Assessment: Faces Faces Pain Scale: Hurts a little bit Pain Location: incision site Pain Descriptors / Indicators: Discomfort Pain Intervention(s): Limited activity within patient's tolerance;Monitored during session     Hand Dominance Right   Extremity/Trunk Assessment Upper Extremity Assessment Upper Extremity Assessment: Overall WFL for tasks assessed   Lower Extremity Assessment Lower Extremity Assessment: Defer to PT evaluation   Cervical / Trunk Assessment Cervical / Trunk Assessment: Other exceptions Cervical / Trunk Exceptions: s/p cervical surgery   Communication Communication Communication: No difficulties   Cognition Arousal/Alertness: Awake/alert Behavior During Therapy: WFL for tasks assessed/performed Overall Cognitive Status: Within Functional Limits for tasks  assessed  General Comments  VSS on RA      Home Living Family/patient expects to be discharged to:: Private residence Living Arrangements: Non-relatives/Friends Available Help at Discharge: Friend(s);Available PRN/intermittently Type of Home: House Home Access: Stairs to enter [employer] of Steps: 2 Entrance Stairs-Rails: Left Home Layout: One level     Bathroom Shower/Tub: Producer, television/film/video: Standard     Home Equipment: Shower seat;Grab bars - toilet;Grab bars - tub/shower;Hand held shower head          Prior Functioning/Environment Level of Independence: Independent        Comments: works Estate manager/land agent frames, does Pharmacologist at [employer], drives        OT Problem List: Decreased range of motion;Decreased activity tolerance;Decreased safety awareness;Decreased knowledge of use of DME or AE;Decreased knowledge of precautions;Pain      OT Treatment/Interventions:      OT Goals(Current goals can be found in the care plan section) Acute Rehab OT Goals Patient Stated Goal: return to work   AM-PAC OT "6 Clicks" Daily Activity     Outcome Measure Help from another person eating meals?: None Help from another person taking care of personal grooming?: A Little Help from another person toileting, which includes using toliet, bedpan, or urinal?: A Little Help from another person bathing (including washing, rinsing, drying)?: A Little Help from another person to put on and taking off regular upper body clothing?: A Little Help from another person to put on and taking off regular lower body clothing?: A Little 6 Click Score: 19   End of Session Nurse Communication: Mobility status  Activity Tolerance: Patient tolerated treatment well Patient left: in bed  OT Visit Diagnosis: Muscle weakness (generalized) (M62.81);Pain Pain - Right/Left:  (neck) Pain - part of body:  (neck)                Time: 3662-9476 OT Time  Calculation (min): 20 min Charges:  OT General Charges $OT Visit: 1 Visit OT Evaluation $OT Eval Low Complexity: 1 Low    Raykwon Hobbs A Vernis Eid 10/25/2020, 8:53 AM

## 2020-10-25 NOTE — Anesthesia Postprocedure Evaluation (Signed)
Anesthesia Post Note  Patient: Lynn Black  Procedure(s) Performed: Cervical three-four, Cervical seven-Thoracic one Anterior cervical decompression/discectomy fusion (N/A Spine Cervical)     Patient location during evaluation: PACU Anesthesia Type: General Level of consciousness: awake and alert Pain management: pain level controlled Vital Signs Assessment: post-procedure vital signs reviewed and stable Respiratory status: spontaneous breathing, nonlabored ventilation, respiratory function stable and patient connected to nasal cannula oxygen Cardiovascular status: blood pressure returned to baseline and stable Postop Assessment: no apparent nausea or vomiting Anesthetic complications: no   No complications documented.  Last Vitals:  Vitals:   10/25/20 0427 10/25/20 0744  BP: (!) 155/67 135/70  Pulse: 69 63  Resp: 18 16  Temp: 36.6 C 36.4 C  SpO2: 98% 98%    Last Pain:  Vitals:   10/25/20 0808  TempSrc:   PainSc: 3                  Kennieth Rad

## 2020-10-25 NOTE — Evaluation (Signed)
Physical Therapy Evaluation Patient Details Name: Lynn Black MRN: [id number] DOB: 05/25/1961 Today's Date: 10/25/2020   History of Present Illness  The pt is a 60 yo female presenting 5/19 for ACDF C3-4 and C7-T1 with removal of previously placed plate at F6-4 due to ongoing myelopathy and arm pain. PMH includes multiple prior cervical surgeries to include fusion of C4-7.    Clinical Impression  Pt in bed upon arrival of PT, agreeable to evaluation at this time. Prior to admission the pt was independent with all mobility without AD, reports she was still working and driving without issue, independent with all ADLs and IADLs. The pt now presents with minor limitations in functional mobility and dynamic stability due to above dx, but is safe to return home with intermittent assist from friends. The pt was able to demo good independence with all transfers and initially hallway ambulation without need for AD or assist. The pt also was able to complete stair training without physical assist or evidence of instability. All education regarding cervical precautions, modifications to work environment, and fall risk reduction provided and pt verbalized understanding. No further acute PT needs, thank you for the consult.     Follow Up Recommendations No PT follow up;Supervision - Intermittent    Equipment Recommendations  None recommended by PT    Recommendations for Other Services       Precautions / Restrictions Precautions Precautions: Cervical Precaution Booklet Issued: Yes (comment) Precaution Comments: discussed verbally, requires cues for application to mobility Required Braces or Orthoses: Cervical Brace Cervical Brace: Soft collar;For comfort Restrictions Weight Bearing Restrictions: No      Mobility  Bed Mobility Overal bed mobility: Modified Independent             General bed mobility comments: pt able to demo log roll from supine to sit, needed cueing to perform sit to  supine    Transfers Overall transfer level: Modified independent Equipment used: None             General transfer comment: pt able to complete without UE support or evidence of instability  Ambulation/Gait Ambulation/Gait assistance: Min guard Gait Distance (Feet): 250 Feet Assistive device: None Gait Pattern/deviations: Step-through pattern;Decreased stride length Gait velocity: 1.0 m/s Gait velocity interpretation: 1.31 - 2.62 ft/sec, indicative of limited community ambulator General Gait Details: pt with short strides but generally functional gait without LOB or instability  Stairs Stairs: Yes Stairs assistance: Min guard Stair Management: One rail Left;Step to pattern Number of Stairs: 2 General stair comments: good stability with single rail.     Balance Overall balance assessment: Mild deficits observed, not formally tested                                           Pertinent Vitals/Pain Pain Assessment: Faces Faces Pain Scale: Hurts a little bit Pain Location: incision site Pain Descriptors / Indicators: Discomfort Pain Intervention(s): Limited activity within patient's tolerance;Monitored during session;Repositioned    Home Living Family/patient expects to be discharged to:: Private residence Living Arrangements: Non-relatives/Friends Available Help at Discharge: Friend(s);Available PRN/intermittently Type of Home: House Home Access: Stairs to enter Entrance Stairs-Rails: Left Entrance Stairs-Number of Steps: 2 Home Layout: One level Home Equipment: Shower seat;Grab bars - toilet;Grab bars - tub/shower;Hand held shower head      Prior Function Level of Independence: Independent  Comments: still working making frames, driving, completely independent without need for AD     Hand Dominance   Dominant Hand: Right    Extremity/Trunk Assessment   Upper Extremity Assessment Upper Extremity Assessment: Defer to OT  evaluation;Overall [facility] for tasks assessed    Lower Extremity Assessment Lower Extremity Assessment: Overall WFL for tasks assessed    Cervical / Trunk Assessment Cervical / Trunk Assessment: Other exceptions Cervical / Trunk Exceptions: s/p cervical surgery  Communication   Communication: No difficulties  Cognition Arousal/Alertness: Awake/alert Behavior During Therapy: WFL for tasks assessed/performed Overall Cognitive Status: Within Functional Limits for tasks assessed                                        General Comments General comments (skin integrity, edema, etc.): VSS on RA        Assessment/Plan    PT Assessment Patent does not need any further PT services  PT Problem List         PT Treatment Interventions      PT Goals (Current goals can be found in the Care Plan section)  Acute Rehab PT Goals Patient Stated Goal: return to work PT Goal Formulation: With patient Time For Goal Achievement: 11/01/20 Potential to Achieve Goals: Good     AM-PAC PT "6 Clicks" Mobility  Outcome Measure Help needed turning from your back to your side while in a flat bed without using bedrails?: None Help needed moving from lying on your back to sitting on the side of a flat bed without using bedrails?: None Help needed moving to and from a bed to a chair (including a wheelchair)?: None Help needed standing up from a chair using your arms (e.g., wheelchair or bedside chair)?: None Help needed to walk in hospital room?: None Help needed climbing 3-5 steps with a railing? : A Little 6 Click Score: 23    End of Session Equipment Utilized During Treatment: Cervical collar Activity Tolerance: Patient tolerated treatment well;No increased pain Patient left: in bed;with call bell/phone within reach Nurse Communication: Mobility status PT Visit Diagnosis: Unsteadiness on feet (R26.81)    Time: 9798-9211 PT Time Calculation (min) (ACUTE ONLY): 16  min   Charges:   PT Evaluation $PT Eval Low Complexity: 1 Low          Rolm Baptise, PT, DPT   Acute Rehabilitation Department Pager #: [phone]  Gaetana Michaelis 10/25/2020, 8:24 AM

## 2020-10-25 NOTE — Progress Notes (Signed)
Patient is discharged from room 3C07 at this time. Alert and in stable condition. IV site d/c'd and instructions read to patient with understanding verbalized and all questions answered. Left unit via wheelchair with all belongings at side. 

## 2020-10-25 NOTE — Discharge Instructions (Signed)

## 2021-01-10 ENCOUNTER — Encounter (HOSPITAL_COMMUNITY): Payer: Self-pay

## 2021-01-10 ENCOUNTER — Ambulatory Visit (HOSPITAL_COMMUNITY)
Admission: EM | Admit: 2021-01-10 | Discharge: 2021-01-10 | Disposition: A | Discharge status: Home / Self Care | Payer: Medicaid Other | Attending: Medical Oncology | Admitting: Medical Oncology

## 2021-01-10 ENCOUNTER — Other Ambulatory Visit: Payer: Self-pay

## 2021-01-10 DIAGNOSIS — L509 Urticaria, unspecified: Secondary | ICD-10-CM | POA: Diagnosis not present

## 2021-01-10 MED ORDER — METHYLPREDNISOLONE SODIUM SUCC 125 MG IJ SOLR
INTRAMUSCULAR | Status: AC
  Filled 2021-01-10: qty 2

## 2021-01-10 MED ORDER — FAMOTIDINE 20 MG PO TABS: 20.0000 mg | ORAL_TABLET | Freq: Two times a day (BID) | ORAL | 0 refills | Status: DC

## 2021-01-10 MED ORDER — CETIRIZINE HCL 10 MG PO TABS: 10.0000 mg | ORAL_TABLET | Freq: Every day | ORAL | 0 refills | Status: DC

## 2021-01-10 MED ORDER — METHYLPREDNISOLONE SODIUM SUCC 125 MG IJ SOLR
125.0000 mg | Freq: Once | INTRAMUSCULAR | Status: AC
  Administered 2021-01-10: 125 mg via INTRAMUSCULAR

## 2021-01-10 MED ORDER — PREDNISONE 10 MG (21) PO TBPK: ORAL_TABLET | Freq: Every day | ORAL | 0 refills | Status: DC

## 2021-01-10 MED ORDER — FAMOTIDINE 20 MG PO TABS
ORAL_TABLET | ORAL | Status: AC
  Filled 2021-01-10: qty 1

## 2021-01-10 MED ORDER — FAMOTIDINE 20 MG PO TABS
20.0000 mg | ORAL_TABLET | Freq: Every day | ORAL | Status: DC
  Administered 2021-01-10: 20 mg via ORAL

## 2021-01-10 NOTE — ED Provider Notes (Signed)
[facility]    CSN: [id number] Arrival date & time: 01/10/21  1539      History   Chief Complaint Chief Complaint  Patient presents with   Rash    HPI Lynn Black is a 60 y.o. female.   HPI  Rash: Pt reports that over the past 2 days she has had an itchy rash of her body. Mainly on underarms, arms, hands, legs. Rash is very itchy. She has tried OTC topical steroid without much improvement. No new medications or products. She does have a history of various allergies but does not recall a new exposure. No sick contacts. No fevers, trouble breathing or swallowing.   Past Medical History:  Diagnosis Date   Eczema    GERD (gastroesophageal reflux disease)    Heart murmur    " nothing to wory about"   Pruritus    Psoriasis    Seasonal allergies    Smoking    Vitamin B12 deficiency 05/2019   Vitamin D deficiency 05/2019    Patient Active Problem List   Diagnosis Date Noted   Cervical spondylosis with myelopathy and radiculopathy 10/24/2020   Hypertension 09/12/2018   Tobacco dependence 10/15/2017   Pruritus 10/15/2017   Neuropathic pain 10/15/2017   Psoriasis 03/16/2017   Cervical arthritis with myelopathy 10/26/2016    Past Surgical History:  Procedure Laterality Date   ANTERIOR CERVICAL DECOMP/DISCECTOMY FUSION N/A 10/26/2016   Procedure: Cervical four-five, Cervical five-six  Anterior cervical decompression/discectomy/fusion;  Surgeon: Barnett Abu, MD;  Location: [facility] OR;  Service: Neurosurgery;  Laterality: N/A;   ANTERIOR CERVICAL DECOMP/DISCECTOMY FUSION N/A 10/24/2020   Procedure: Cervical three-four, Cervical seven-Thoracic one Anterior cervical decompression/discectomy fusion;  Surgeon: Barnett Abu, MD;  Location: [facility] OR;  Service: Neurosurgery;  Laterality: N/A;   CERVICAL DISCECTOMY     ROTATOR CUFF REPAIR Right     OB History     Gravida  1   Para  0   Term  0   Preterm  0   AB  0   Living         SAB  0   IAB  0   Ectopic   0   Multiple      Live Births               Home Medications    Prior to Admission medications   Medication Sig Start Date End Date Taking? Authorizing Provider  clobetasol ointment (TEMOVATE) 0.05 % Apply 1 application topically 2 (two) times daily as needed for rash. 09/18/20   [provider]  gabapentin (NEURONTIN) 300 MG capsule Take 300 mg by mouth at bedtime as needed for pain. 09/10/20   [provider]  methocarbamol (ROBAXIN) 500 MG tablet Take 1 tablet (500 mg total) by mouth every 6 (six) hours as needed for muscle spasms. 10/25/20   Barnett Abu, MD  oxyCODONE-acetaminophen (PERCOCET/ROXICET) 5-325 MG tablet Take 1-2 tablets by mouth every 4 (four) hours as needed for moderate pain or severe pain. 10/25/20   Barnett Abu, MD  traZODone (DESYREL) 50 MG tablet Take 0.5-1 tablets (25-50 mg total) by mouth at bedtime as needed for sleep. Patient taking differently: Take 50 mg by mouth at bedtime. 08/21/20   Kallie Locks, FNP  triamcinolone (KENALOG) 0.1 % APPLY 1 APPLICATION TOPICALLY 2 TIMES DAILY. Patient taking differently: Apply 1 application topically 2 (two) times daily as needed (rash). 08/21/20   Kallie Locks, FNP    Family History Family  History  Family history unknown: Yes    Social History Social History   Tobacco Use   Smoking status: Every Day    Packs/day: 0.50    Years: 30.00    Pack years: 15.00    Types: Cigarettes   Smokeless tobacco: Never  Vaping Use   Vaping Use: Never used  Substance Use Topics   Alcohol use: Yes    Alcohol/week: 6.0 standard drinks    Types: 6 Glasses of wine per week   Drug use: No     Allergies   Nickel, Shellfish allergy, Sulfa antibiotics, Other, Contrast media [iodinated diagnostic agents], Onion, Tomato, Latex, and Neosporin [neomycin-bacitracin zn-polymyx]   Review of Systems Review of Systems  As stated above in HPI Physical Exam Triage Vital Signs ED Triage Vitals [01/10/21  1613]  Enc Vitals Group     BP (!) 145/83     Pulse Rate 92     Resp 20     Temp 98.3 F (36.8 C)     Temp Source Oral     SpO2 98 %     Weight      Height      Head Circumference      Peak Flow      Pain Score 0     Pain Loc      Pain Edu?      Excl. in GC?    No data found.  Updated Vital Signs BP (!) 145/83 (BP Location: Right Arm)   Pulse 92   Temp 98.3 F (36.8 C) (Oral)   Resp 20   SpO2 98%   Physical Exam Vitals and nursing note reviewed.  Constitutional:      General: She is not in acute distress.    Appearance: Normal appearance. She is not ill-appearing, toxic-appearing or diaphoretic.  HENT:     Mouth/Throat:     Pharynx: Oropharynx is clear.  Cardiovascular:     Rate and Rhythm: Normal rate and regular rhythm.     Heart sounds: Normal heart sounds.  Pulmonary:     Effort: Pulmonary effort is normal.     Breath sounds: Normal breath sounds.  Skin:    Comments: See below  Neurological:     Mental Status: She is alert.              UC Treatments / Results  Labs (all labs ordered are listed, but only abnormal results are displayed) Labs Reviewed - No data to display  EKG   Radiology No results found.  Procedures Procedures (including critical care time)  Medications Ordered in UC Medications - No data to display  Initial Impression / Assessment and Plan / UC Course  I have reviewed the triage vital signs and the nursing notes.  Pertinent labs & imaging results that were available during my care of the patient were reviewed by me and considered in my medical decision making (see chart for details).     New. Appears allergic in nature- does not look like scabies at this time. Treating with solu-medrol, pepcid here and sending her home with zyrtec, pepcid and steroid to start tomorrow.  Final Clinical Impressions(s) / UC Diagnoses   Final diagnoses:  None   Discharge Instructions   None    ED Prescriptions   None     PDMP not reviewed this encounter.   Rushie Chestnut, [place] 01/10/21 1654

## 2021-01-10 NOTE — ED Triage Notes (Signed)
Pt presents with c/o a rash under her arms, on her knees, hands and arms.

## 2021-01-28 ENCOUNTER — Encounter (INDEPENDENT_AMBULATORY_CARE_PROVIDER_SITE_OTHER): Payer: BLUE CROSS/BLUE SHIELD | Admitting: Family Medicine

## 2021-02-04 ENCOUNTER — Encounter (INDEPENDENT_AMBULATORY_CARE_PROVIDER_SITE_OTHER): Payer: Self-pay | Admitting: Family Medicine

## 2021-02-04 ENCOUNTER — Ambulatory Visit (INDEPENDENT_AMBULATORY_CARE_PROVIDER_SITE_OTHER): Payer: BLUE CROSS/BLUE SHIELD | Admitting: Family Medicine

## 2021-02-04 VITALS — BP 130/84 | HR 69 | Temp 98.6°F | Wt 191.0 lb

## 2021-02-04 DIAGNOSIS — R7989 Other specified abnormal findings of blood chemistry: Secondary | ICD-10-CM

## 2021-02-04 DIAGNOSIS — E7800 Pure hypercholesterolemia, unspecified: Secondary | ICD-10-CM

## 2021-02-04 DIAGNOSIS — Z01818 Encounter for other preprocedural examination: Secondary | ICD-10-CM

## 2021-02-04 LAB — COMPREHENSIVE METABOLIC PANEL
ALT: 22 U/L (ref 0–55)
AST (SGOT): 22 U/L (ref 5–34)
Albumin/Globulin Ratio: 1.4 (ref 0.9–2.2)
Albumin: 4.4 g/dL (ref 3.5–5.0)
Alkaline Phosphatase: 88 U/L (ref 37–117)
Anion Gap: 8 (ref 5.0–15.0)
BUN: 11 mg/dL (ref 7.0–19.0)
Bilirubin, Total: 0.8 mg/dL (ref 0.2–1.2)
CO2: 27 mEq/L (ref 21–29)
Calcium: 9.9 mg/dL (ref 8.5–10.5)
Chloride: 103 mEq/L (ref 100–111)
Creatinine: 0.9 mg/dL (ref 0.4–1.5)
Globulin: 3.2 g/dL (ref 2.0–3.6)
Glucose: 84 mg/dL (ref 70–100)
Potassium: 4.3 mEq/L (ref 3.5–5.1)
Protein, Total: 7.6 g/dL (ref 6.0–8.3)
Sodium: 138 mEq/L (ref 136–145)

## 2021-02-04 LAB — URINALYSIS, REFLEX TO MICROSCOPIC EXAM IF INDICATED
Bilirubin, UA: NEGATIVE
Blood, UA: NEGATIVE
Glucose, UA: NEGATIVE
Ketones UA: NEGATIVE
Nitrite, UA: POSITIVE — AB
Protein, UR: NEGATIVE
Specific Gravity UA: 1.006 (ref 1.001–1.035)
Urine pH: 6.5 (ref 5.0–8.0)
Urobilinogen, UA: NORMAL mg/dL

## 2021-02-04 LAB — ECG 12-LEAD
Atrial Rate: 54 {beats}/min
P Axis: 51 degrees
P-R Interval: 166 ms
Q-T Interval: 470 ms
QRS Duration: 78 ms
QTC Calculation (Bezet): 445 ms
R Axis: 4 degrees
T Axis: 20 degrees
Ventricular Rate: 54 {beats}/min

## 2021-02-04 LAB — LIPID PANEL
Cholesterol / HDL Ratio: 4 Index
Cholesterol: 286 mg/dL — ABNORMAL HIGH (ref 0–199)
HDL: 71 mg/dL (ref 40–9999)
LDL Calculated: 184 mg/dL — ABNORMAL HIGH (ref 0–99)
Triglycerides: 154 mg/dL — ABNORMAL HIGH (ref 34–149)
VLDL Calculated: 31 mg/dL (ref 10–40)

## 2021-02-04 LAB — HEMOLYSIS INDEX: Hemolysis Index: 2 Index (ref 0–24)

## 2021-02-04 LAB — GFR: EGFR: >60

## 2021-02-04 NOTE — Progress Notes (Signed)
[facility] [place] [facility]                       Date of Exam: 02/04/2021 1:51 PM        Patient ID: Melinda Brown is a 60 y.o. female.  Attending Physician: Theresa Mulligan, MD        Chief Complaint:    Chief Complaint   Patient presents with    Pre-op Exam               HPI:    60 year old divorced white female for preoperative medical clearance at the request of Marybelle Killings, M.D.--scheduled for abdominoplasty and liposuction (flanks/knees/thighs) to be performed under general anesthesia 02/26/2021  GYN: [name] Prescription medication: Zolpidem CR 12.5 mg as needed, Phentermine 37.5 mg as needed no known drug allergy No tobacco/daily alcohol Past medical history: Insomnia/restless leg syndrome, creatinine 0.04 greater than reference range Past surgical history: Appendectomy, (benign) breast lumpectomy, (multiple) cosmetic  Cardiac risk factor: Female greater than 55, BMI greater than 30, hyperlipidemia (LDL) family history: No colon cancer Tdap 09/24/2016, Shingrix (02/16/2019, 04/29/2019), Pfizer (09/04/2019, 09/27/19, 04/09/20) Colonoscopy reportedly negative... Mammography 2021 benign            Problem List:    Patient Active Problem List   Diagnosis    Insomnia    Abnormal weight gain    Contact dermatitis    Coronary atherosclerosis due to calcified coronary lesion    Cystitis    Depressive disorder    Encounter for weight loss counseling    Mixed hyperlipidemia    Obesity    Obstructive sleep apnea syndrome    Overweight    Plantar fasciitis    Pure hypercholesterolemia, unspecified    Snoring    Serum creatinine raised             Current Meds:    No outpatient medications have been marked as taking for the 02/04/21 encounter (Office Visit) with Theresa Mulligan, MD.          Allergies:    No Known Allergies          Past Surgical History:    Past Surgical History:   Procedure Laterality Date    APPENDECTOMY (OPEN)  06/08/2001    AUGMENTATION, BREAST, (MEDICAL)  2011     BLEPHAROPLASTY, UPPER & LOWER, (MEDICAL) Bilateral 09/18/2020    Procedure: EYELID LIFT LOWER BILATERAL, EYELID LIFT UPPER WITH BROWPEXY BILATERAL, FACELIFT INCLUDES NECKLIFT, FAT GRAFTING: LIPS AND SMILE LINES (COSMETIC);  Surgeon: Marybelle Killings, MD;  Location: [facility] SURGERY OR;  Service: Plastics;  Laterality: Bilateral;  Eye Lid     COLONOSCOPY  07/10/2011    DERMAL FAT GRAFT (COSMETIC) N/A 09/18/2020    Procedure: FAT GRAFTING: LIPS AND SMILE LINES (COSMETIC) FAT HARVESTED FROM ABDOMINE;  Surgeon: Marybelle Killings, MD;  Location: [facility] SURGERY OR;  Service: Plastics;  Laterality: N/A;  Lip    FACELIFT, (RHYTIDECTOMY) (COSMETIC) Bilateral 09/18/2020    Procedure: FACELIFT INCLUDED NECKLIFT (COSMETIC);  Surgeon: Marybelle Killings, MD;  Location: [facility] SURGERY OR;  Service: Plastics;  Laterality: Bilateral;    VASCULAR SURGERY  10/06/2000    vericose vein stripping           Family History:    Family History   Problem Relation Age of Onset    Heart disease Mother     Asthma Daughter         OLDEST  Social History:    Social History     Tobacco Use    Smoking status: Never    Smokeless tobacco: Never   Vaping Use    Vaping Use: Never used   Substance Use Topics    Alcohol use: Yes     Alcohol/week: 6.0 standard drinks     Types: 6 Standard drinks or equivalent per week    Drug use: Never           The following sections were reviewed this encounter by the provider:            Vital Signs:    BP 130/84    Pulse 69    Temp 98.6 F (37 C)    Wt 86.6 kg (191 lb)    LMP  (LMP Unknown)    SpO2 98%    BMI 30.83 kg/m          ROS:    Review of Systems   Constitutional: Negative.    HENT: Negative.     Eyes: Negative.    Respiratory: Negative.     Cardiovascular: Negative.    Gastrointestinal: Negative.    Endocrine: Negative.    Genitourinary: Negative.    Musculoskeletal: Negative.    Skin: Negative.    Allergic/Immunologic: Negative.    Neurological: Negative.    Hematological: Negative.    All other  systems reviewed and are negative.           Physical Exam:    Physical Exam  Vitals and nursing note reviewed.   Constitutional:       Appearance: Normal appearance. She is obese.   HENT:      Head: Normocephalic and atraumatic.      Right Ear: External ear normal.      Left Ear: External ear normal.   Eyes:      Extraocular Movements: Extraocular movements intact.      Conjunctiva/sclera: Conjunctivae normal.      Pupils: Pupils are equal, round, and reactive to light.   Cardiovascular:      Rate and Rhythm: Normal rate and regular rhythm.      Pulses: Normal pulses.      Heart sounds: Normal heart sounds.   Pulmonary:      Effort: Pulmonary effort is normal.      Breath sounds: Normal breath sounds.   Abdominal:      General: Abdomen is flat. Bowel sounds are normal.      Palpations: Abdomen is soft.   Musculoskeletal:         General: Normal range of motion.      Cervical back: Normal range of motion and neck supple.   Skin:     General: Skin is warm and dry.   Neurological:      General: No focal deficit present.      Mental Status: She is alert and oriented to person, place, and time. Mental status is at baseline.   Psychiatric:         Mood and Affect: Mood normal.         Behavior: Behavior normal.         Thought Content: Thought content normal.         Judgment: Judgment normal.            Assessment:    1. Preop examination  - ECG 12 lead  - CBC and differential  - APTT  - Prothrombin time/INR    2. Pure hypercholesterolemia  -  Lipid panel    3. High creatinine  - Comprehensive metabolic panel          Plan:    See assessment/orders Discussed normal ECG Cleared for surgery Fax preoperative medical clearance form, preoperative medical clearance note addendum, ECG, and lab reports to [phone]            Follow-up:    No follow-ups on file.         Theresa Mulligan, MD

## 2021-02-05 ENCOUNTER — Telehealth (INDEPENDENT_AMBULATORY_CARE_PROVIDER_SITE_OTHER): Payer: Self-pay | Admitting: Family Medicine

## 2021-02-05 LAB — CBC AND DIFFERENTIAL
Absolute NRBC: 0 10*3/uL (ref 0.00–0.00)
Basophils Absolute Automated: 0.05 10*3/uL (ref 0.00–0.08)
Basophils Automated: 1.1 %
Eosinophils Absolute Automated: 0.09 10*3/uL (ref 0.00–0.44)
Eosinophils Automated: 2.1 %
Hematocrit: 43.6 % (ref 34.7–43.7)
Hgb: 14.7 g/dL (ref 11.4–14.8)
Immature Granulocytes Absolute: 0.01 10*3/uL (ref 0.00–0.07)
Immature Granulocytes: 0.2 %
Lymphocytes Absolute Automated: 1.29 10*3/uL (ref 0.42–3.22)
Lymphocytes Automated: 29.7 %
MCH: 30.5 pg (ref 25.1–33.5)
MCHC: 33.7 g/dL (ref 31.5–35.8)
MCV: 90.5 fL (ref 78.0–96.0)
MPV: 11.9 fL (ref 8.9–12.5)
Monocytes Absolute Automated: 0.27 10*3/uL (ref 0.21–0.85)
Monocytes: 6.2 %
Neutrophils Absolute: 2.64 10*3/uL (ref 1.10–6.33)
Neutrophils: 60.7 %
Nucleated RBC: 0 /100 WBC (ref 0.0–0.0)
Platelets: 177 10*3/uL (ref 142–346)
RBC: 4.82 10*6/uL (ref 3.90–5.10)
RDW: 13 % (ref 11–15)
WBC: 4.35 10*3/uL (ref 3.10–9.50)

## 2021-02-05 LAB — PT/INR
PT INR: 1 (ref 0.9–1.1)
PT: 11.5 s (ref 10.1–12.9)

## 2021-02-05 LAB — APTT: PTT: 34 s (ref 27–39)

## 2021-02-05 NOTE — Telephone Encounter (Signed)
Please fax preoperative medical clearance form, preoperative medical clearance note addendum, ECG, and lab reports to Dr. Judith Part at [phone]

## 2021-02-06 ENCOUNTER — Ambulatory Visit: Payer: BLUE CROSS/BLUE SHIELD

## 2021-02-06 NOTE — Telephone Encounter (Signed)
faxed

## 2021-02-07 ENCOUNTER — Ambulatory Visit: Payer: BLUE CROSS/BLUE SHIELD

## 2021-02-07 ENCOUNTER — Other Ambulatory Visit: Payer: Self-pay | Admitting: Internal Medicine

## 2021-02-07 MED ORDER — SCOPOLAMINE 1 MG/3DAYS TD PT72
1.0000 | MEDICATED_PATCH | TRANSDERMAL | 1 refills | Status: DC
Start: 2021-02-07 — End: 2021-02-26

## 2021-02-07 NOTE — PSS Phone Screening (Signed)
Pre-Anesthesia Evaluation    Pre-op phone visit requested by:   Reason for pre-op phone visit: Patient anticipating ABDOMINOPLASTY-EXTENDED WITH LIPOSUCTION TO FLANKS BILATERAL, LIPOSUCTION KNEES BILATERAL, LIPOSUCTION INNER THIGHTS BILATERAL(COSMETIC), BILATERAL INNER THIGHTS LIPOSUCTION(COSMETIC) procedure.    History of Present Illness/Summary:        Problem List:  Medical Problems       Hospital Problem List  Date Reviewed: 02/04/2021   None        Non-Hospital Problem List  Date Reviewed: 02/04/2021            ICD-10-CM Priority Class Noted    Insomnia G47.00   11/11/2012    Abnormal weight gain R63.5   07/11/2015    Contact dermatitis L25.9   01/05/2020    Coronary atherosclerosis due to calcified coronary lesion I25.10, I25.84   08/31/2018    Cystitis N30.90   09/04/2015    Depressive disorder F32.A   01/27/2010    Encounter for weight loss counseling Z71.3   08/04/2015    Mixed hyperlipidemia E78.2   09/09/2015    Obesity E66.9   07/11/2015    Obstructive sleep apnea syndrome G47.33   01/05/2020    Overweight E66.3   08/31/2018    Plantar fasciitis M72.2   01/05/2020    Pure hypercholesterolemia, unspecified E78.00   08/31/2018    Snoring R06.83   08/23/2015    Serum creatinine raised R79.89   09/04/2015        Medical History   Diagnosis Date    Abnormal vision     wears reading glasses    Claustrophobia     History of varicose veins     Post-operative nausea and vomiting     s/p 09/2020 surgery    Seasonal allergic rhinitis      Past Surgical History:   Procedure Laterality Date    APPENDECTOMY (OPEN)  06/08/2001    AUGMENTATION, BREAST, (MEDICAL) Bilateral 2011    BLEPHAROPLASTY, UPPER & LOWER, (MEDICAL) Bilateral 09/18/2020    Procedure: EYELID LIFT LOWER BILATERAL, EYELID LIFT UPPER WITH BROWPEXY BILATERAL, FACELIFT INCLUDES NECKLIFT, FAT GRAFTING: LIPS AND SMILE LINES (COSMETIC);  Surgeon: Marybelle Killings, MD;  Location: [facility] SURGERY OR;  Service: Plastics;  Laterality: Bilateral;  Eye Lid     COLONOSCOPY  07/10/2011     DERMAL FAT GRAFT (COSMETIC) N/A 09/18/2020    Procedure: FAT GRAFTING: LIPS AND SMILE LINES (COSMETIC) FAT HARVESTED FROM ABDOMINE;  Surgeon: Marybelle Killings, MD;  Location: [facility] SUR[facility] OR;  Service: Plastics;  Laterality: N/A;  Lip    FACELIFT, (RHYTIDECTOMY) (COSMETIC) Bilateral 09/18/2020    Procedure: FACELIFT INCLUDED NECKLIFT (COSMETIC);  Surgeon: Marybelle Killings, MD;  Location: [facility] SURGERY OR;  Service: Plastics;  Laterality: Bilateral;    LUMPECTOMY Left 2011    VARICOSE VEIN SURGERY  10/06/2001    varicose vein stripping       Current Outpatient Medications:     zolpidem (AMBIEN CR) 12.5 MG CR tablet, Take 12.5 mg by mouth nightly as needed, Disp: , Rfl:      Medication List            Accurate as of February 07, 2021 11:35 AM. Always use your most recent med list.                zolpidem 12.5 MG CR tablet  Take 12.5 mg by mouth nightly as needed  Commonly known as: AMBIEN CR  Medication Adjustments for Surgery: Take as needed  No Known Allergies  Family History   Problem Relation Age of Onset    Heart disease Mother     Asthma Daughter         OLDEST     Social History     Occupational History    Not on file   Tobacco Use    Smoking status: Never    Smokeless tobacco: Never   Vaping Use    Vaping Use: Never used   Substance and Sexual Activity    Alcohol use: Yes     Alcohol/week: 6.0 standard drinks     Types: 6 Standard drinks or equivalent per week    Drug use: Not Currently    Sexual activity: Not on file       Menstrual History:   LMP / Status  Postmenopausal     No LMP recorded (lmp unknown). Patient is postmenopausal.    Tubal Ligation?  No valid surgical or medical questions entered.             Exam Scores:   SDB score Risk Category: No Risk    PONV score Nausea Risk: VERY SEVERE RISK    MST score MST Score: 0    Allergy score Risk Category: Low Risk    Frailty score         Visit Vitals  Ht 1.676 m (5\' 6" )   Wt 83.9 kg (185 lb)   LMP  (LMP Unknown)   BMI 29.86 kg/m        Recent Labs   CBC (last 180 days) 08/22/20  1447 02/04/21  1459   WBC 5.2 4.35   RBC 4.68 4.82   Hemoglobin 14.7  --    Hgb  --  14.7   Hematocrit 41.2 43.6   MCV 88 90.5   MCH 31.4 30.5   MCHC 35.7 33.7   RDW 12.4 13   Platelets 189 177   MPV  --  11.9   Nucleated RBC  --  0.0   Absolute NRBC  --  0.00     Recent Labs   BMP (last 180 days) 08/22/20  1447 02/04/21  1459   Glucose 83 84   BUN 14 11.0   Creatinine 1.04* 0.9   Sodium 138 138   Potassium 4.0 4.3   Chloride 100 103   CO2 18* 27   Calcium 9.6 9.9   Anion Gap  --  8.0   EGFR  --  >60.0   eGFR 62  --      Recent Labs   Coag Panel (last 180 days) 08/22/20  1447 02/04/21  1459   PTT  --  34   PT 10.7 11.5   PT INR 1.0 1.0     Recent Labs   Other (last 180 days) 08/22/20  1447 02/04/21  1459   TSH 2.390  --    ALT 22 22   AST (SGOT) 26 22   Protein, Total 7.3 7.6

## 2021-02-07 NOTE — Pre-Procedure Instructions (Signed)
Important Instructions Before Your Procedure        Your case is currently scheduled for 02/26/2021 at 8:30 AM with Bitar, Deno Lunger, MD.  Please arrive 2 hours early to your scheduled surgery time.     The date and/or time of your surgery may change.  Your surgeon's office will notify you, up until the business day before surgery, if there is any change to your surgery date or time.  Please don't hesitate to call your surgeon's office directly with any questions.    Your surgery is scheduled at [address], [address], [place] [address].  On the following map, the [facility] is #8.  [email or web address]   Please arrive 2 hours early to your scheduled surgery time.  Parking is located behind the building.  Kindly call [phone] if you are lost or running late on the day of surgery.      Please call Pre-Surgical Services at [phone] with any questions regarding our phone interview today.  You may also find this website helpful: [email or web address]      IMPORTANT You must visit the Preparing for Your Procedure guide link below for additional instructions including fasting guidelines and directions before your procedure. If you received fasting or skin preparation instructions from your surgeon or pre-procedural provider, please follow those specific instructions. The instructions here are general instructions that do not pertain to all patients.  [email or web address]    If the link doesn't open, please copy and paste in your browser    QUESTIONS?  If you have any questions prior to your procedure, please call [phone] and leave a message. This voice mail is monitored between 7am and 3pm Monday through Friday.

## 2021-02-21 ENCOUNTER — Ambulatory Visit: Payer: Medicaid Other | Admitting: Nurse Practitioner

## 2021-02-21 ENCOUNTER — Ambulatory Visit: Payer: Medicaid Other | Admitting: Family Medicine

## 2021-02-26 ENCOUNTER — Ambulatory Visit: Payer: Self-pay | Admitting: Anesthesiology

## 2021-02-26 ENCOUNTER — Encounter: Payer: Self-pay | Admitting: Plastic Surgery

## 2021-02-26 ENCOUNTER — Ambulatory Visit
Admission: RE | Admit: 2021-02-26 | Discharge: 2021-02-26 | Disposition: A | Discharge status: Home / Self Care | Payer: Self-pay | Source: Ambulatory Visit | Attending: Plastic Surgery | Admitting: Plastic Surgery

## 2021-02-26 ENCOUNTER — Encounter
Admission: RE | Disposition: A | Discharge status: Home / Self Care | Payer: Self-pay | Source: Ambulatory Visit | Attending: Plastic Surgery

## 2021-02-26 DIAGNOSIS — Z411 Encounter for cosmetic surgery: Secondary | ICD-10-CM | POA: Insufficient documentation

## 2021-02-26 DIAGNOSIS — I2510 Atherosclerotic heart disease of native coronary artery without angina pectoris: Secondary | ICD-10-CM | POA: Insufficient documentation

## 2021-02-26 DIAGNOSIS — L987 Excessive and redundant skin and subcutaneous tissue: Secondary | ICD-10-CM | POA: Insufficient documentation

## 2021-02-26 DIAGNOSIS — E881 Lipodystrophy, not elsewhere classified: Secondary | ICD-10-CM | POA: Insufficient documentation

## 2021-02-26 DIAGNOSIS — G4733 Obstructive sleep apnea (adult) (pediatric): Secondary | ICD-10-CM | POA: Insufficient documentation

## 2021-02-26 HISTORY — PX: LIPOSUCTION, THIGHS (COSMETIC): SHX4757

## 2021-02-26 HISTORY — PX: ABDOMINOPLASTY, LIPOSUCTION (COSMETIC): SHX3010

## 2021-02-26 SURGERY — ABDOMINOPLASTY, LIPOSUCTION (COSMETIC)
Anesthesia: Anesthesia General | Site: Leg Upper | Laterality: Bilateral | Wound class: Clean

## 2021-02-26 MED ORDER — GLYCOPYRROLATE 0.2 MG/ML IJ SOLN (WRAP)
INTRAMUSCULAR | Status: AC
Start: 2021-02-26 — End: ?
  Filled 2021-02-26: qty 1

## 2021-02-26 MED ORDER — DEXAMETHASONE SODIUM PHOSPHATE 4 MG/ML IJ SOLN
INTRAMUSCULAR | Status: AC
Start: 2021-02-26 — End: ?
  Filled 2021-02-26: qty 1

## 2021-02-26 MED ORDER — OXYCODONE HCL 5 MG PO TABS
ORAL_TABLET | ORAL | Status: AC
Start: 2021-02-26 — End: ?
  Filled 2021-02-26: qty 1

## 2021-02-26 MED ORDER — EPHEDRINE SULFATE 50 MG/ML IJ/IV SOLN (WRAP)
Status: AC
Start: 2021-02-26 — End: ?
  Filled 2021-02-26: qty 1

## 2021-02-26 MED ORDER — GABAPENTIN 100 MG PO CAPS
100.0000 mg | ORAL_CAPSULE | Freq: Three times a day (TID) | ORAL | Status: DC
Start: 2021-02-26 — End: 2021-02-26

## 2021-02-26 MED ORDER — GABAPENTIN 100 MG PO CAPS
ORAL_CAPSULE | ORAL | Status: AC
Start: 2021-02-26 — End: ?
  Filled 2021-02-26: qty 1

## 2021-02-26 MED ORDER — PROMETHAZINE HCL 25 MG/ML IJ SOLN
INTRAMUSCULAR | Status: AC
Start: 2021-02-26 — End: ?
  Filled 2021-02-26: qty 1

## 2021-02-26 MED ORDER — PROPOFOL 10 MG/ML IV EMUL (WRAP)
INTRAVENOUS | Status: AC
Start: 2021-02-26 — End: ?
  Filled 2021-02-26: qty 20

## 2021-02-26 MED ORDER — OXYCODONE HCL 5 MG PO TABS
5.0000 mg | ORAL_TABLET | Freq: Once | ORAL | Status: AC | PRN
Start: 2021-02-26 — End: 2021-02-26
  Administered 2021-02-26: 5 mg via ORAL

## 2021-02-26 MED ORDER — FENTANYL CITRATE (PF) 50 MCG/ML IJ SOLN (WRAP)
INTRAMUSCULAR | Status: DC | PRN
Start: 2021-02-26 — End: 2021-02-26
  Administered 2021-02-26: 100 ug via INTRAVENOUS

## 2021-02-26 MED ORDER — LACTATED RINGERS IV SOLN
50.0000 mL/h | INTRAVENOUS | Status: DC
Start: 2021-02-26 — End: 2021-02-26

## 2021-02-26 MED ORDER — HYDROMORPHONE HCL 0.5 MG/0.5 ML IJ SOLN
INTRAMUSCULAR | Status: AC
Start: 2021-02-26 — End: ?
  Filled 2021-02-26: qty 0.5

## 2021-02-26 MED ORDER — KETAMINE HCL 50 MG/ML IJ SOLN
INTRAMUSCULAR | Status: DC | PRN
Start: 2021-02-26 — End: 2021-02-26
  Administered 2021-02-26: 45 mg via INTRAVENOUS
  Administered 2021-02-26: 10 mg via INTRAVENOUS

## 2021-02-26 MED ORDER — SODIUM CHLORIDE 0.9 % IV SOLN
INTRAVENOUS | Status: DC | PRN
Start: 2021-02-26 — End: 2021-02-26
  Administered 2021-02-26 (×2): 1000 mL via SUBCUTANEOUS

## 2021-02-26 MED ORDER — FENTANYL CITRATE (PF) 50 MCG/ML IJ SOLN (WRAP)
25.0000 ug | INTRAMUSCULAR | Status: DC | PRN
Start: 2021-02-26 — End: 2021-02-26

## 2021-02-26 MED ORDER — DIPHENHYDRAMINE HCL 50 MG/ML IJ SOLN
INTRAMUSCULAR | Status: AC
Start: 2021-02-26 — End: ?
  Filled 2021-02-26: qty 1

## 2021-02-26 MED ORDER — PROPOFOL INFUSION 10 MG/ML
INTRAVENOUS | Status: DC | PRN
Start: 2021-02-26 — End: 2021-02-26
  Administered 2021-02-26: 150 ug/kg/min via INTRAVENOUS

## 2021-02-26 MED ORDER — SODIUM CHLORIDE 0.9 % IR SOLN
Status: DC | PRN
Start: 2021-02-26 — End: 2021-02-26
  Administered 2021-02-26: 500 mL

## 2021-02-26 MED ORDER — KETAMINE HCL 50 MG/ML IJ SOLN
INTRAMUSCULAR | Status: AC
Start: 2021-02-26 — End: ?
  Filled 2021-02-26: qty 10

## 2021-02-26 MED ORDER — ONDANSETRON HCL 4 MG/2ML IJ SOLN
4.0000 mg | Freq: Once | INTRAMUSCULAR | Status: DC | PRN
Start: 2021-02-26 — End: 2021-02-26

## 2021-02-26 MED ORDER — LIDOCAINE HCL 1 % IJ SOLN
INTRAMUSCULAR | Status: AC
Start: 2021-02-26 — End: ?
  Filled 2021-02-26: qty 50

## 2021-02-26 MED ORDER — FAMOTIDINE 10 MG/ML IV SOLN (WRAP)
20.0000 mg | Freq: Once | INTRAVENOUS | Status: AC
Start: 2021-02-26 — End: 2021-02-26
  Administered 2021-02-26: 08:00:00 20 mg via INTRAVENOUS

## 2021-02-26 MED ORDER — GABAPENTIN 100 MG PO CAPS
200.0000 mg | ORAL_CAPSULE | Freq: Three times a day (TID) | ORAL | Status: DC
Start: 2021-02-26 — End: 2021-02-26
  Administered 2021-02-26: 12:00:00 200 mg via ORAL

## 2021-02-26 MED ORDER — ONDANSETRON HCL 4 MG/2ML IJ SOLN
INTRAMUSCULAR | Status: AC
Start: 2021-02-26 — End: ?
  Filled 2021-02-26: qty 2

## 2021-02-26 MED ORDER — PROPOFOL 10 MG/ML IV EMUL (WRAP)
INTRAVENOUS | Status: DC | PRN
Start: 2021-02-26 — End: 2021-02-26
  Administered 2021-02-26: 50 mg via INTRAVENOUS
  Administered 2021-02-26: 200 mg via INTRAVENOUS

## 2021-02-26 MED ORDER — GABAPENTIN 100 MG PO CAPS
ORAL_CAPSULE | ORAL | Status: AC
Start: 2021-02-26 — End: ?
  Filled 2021-02-26: qty 2

## 2021-02-26 MED ORDER — ROCURONIUM BROMIDE 50 MG/5ML IV SOLN
INTRAVENOUS | Status: AC
Start: 2021-02-26 — End: ?
  Filled 2021-02-26: qty 5

## 2021-02-26 MED ORDER — ACETAMINOPHEN 500 MG PO TABS
1000.0000 mg | ORAL_TABLET | Freq: Once | ORAL | Status: AC
Start: 2021-02-26 — End: 2021-02-26
  Administered 2021-02-26: 08:00:00 1000 mg via ORAL

## 2021-02-26 MED ORDER — EPHEDRINE SULFATE 50 MG/ML IJ/IV SOLN (WRAP)
Status: DC | PRN
Start: 2021-02-26 — End: 2021-02-26
  Administered 2021-02-26 (×3): 5 mg via INTRAVENOUS

## 2021-02-26 MED ORDER — DIPHENHYDRAMINE HCL 50 MG/ML IJ SOLN
INTRAMUSCULAR | Status: DC | PRN
Start: 2021-02-26 — End: 2021-02-26
  Administered 2021-02-26: 25 mg via INTRAVENOUS

## 2021-02-26 MED ORDER — ROCURONIUM BROMIDE 10 MG/ML IV SOLN (WRAP)
INTRAVENOUS | Status: DC | PRN
Start: 2021-02-26 — End: 2021-02-26
  Administered 2021-02-26: 30 mg via INTRAVENOUS
  Administered 2021-02-26: 40 mg via INTRAVENOUS

## 2021-02-26 MED ORDER — CEFAZOLIN SODIUM 1 G IJ SOLR
2.0000 g | Freq: Once | INTRAMUSCULAR | Status: AC
Start: 2021-02-26 — End: 2021-02-26
  Administered 2021-02-26: 09:00:00 2 g via INTRAVENOUS

## 2021-02-26 MED ORDER — FAMOTIDINE 10 MG/ML IV SOLN (WRAP)
INTRAVENOUS | Status: AC
Start: 2021-02-26 — End: ?
  Filled 2021-02-26: qty 2

## 2021-02-26 MED ORDER — GABAPENTIN 100 MG PO CAPS
100.0000 mg | ORAL_CAPSULE | Freq: Once | ORAL | Status: AC
Start: 2021-02-26 — End: 2021-02-26
  Administered 2021-02-26: 08:00:00 100 mg via ORAL

## 2021-02-26 MED ORDER — FENTANYL CITRATE (PF) 50 MCG/ML IJ SOLN (WRAP)
INTRAMUSCULAR | Status: AC
Start: 2021-02-26 — End: ?
  Filled 2021-02-26: qty 2

## 2021-02-26 MED ORDER — MAGNESIUM SULFATE 50 % IJ SOLN
INTRAMUSCULAR | Status: AC
Start: 2021-02-26 — End: ?
  Filled 2021-02-26: qty 2

## 2021-02-26 MED ORDER — EPINEPHRINE HCL 1 MG/ML IJ SOLN (WRAP)
Status: AC
Start: 2021-02-26 — End: ?
  Filled 2021-02-26: qty 1

## 2021-02-26 MED ORDER — LIDOCAINE HCL 1 % IJ SOLN
INTRAMUSCULAR | Status: AC
Start: 2021-02-26 — End: ?
  Filled 2021-02-26: qty 10

## 2021-02-26 MED ORDER — ACETAMINOPHEN 500 MG PO TABS
1000.0000 mg | ORAL_TABLET | Freq: Four times a day (QID) | ORAL | Status: DC | PRN
Start: 2021-02-26 — End: 2021-02-26
  Administered 2021-02-26: 1000 mg via ORAL

## 2021-02-26 MED ORDER — LIDOCAINE HCL 1 % IJ SOLN
1.0000 mL | Freq: Once | INTRAMUSCULAR | Status: DC | PRN
Start: 2021-02-26 — End: 2021-02-26

## 2021-02-26 MED ORDER — MIDAZOLAM HCL 1 MG/ML IJ SOLN (WRAP)
INTRAMUSCULAR | Status: AC
Start: 2021-02-26 — End: ?
  Filled 2021-02-26: qty 2

## 2021-02-26 MED ORDER — HYDROMORPHONE HCL 1 MG/ML IJ SOLN
INTRAMUSCULAR | Status: AC
Start: 2021-02-26 — End: ?
  Filled 2021-02-26: qty 1

## 2021-02-26 MED ORDER — GLYCOPYRROLATE 0.2 MG/ML IJ SOLN (WRAP)
INTRAMUSCULAR | Status: DC | PRN
Start: 2021-02-26 — End: 2021-02-26
  Administered 2021-02-26: .6 mg via INTRAVENOUS
  Administered 2021-02-26: .1 mg via INTRAVENOUS

## 2021-02-26 MED ORDER — LIDOCAINE HCL (PF) 1 % IJ SOLN
INTRAMUSCULAR | Status: AC
Start: 2021-02-26 — End: ?
  Filled 2021-02-26: qty 5

## 2021-02-26 MED ORDER — HYDROMORPHONE HCL 1 MG/ML IJ SOLN
INTRAMUSCULAR | Status: DC | PRN
Start: 2021-02-26 — End: 2021-02-26
  Administered 2021-02-26 (×3): .5 mg via INTRAVENOUS

## 2021-02-26 MED ORDER — DEXAMETHASONE SODIUM PHOSPHATE 4 MG/ML IJ SOLN (WRAP)
INTRAMUSCULAR | Status: DC | PRN
Start: 2021-02-26 — End: 2021-02-26
  Administered 2021-02-26: 8 mg via INTRAVENOUS

## 2021-02-26 MED ORDER — ONDANSETRON HCL 4 MG/2ML IJ SOLN
INTRAMUSCULAR | Status: DC | PRN
Start: 2021-02-26 — End: 2021-02-26
  Administered 2021-02-26 (×2): 4 mg via INTRAVENOUS

## 2021-02-26 MED ORDER — LACTATED RINGERS IV SOLN
INTRAVENOUS | Status: DC
Start: 2021-02-26 — End: 2021-02-26
  Administered 2021-02-26: 08:00:00 1000 mL via INTRAVENOUS

## 2021-02-26 MED ORDER — LIDOCAINE HCL 2 % IJ SOLN
INTRAMUSCULAR | Status: DC | PRN
Start: 2021-02-26 — End: 2021-02-26
  Administered 2021-02-26: 50 mg

## 2021-02-26 MED ORDER — ACETAMINOPHEN 500 MG PO TABS
ORAL_TABLET | ORAL | Status: AC
Start: 2021-02-26 — End: ?
  Filled 2021-02-26: qty 2

## 2021-02-26 MED ORDER — AMMONIA AROMATIC IN INHA
1.0000 | Freq: Once | RESPIRATORY_TRACT | Status: DC | PRN
Start: 2021-02-26 — End: 2021-02-26

## 2021-02-26 MED ORDER — NEOSTIGMINE METHYLSULFATE 1 MG/ML IJ/IV SOLN (WRAP)
Status: AC
Start: 2021-02-26 — End: ?
  Filled 2021-02-26: qty 5

## 2021-02-26 MED ORDER — EPINEPHRINE HCL 1 MG/ML IJ SOLN (WRAP)
Status: AC
Start: 2021-02-26 — End: ?
  Filled 2021-02-26: qty 3

## 2021-02-26 MED ORDER — CEFAZOLIN SODIUM 1 G IJ SOLR
INTRAMUSCULAR | Status: AC
Start: 2021-02-26 — End: ?
  Filled 2021-02-26: qty 2000

## 2021-02-26 MED ORDER — NEOSTIGMINE METHYLSULFATE 1 MG/ML IJ/IV SOLN (WRAP)
Status: DC | PRN
Start: 2021-02-26 — End: 2021-02-26
  Administered 2021-02-26: 4 mg via INTRAVENOUS

## 2021-02-26 MED ORDER — SCOPOLAMINE 1 MG/3DAYS TD PT72
1.0000 | MEDICATED_PATCH | Freq: Once | TRANSDERMAL | Status: DC
Start: 2021-02-26 — End: 2021-02-26

## 2021-02-26 MED ORDER — PROMETHAZINE HCL 25 MG/ML IJ SOLN
6.2500 mg | Freq: Once | INTRAMUSCULAR | Status: DC | PRN
Start: 2021-02-26 — End: 2021-02-26

## 2021-02-26 MED ORDER — PROPOFOL 10 MG/ML IV EMUL (WRAP)
INTRAVENOUS | Status: AC
Start: 2021-02-26 — End: ?
  Filled 2021-02-26: qty 100

## 2021-02-26 MED ORDER — PROMETHAZINE HCL 25 MG/ML IJ SOLN
INTRAMUSCULAR | Status: DC | PRN
Start: 2021-02-26 — End: 2021-02-26
  Administered 2021-02-26: 12.5 mg via INTRAVENOUS

## 2021-02-26 MED ORDER — BACITRACIN ZINC 500 UNIT/GM EX OINT
TOPICAL_OINTMENT | CUTANEOUS | Status: AC
Start: 2021-02-26 — End: ?
  Filled 2021-02-26: qty 15

## 2021-02-26 MED ORDER — BUPIVACAINE-EPINEPHRINE (PF) 0.5% -1:200000 IJ SOLN
INTRAMUSCULAR | Status: AC
Start: 2021-02-26 — End: ?
  Filled 2021-02-26: qty 30

## 2021-02-26 MED ORDER — HYDROMORPHONE HCL 0.5 MG/0.5 ML IJ SOLN
0.5000 mg | INTRAMUSCULAR | Status: DC | PRN
Start: 2021-02-26 — End: 2021-02-26

## 2021-02-26 MED ORDER — MIDAZOLAM HCL 1 MG/ML IJ SOLN (WRAP)
INTRAMUSCULAR | Status: DC | PRN
Start: 2021-02-26 — End: 2021-02-26
  Administered 2021-02-26: 2 mg via INTRAVENOUS

## 2021-02-26 MED ORDER — MAGNESIUM SULFATE 50 % IJ SOLN
INTRAMUSCULAR | Status: DC | PRN
Start: 2021-02-26 — End: 2021-02-26
  Administered 2021-02-26: 2 g via INTRAVENOUS

## 2021-02-26 MED ORDER — LIDOCAINE 4 % EX CREA
TOPICAL_CREAM | Freq: Once | CUTANEOUS | Status: DC | PRN
Start: 2021-02-26 — End: 2021-02-26

## 2021-02-26 SURGICAL SUPPLY — 108 items
ADHESIVE BOND DRMBND 42CM LF MESH TPE EZ (Skin Closure) ×6
ADHESIVE SKIN CLOSURE DERMABOND PRINEO MESH TAPE EASY REMOVE L42 CM (Skin Closure) IMPLANT
BANDAGE CMPR PLSTR CTTN MED MTRX 5YDX6IN (Bandage) ×6
BANDAGE ELASTIC L5 YD X W6 IN W/SELF-CLOSURE HOOK (Bandage) ×4 IMPLANT
BANDAGE MEDIUM ELASTIC MATRIX POLYESTER COTTON L5 YD X W6 IN (Bandage) IMPLANT
BANDAGE MEDLINE MEDIUM COMPRESSION L5 YD (Bandage) ×4 IMPLANT
BINDER ABD 3XL STD 74-85IN 12IN LF 4 PNL (Patient Supply) ×3
BINDER ABDOMINAL H12 IN 3XL STANDARD 4 (Patient Supply) ×2 IMPLANT
BINDER ABDOMINAL H12 IN 3XL STANDARD 4 PANEL ELASTIC FLEXIBLE OD74-85 (Patient Supply) IMPLANT
BLADE 11 BARD-PARKER RIB-BACK CARBON (Blade) ×2 IMPLANT
BLADE 11 BARD-PARKER RIB-BACK CARBON STEEL TISSUE SURGICAL (Blade) ×2 IMPLANT
BLADE SRG CBNSTL 11 BP RB-BCK LF STRL (Blade) ×3
BULB DRAINAGE LIGHTWEIGHT LOW LEVEL (Drain) ×4 IMPLANT
BULB DRAINAGE LIGHTWEIGHT LOW LEVEL SUCTION RELIAVAC SILICONE 100 CC (Drain) ×4 IMPLANT
BULB DRN SIL 100CC LF STRL LTWT LO LVL (Drain) ×6
CANISTER SCT PLYCRB 1500CC CRD LF NS (Suction) ×3
CANISTER SUCTION 1500 CC HOLDER DIRECT (Suction) ×2 IMPLANT
CANISTER SUCTION 1500 CC HOLDER DIRECT TO REGULATOR CRD POLYCARBONATE (Suction) ×2 IMPLANT
DERMABOND PRINEO SKIN CLOSURE SYSTEM (Skin Closure) ×4 IMPLANT
DRAIN INCS SIL FULL FLUT RND 15FR 3/16IN (Drain) ×6
DRAIN OD15 FR RADIOPAQUE 4 FREE FLOW (Drain) ×4 IMPLANT
DRAIN OD15 FR RADIOPAQUE 4 FREE FLOW CHANNEL CHANNEL DRAIN L3/16 IN (Drain) ×4 IMPLANT
DRAPE SRG TBRN CNVRT 76X52IN LF STRL .75 (Drape) ×6
DRAPE SURGICAL 3/4 SHEET FANFOLD L76 IN (Drape) ×4 IMPLANT
DRAPE SURGICAL 3/4 SHEET FANFOLD L76 IN X W52 IN TIBURON (Drape) IMPLANT
DRESSING PETRO 3% BI 3BRM GZE XR 8X1IN (Dressing) ×3
DRESSING PETROLATUM XEROFORM L8 IN X W1 (Dressing) ×2 IMPLANT
DRESSING PETROLATUM XEROFORM L8 IN X W1 IN 3% BISMUTH TRIBROMOPHENATE (Dressing) IMPLANT
ELECTRODE ADULT PATIENT RETURN L9 FT REM POLYHESIVE ACRYLIC FOAM (Procedure Accessories) ×2 IMPLANT
ELECTRODE PATIENT RETURN L9 FT VALLEYLAB (Procedure Accessories) ×4 IMPLANT
ELECTRODE PT RTN RM PHSV ACRL FM C30- LB (Procedure Accessories) ×6
GLOVE SRG PLISPRN 8.5 BGL PI MIC 301MM (Glove) ×3
GLOVE SURGICAL 8.5 BIOGEL PI MICRO (Glove) ×2 IMPLANT
GLOVE SURGICAL 8.5 BIOGEL PI MICRO POWDER FREE ANTISLIP MICRO ROUGHEN (Glove) ×2 IMPLANT
GOWN SRG XL ORBS LF STRL AAMI LVL 4 (Gown) ×9
GOWN SURGICAL XL BLUE AAMI LEVEL 4 BREATHABLE CSR WRAP PROTECTION (Gown) ×6 IMPLANT
GOWN SURGICAL XL MEDLINE BLUE AAMI LEVEL (Gown) ×6 IMPLANT
NEEDLE HPO SS PP RW BD 25GA 1.5IN LF (Needles) ×6 IMPLANT
PAD ABD DERMACEA 9X5IN LF STRL 4 SL EDG (Dressing) ×36
PAD ABDOMINAL L9 IN X W5 IN 4 SEAL EDGE (Dressing) IMPLANT
PAD ARMBOARD FOAM 20X8X2IN (Positioning Supplies) ×3
PAD ARMBOARD L20 IN X W8 IN X H2 IN (Positioning Supplies) ×2 IMPLANT
PAD ARMBOARD L20 IN X W8 IN X H2 IN CONVOLUTE FOAM PURPLE (Positioning Supplies) ×2 IMPLANT
PAD SRGPRP 44X24IN NS CUF 9IN (Prep) ×4 IMPLANT
PEN SRGMRK 6IN LF STRL RLR LG RSRV REG (Positioning Supplies) ×18
PEN SURGICAL MARKING MEDLINE SKIN RULER (Positioning Supplies) ×12 IMPLANT
PEN SURGICAL MARKING SKIN RULER LARGE RESERVOIR REGULAR TIP LABEL (Positioning Supplies) ×4 IMPLANT
PIN SAFETY MEDIUM 1.5 IN GROUPING HOLDING RETAINING SMALL ITEMS (Sterilization Supply) ×2 IMPLANT
PIN STERILE SAFETY MEDIUM (Sterilization Supply) ×3 IMPLANT
POSITIONER OR FM LF HI RSLNT CSHN (Positioning Supplies) ×3
POSITIONER OR HIGH RESILIENT CUSHION (Positioning Supplies) ×2 IMPLANT
POSITIONER OR HIGH RESILIENT CUSHION MULTIRING FOAM HEAD RASPBERRY (Positioning Supplies) ×2 IMPLANT
SLEEVE CMPR MED KN LGTH KDL SCD 21- IN (Sleeve) ×3 IMPLANT
SLEEVE COMPRESSION MEDIUM KNEE LENGTH KENDALL SEQUENTIAL OD21- IN (Sleeve) ×2 IMPLANT
SOLUTION PREP LIQUID ANTIMICROBIAL FLIP (Prep) ×2 IMPLANT
SOLUTION PREP LIQUID ANTIMICROBIAL FLIP TOP BOTTLE ANTISEPTIC DYNA-HEX (Prep) ×2 IMPLANT
SOLUTION PRP 4% CHG 4OZ DYN-HEX 4 LF (Prep) ×3
SPONGE LAP CTTN 18X18IN LF STRL 4 PLY (Sponge) ×15
SPONGE LAPAROTOMY L18 IN X W18 IN 4 PLY (Sponge) ×10 IMPLANT
SPONGE LAPAROTOMY L18 IN X W18 IN 4 PLY RADIOPAQUE PYRONEMA FREE HIGH (Sponge) ×6 IMPLANT
STAPLER SKIN L4.1 MM X W6.5 MM 35 WIDE (Staplers) ×2 IMPLANT
STAPLER SKIN L4.1 MM X W6.5 MM 35 WIDE STAPLE CARTRIDGE APPOSE ULC (Staplers) ×2 IMPLANT
STAPLER SKN SS PLS APS U 4.1X6.5MM LF 35 (Staplers) ×3
STRAP STRCHR SONTARA PLSTR 60X3IN LF NS (Procedure Accessories) ×3 IMPLANT
STRAP STRETCHER L60 IN X W3 IN OR TABLE HOOK SONTARA POLYESTER WHITE (Procedure Accessories) ×2 IMPLANT
STRIP SKIN CLOSURE L5 IN X W1 IN (Dressing) ×2 IMPLANT
STRIP SKIN CLOSURE L5 IN X W1 IN REINFORCE STERI-STRIP POLYESTER WHITE (Dressing) IMPLANT
STRIP SKNCLS PLSTR STRSTRP 5X1IN LF STRL (Dressing) ×3
SUTURE ABS 1 TP-1 PDS2 96IN MFL LOOP (Suture) ×3
SUTURE ABS 2-0 CT1 VCL 36IN BRD COAT UD (Suture) ×9
SUTURE ABS 2-0 CTX VCL 36IN BRD COAT UD (Suture) ×24
SUTURE ABS 3-0 PS2 MNCRL MTPS 27IN MFL (Suture) ×12
SUTURE COATED VICRYL 2-0 CT-1 L36 IN (Suture) ×6 IMPLANT
SUTURE COATED VICRYL 2-0 CT-1 L36 IN BRAID COATED UNDYED ABSORBABLE (Suture) ×12 IMPLANT
SUTURE COATED VICRYL 2-0 CTX L36 IN (Suture) ×16 IMPLANT
SUTURE COATED VICRYL 2-0 CTX L36 IN BRAID COATED UNDYED ABSORBABLE (Suture) ×16 IMPLANT
SUTURE ETHILON BLACK 3-0 PS-1 L18 IN (Suture) ×12 IMPLANT
SUTURE ETHILON BLACK 3-0 PS-1 L18 IN MONOFILAMENT NONABSORBABLE (Suture) ×4 IMPLANT
SUTURE MONOCRYL 3-0 PS-2 L27 IN (Suture) ×8 IMPLANT
SUTURE MONOCRYL 3-0 PS-2 L27 IN MONOFILAMENT UNDYED ABSORBABLE (Suture) ×8 IMPLANT
SUTURE NABSB 3-0 PS1 ETH MTPS 18IN MFL (Suture) ×18
SUTURE NABSB 5-0 PS2 PRLN MTPS 18IN MFL (Suture) ×3
SUTURE PDS II 1 TP-1 L96 IN MONOFILAMENT (Suture) ×2 IMPLANT
SUTURE PDS II 1 TP-1 L96 IN MONOFILAMENT LOOP VIOLET ABSORBABLE (Suture) ×2 IMPLANT
SUTURE POLYAMIDE ETHILON BLACK 3-0 PS-1 L18 IN MONOFILAMENT (Suture) IMPLANT
SUTURE PROLENE BLUE 5-0 PS-2 L18 IN (Suture) ×2 IMPLANT
SUTURE PROLENE BLUE 5-0 PS-2 L18 IN MONOFILAMENT NONABSORBABLE (Suture) IMPLANT
SYRINGE 10 ML GRADUATE NONPYROGENIC DEHP (Syringes, Needles) ×4 IMPLANT
SYRINGE 10 ML GRADUATE NONPYROGENIC DEHP FREE PVC FREE LOK MEDICAL (Syringes, Needles) ×2 IMPLANT
SYRINGE 50 ML GRADUATE NONPYROGENIC DEHP (Syringes, Needles) ×4 IMPLANT
SYRINGE 50 ML GRADUATE NONPYROGENIC DEHP FREE PVC FREE LOK MEDICAL (Syringes, Needles) ×2 IMPLANT
SYRINGE MED 10ML LL LF STRL GRAD N-PYRG (Syringes, Needles) ×6
SYRINGE MED 50ML LL LF STRL GRAD N-PYRG (Syringes, Needles) ×6
TAPE SRG CLTH DPRE 10YDX3IN LF HPOAL WTR (Tape) ×3
TAPE SURGICAL L10 YD X W3 IN (Tape) ×2 IMPLANT
TAPE SURGICAL L10 YD X W3 IN HYPOALLERGENIC WATER RESISTANT (Tape) ×2 IMPLANT
TOWEL OR CTTN STD STRG 27X17IN LF STRL (Procedure Accessories) ×18
TOWEL OR L27 IN X W17 IN STANDARD (Procedure Accessories) ×12 IMPLANT
TOWEL OR L27 IN X W17 IN STD STRGHT PREWASH DELINT MEDLN COTTON BLUE (Procedure Accessories) ×2 IMPLANT
TRAY SKIN SCRUB 6 WING 6 SPONGE STICK 2 TIP APPLICATOR DRY VINYL (Tray) ×2 IMPLANT
TRAY SKIN SCRUB MEDLINE VINYL COTTON 6 (Tray) ×2 IMPLANT
TRAY SKN SCRB VNYL CTTN LF STRL 6 WNG 6 (Tray) ×3
TRAY SRG PLASTIC SX WOODBURN (Pack) ×3 IMPLANT
TRAY SURGICAL PLASTICS WOODBURN (Pack) ×2 IMPLANT
TUBING SCT PSITC 12FT LF STRL DISP (Tubing) ×3
TUBING SCT SIL SPK 156IN STRL INFLTR SLV (Procedure Accessories) ×3 IMPLANT
TUBING SUCTION 156IN SPIKE SLCN INFILTRATION SLV SM BORE STRL DSPSBL (Procedure Accessories) ×2 IMPLANT
TUBING SUCTION L12 FT PSI-TEC (Tubing) ×2 IMPLANT

## 2021-02-26 NOTE — Op Note (Addendum)
FULL OPERATIVE NOTE    Date Time: 02/26/21 11:33 AM  Patient Name: Melinda Brown  Attending Physician: Marybelle Killings, MD      Date of Operation:   02/26/2021    Providers Performing:   Surgeon(s):  Bitar, Deno Lunger, MD  Sharyn Creamer, MD  Virgil Benedict, [place]    Circulator: Andree Coss, RN  Scrub Person: Buzzy Han  Second Circulator: Enid Baas, RN    Operative Procedure:   Procedure(s):  ABDOMINOPLASTY-EXTENDED WITH LIPOSUCTION TO FLANKS  BILATERAL INNER THIGH AND BILATERAL KNEE LIPOSUCTION(COSMETIC)    Preoperative Diagnosis:   Pre-Op Diagnosis Codes:     * Encounter for cosmetic surgery [Z41.1]  Abdominal skin elastosis   Excess adiposity of flanks, medial thighs, and knees    Postoperative Diagnosis:   Post-Op Diagnosis Codes:     * Encounter for cosmetic surgery [Z41.1]  Abdominal skin elastosis   Excess adiposity of flanks, medial thighs, and knees  Indications:   This is a 60 y/o female who came to my office requesting abdominoplasty and liposuction of the flanks, medial thighs, and knees.  On physical exam, there was excess skin and fat in the lower abdomen and excess adipose tissue in the upper back, upper abdomen, flanks, medial thighs, and knees.  There were no hernias noted.  I discussed with the patient an abdominoplasty with liposuction.  The risks, benefits, and potential complications were discussed.  The patient wanted to proceed with an abdominoplasty with liposuction of the upper back, upper abdomen, flanks, medial thighs, and knees at the present time understanding fully the risks.  The patient was cleared by their medical doctor, consents were signed, and photographs taken.  COVID test was negative.  The patient was taken to the operating room.    Operative Notes:   The patient was identified as Melinda Brown and was given preoperative IV antibiotics, pneumatics placed on the lower extremities, and was intubated uneventfully.  A foley was placed.  The patient was  prepped and draped in standard surgical fashion.  Stab incisions were made in the anterior superior iliac crest region, superior umbilicus, lower abdomen, anterior thighs, and medial knees.  Tumescent solution was injected into the upper abdomen, upper back, flanks, medial thighs, and knees. After waiting for the vasoconstrictive effects of epinephrine, liposuction was carried out initially and the amounts removed are noted next: L flank 300 cc, R flank 300 cc, L upper back 100 cc, R upper back 100 cc, upper abdomen 300 cc, L medial thigh 200 cc, R medial thigh 200 cc, R knee 150 cc, and L knee 150 cc.  Next, a lower abdominal incision was made from the anterior superior iliac crest to the contralateral anterior superior iliac crest.  The skin was gently elevated to the rib cage, sparing the umbilicus and umbilical stalk.  The excess abdominal tissue was resected and excellent hemostasis was obtained.  The left and right rectus abdominis muscles were imbricated with 2-0 Vicryl sutures and 0-PDS running suture, sparing the umbilicus and umbilical stalk.  Next, two #15 hubless Blake drains were placed under the abdominal flaps and brought out from the previous incisions in the anterior thigh and sutured with 3-0 Nylon sutures to the skin.  The table was flexed, the superior and inferior abdominal flaps were brought together with towel clips, and the location of the new umbilicus was marked on the midabdomen.  The skin was then excised and the skin flap around the umbilicus was conservatively  defatted to create a nice contour around the umbilicus.  The umbilicus was brought out from the new location and sutured to the surrounding skin with 3-0 Monocryl sutures for the dermis and 5-0 Prolene sutures for the skin.  The superior and inferior abdominal flaps were brought together with 2-0 Vicryl sutures for the fascia, 2-0 Vicryl sutures for the dermis, and running 3-0 Monocryl sutures for the skin.  The patient was  inspected to have a good improvement of the abdominal contour.  JP bulbs were placed on suction.  Primeo was placed along the length of the incision, and a strip of xeroform inside the umbilicus.  ABD pads and an abdominal binder were placed.  The patient was awakened, extubated, and taken to the recovery room in no apparent distress.    Estimated Blood Loss:   100 mL    Urine Output: 150 cc    Tumescent solution : 2000 cc    Fluids: 2700  cc  Implants:   * No implants in log *    Drains:   Drains: Yes, Drain #1&2: Blake Round 15 Fr    Specimens:   * No specimens in log *      Complications:   None apparent    Signed by: Lilli Light. Dorna Bloom, MD

## 2021-02-26 NOTE — Anesthesia Preprocedure Evaluation (Signed)
Anesthesia Evaluation    AIRWAY    Mallampati: II    TM distance: >3 FB  Neck ROM: full  Mouth Opening:full   CARDIOVASCULAR    cardiovascular exam normal, regular and normal       DENTAL    no notable dental hx     PULMONARY    pulmonary exam normal and clear to auscultation     OTHER FINDINGS                  Relevant Problems   ANESTHESIA   (+) Obstructive sleep apnea syndrome      PULMONARY   (+) Obstructive sleep apnea syndrome      CARDIO   (+) Coronary atherosclerosis due to calcified coronary lesion               Anesthesia Plan    ASA 2     general                     intravenous induction   Detailed anesthesia plan: general endotracheal  Monitors/Adjuncts: other    Post Op: other plan for postoperative opioid use    Post op pain management: per surgeon    informed consent obtained    Plan discussed with CRNA.            ===============================================================  Inpatient Anesthesia Evaluation    Patient Name: ZOXWR,UEAVW Melinda Brown  Surgeon: Marybelle Killings, MD  Patient Age / Sex: 60 y.o. / female    Medical History:     Past Medical History:   Diagnosis Date    Abnormal vision     wears reading glasses    Claustrophobia     History of varicose veins     Post-operative nausea and vomiting     s/p 09/2020 surgery    Seasonal allergic rhinitis        Past Surgical History:   Procedure Laterality Date    APPENDECTOMY (OPEN)  06/08/2001    AUGMENTATION, BREAST, (MEDICAL) Bilateral 2011    BLEPHAROPLASTY, UPPER & LOWER, (MEDICAL) Bilateral 09/18/2020    Procedure: EYELID LIFT LOWER BILATERAL, EYELID LIFT UPPER WITH BROWPEXY BILATERAL, FACELIFT INCLUDES NECKLIFT, FAT GRAFTING: LIPS AND SMILE LINES (COSMETIC);  Surgeon: Marybelle Killings, MD;  Location: [facility] SURGERY OR;  Service: Plastics;  Laterality: Bilateral;  Eye Lid     COLONOSCOPY  07/10/2011    DERMAL FAT GRAFT (COSMETIC) N/A 09/18/2020    Procedure: FAT GRAFTING: LIPS AND SMILE LINES (COSMETIC) FAT HARVESTED FROM ABDOMINE;   Surgeon: Marybelle Killings, MD;  Location: [facility] SURGERY OR;  Service: Plastics;  Laterality: N/A;  Lip    FACELIFT, (RHYTIDECTOMY) (COSMETIC) Bilateral 09/18/2020    Procedure: FACELIFT INCLUDED NECKLIFT (COSMETIC);  Surgeon: Marybelle Killings, MD;  Location: [facility] SURGERY OR;  Service: Plastics;  Laterality: Bilateral;    LUMPECTOMY Left 2011    VARICOSE VEIN SURGERY  10/06/2001    varicose vein stripping         Allergies:   No Known Allergies      Medications:     Current Facility-Administered Medications   Medication Dose Route Frequency Last Rate Last Admin    ceFAZolin (ANCEF) injection 2 g  2 g Intravenous Once        lactated ringers infusion   Intravenous Continuous 20 mL/hr at 02/26/21 0741 1,000 mL at 02/26/21 0741    lidocaine (LMX) 4 % cream   Topical Once PRN  lidocaine (XYLOCAINE) 1 % injection 1 mL  1 mL Intradermal Once PRN        scopolamine (TRANSDERM-SCOP) patch (1.5 mg) 1 mg/72 hrs 1 patch  1 patch Transdermal Once                  Prior to Admission medications    Medication Sig Start Date End Date Taking? Authorizing Provider   zolpidem (AMBIEN CR) 12.5 MG CR tablet Take 12.5 mg by mouth nightly as needed 12/29/20  Yes [provider]   scopolamine (Transderm-Scop, 1.5 MG,) Place 1 patch onto the skin every third day 02/07/21 02/07/22  Sweitzer, Dawayne Cirri, MD     Vitals   Temp:  [36.6 C (97.9 F)] 36.6 C (97.9 F)  Heart Rate:  [64] 64  Resp Rate:  [16] 16  BP: (131)/(72) 131/72    Wt Readings from Last 3 Encounters:   02/26/21 86.1 kg (189 lb 12.8 oz)   02/07/21 83.9 kg (185 lb)   02/04/21 86.6 kg (191 lb)     BMI (Estimated body mass index is 30.63 kg/m as calculated from the following:    Height as of this encounter: 1.676 m (5\' 6" ).    Weight as of this encounter: 86.1 kg (189 lb 12.8 oz).)  Temp Readings from Last 3 Encounters:   02/26/21 36.6 C (97.9 F) (Temporal)   02/04/21 37 C (98.6 F)   09/18/20 36.6 C (97.8 F) (Temporal)     BP Readings from Last 3  Encounters:   02/26/21 131/72   02/04/21 130/84   09/18/20 112/69     Pulse Readings from Last 3 Encounters:   02/26/21 64   02/04/21 69   09/18/20 78           Labs:   CBC:  Lab Results   Component Value Date    WBC 4.35 02/04/2021    HGB 14.7 02/04/2021    HCT 43.6 02/04/2021    PLT 177 02/04/2021       Chemistries:  Lab Results   Component Value Date    NA 138 02/04/2021    K 4.3 02/04/2021    CL 103 02/04/2021    CO2 27 02/04/2021    BUN 11.0 02/04/2021    CREAT 0.9 02/04/2021    GLU 84 02/04/2021    CA 9.9 02/04/2021    AST 22 02/04/2021       Coags:  Lab Results   Component Value Date    PT 11.5 02/04/2021    PTT 34 02/04/2021    INR 1.0 02/04/2021     _____________________      Signed by: Florestine Avers, MD  02/26/21   7:57 AM    =============================================================           Signed by: Florestine Avers, MD 02/26/21 7:57 AM

## 2021-02-26 NOTE — Brief Op Note (Signed)
BRIEF OP NOTE    Date Time: 02/26/21 11:06 AM    Patient Name:   [name]    Date of Operation:   02/26/2021    Providers Performing:   Surgeon(s):  Emali Heyward, Deno Lunger, MD  Sharyn Creamer, MD  Virgil Benedict, [place]    Assistant (s):   Circulator: Andree Coss, RN  Scrub Person: Buzzy Han  Second Circulator: Enid Baas, RN    Operative Procedure:   Procedure(s):  ABDOMINOPLASTY-EXTENDED WITH LIPOSUCTION TO FLANKS  BILATERAL INNER THIGH AND BILATERAL KNEE LIPOSUCTION(COSMETIC)    Abdominoplasty 1130 g  Liposuction  Flanks  L=R= 300 cc each, upper back  L=R= 100 cc each, upper abd = 300 cc, medial thighs L=R= 200 cc each, knee R=L= 150 cc each    Preoperative Diagnosis:   Abdominal laxity and excess adipose tissue      Postoperative Diagnosis:   Abdominal laxityand excess adipose tissue    Anesthesia:   General  EBL= 100 cc  Tumescence= 2,000 cc  U.O. = 150 cc  IVF= 2,700 cc    Estimated Blood Loss:    * No values recorded between 02/26/2021  8:39 AM and 02/26/2021 11:06 AM *    Implants:   * No implants in log *    Drains:   Drains: Yes, Drain #2: Blake Round 15 Fr    Specimens:   * No specimens in log *      Findings:       Complications:   None       Signed by: Marybelle Killings, MD                                                                           [facility] SUR[facility] OR

## 2021-02-26 NOTE — Discharge Instr - AVS First Page (Addendum)
Discharge Instructions  - please review discharge instructions provided at preoperative appointment  - eat high protein diet (>80 grams/day)  - leave dressings in place  - monitor and record drain outputs  - no heavy lifting or strenuous activity  - some staining of the dressings are expected  - follow-up as scheduled tomorrow    Last time you had pain medicine:    Tylenol (also called Acetaminophen) total of 2000mg , last dose at 12:22PM.  Do NOT exceed more than 3,000mg  of Tylenol in 24 hrs.  Gabapentin (also called Neurontin) 300  mg, last dose at 12:20PM.  Oxycodone 5mg  at 11:41AM.    Last time you had anti-nausea medicine:    Zofran (also called Ondansetron)  4mg  at 11:06AM.  Pepcid (also called famotidine) 20mg  at 07:41AM.    Last time you had an anti-biotic:    Ancef (Also called Cefazolin) 2g at 08:56AM.      SCOPOLAMINE PATCH INSTRUCTION    You have had a scopolamine patch placed behind your  right ear to help prevent or alleviate nausea and vomiting after surgery.  Keep the patch dry, if possible, to prevent it from falling off. Limited contact with water, however, as in bathing or swimming, will not affect the system. If the patch falls off, throw it away and put a new one behind the other ear  Remove the patch 24-72 hours after your surgery. However you may remove the patch sooner if the side effects bother you.  After removing the patch, be sure to wash your hands and the area behind your ear with soap and water thoroughly.  To avoid accidental contact or ingestion by children or pets, fold the used patch in half with the sticky side together and dispose in a secure trash bin.  If you are still having nausea and/or vomiting the day after your surgery, contact your physician.  You may get drowsy or dizzy. Do not drive, use machinery, or do anything that needs mental alertness until you know how this medicine affects you. Do not stand or sit up quickly, especially if you are an older patient. This reduces  the risk of dizzy or fainting spells. Alcohol may interfere with the effect of this medicine. Avoid alcoholic drinks.  Your mouth may get dry. Chewing sugarless gum or sucking hard candy, and drinking plenty of water may help. Contact your doctor if the problem does not go away or is severe.  This medicine may cause dry eyes and blurred vision. If you wear contact lenses you may feel some discomfort. Lubricating drops may help. See your eye doctor if the problem does not go away or is severe.  If you are going to have a magnetic resonance imaging (MRI) procedure, tell your MRI technician if you have this patch on your body. It must be removed before a MRI.    Side effects that you should report to your doctor or health care professional as soon as possible:  agitation, nervousness, confusion, and delirium  blurred vision and other eye problems  dizziness, drowsiness  eye pain or redness in the whites of the eye  hallucinations  pain or difficulty passing urine  skin rash, itching  vomiting  Side effects that usually do not require medical attention (report to your doctor or health care professional if they continue or are bothersome):  headache  nausea  What may interact with this medicine?  benztropine  bethanechol  medicines for anxiety or sleeping problems like diazepam or temazepam  medicines for hay fever and other allergies  medicines for mental depression  muscle relaxants      Caring  For Your Jackson-Pratt (JP) Drain    A Jackson-Pratt (JP) Drain is used to help empty excess fluid from the body after surgery. Use of a drain can help in the healing process.   The following instructions will help you care for your drain following your procedure.    How To Empty Your [facility] and dry your hands before emptying the drain.  Unplug the stopper on top of the bulb. Turn the bulb upside down and squeeze  contents into the measuring cup. Record the amount of fluid drained as needed   (usually 2 times per day).  Include the date and time it was emptied.  Use an alcohol swab to wipe the stopper and bulb opening clean.  Squeeze the bulb until all the air is out and then replace the stopper. This will    create the suction necessary to remove the fluid from your body. The bulb  should remain compressed at all times unless emptying.  Once the bulb is closed and secured, wash your hands again.    Helpful Tips For Your Drain  When showering, try not to let your drain(s) dangle. Try looping your drain(s) around a belt, long necklace, or lanyard.  You may have a clear dressing covering the skin where the drain is placed. It is ok to shower with it on. If there is no dressing, ask your doctor if it is ok to apply antibiotic ointment to the site prior to showering.  There may be red stringy material in the drainage - do not worry! This material does, however, tend to block the tubing. As a result, you may need to strip the tubing:      With one hand, hold the tubing where it leaves the skin.   With the other hand, use either alcohol swabs (wrap the wipe around the tubing) or put lotion on your fingertips (to facilitate a smooth glide) and pinch the tubing.  Slowly and firmly pull your fingers down the tubing to flatten. This will move the stringy material down the tube.    When To Call Your Surgeon  Always call your surgeon if you have questions or experience any of the following:  Fever, swelling, redness or warmth around the drain site  The tube falls out  Emptying the drain several times a day and it is filling up quickly  The fluid color changes from light to very dark red  Fluid coming out from around the drain site     JP DRAIN LOG    Date Time JP Drain # Amount  JP Drain instructions video:    [email or web address]    Anesthesia: After Your  Surgery  Youve just had surgery. During surgery, you received medication called anesthesia to keep you comfortable and pain-free. After surgery, you may experience some pain or nausea. This is normal. Here are some tips for feeling better and recovering after surgery.     Stay on schedule with your medication.   Going Home  Your doctor or nurse will show you how to take care of yourself when you go home. He or she will also answer your questions. Have an adult family member or friend drive you home. For the first 24 hours after your surgery:  Do not drive or use heavy equipment.  Do not make important decisions or sign documents.  Avoid alcohol.  Have someone stay with you, if possible. They can watch for problems and help keep you safe.  Be sure to keep all follow-up doctors appointments. And rest after your procedure for as long as your doctor tells you to.    Coping with Pain  If you have pain after surgery, pain medication will help you feel better. Take it as directed, before pain becomes severe. Also, ask your doctor or pharmacist about other ways to control pain, such as with heat, ice, and relaxation. And follow any other instructions your surgeon or nurse gives you.    Tips for Taking Pain Medication  To get the best relief possible, remember these points:  Pain medications can upset your stomach. Taking them with a little food may help.  Most pain relievers taken by mouth need at least 20 to 30 minutes to take effect.  Taking medication on a schedule can help you remember to take it. Try to time your medication so that you can take it before beginning an activity, such as dressing, walking, or sitting down for dinner.  Constipation is a common side effect of pain medications. Drink lots of fluids. Eating fruit and vegetables can also help. Dont take laxatives unless your surgeon has prescribed them.  Mixing alcohol and pain medication can cause dizziness and slow your breathing. It can even be fatal.  Dont drink alcohol while taking pain medication.  Pain medication can slow your reflexes. Dont drive or operate machinery while taking pain medication,    Managing Nausea  Some people have an upset stomach after surgery. This is often due to anesthesia, pain, pain medications, or the stress of surgery. The following tips will help you manage nausea and get good nutrition as you recover. If you were on a special diet before surgery, ask your doctor if you should follow it during recovery. These tips may help:  Dont push yourself to eat. Your body will tell you what to eat and when.  Start off with liquids and soup. They are easier to digest.  Progress to semisolids (mashed potatoes, applesauce, and gelatin) as you feel ready.  Slowly move to solid foods. Dont eat fatty, rich, or spicy foods at first.  Dont force yourself to have three large meals a day. Instead, eat smaller amounts more often.  Take pain medications with a small amount of solid food, such as crackers or toast.    Call Your Surgeon If  You still have pain an hour after taking medication (it may not be strong enough).  You feel too sleepy, dizzy, or groggy (medication may be too strong).  You have side effects like nausea, vomiting, or skin changes (rash, itching, or hives).  Signs of infection - fever over 101, chills. Incision site redness, warmth, swelling, foul odor or purulent drainage.  Persistent bleeding at the operative site.    [address], [address] [place], [place] [address]. All rights reserved. This information is not intended as a substitute for professional medical care. Always follow your healthcare professional's instructions.

## 2021-02-26 NOTE — Interval H&P Note (Signed)
ADMISSION HISTORY AND PHYSICAL EXAM    Date Time: 02/26/21 7:47 AM  Patient Name: Melinda Brown  Attending Physician: Marybelle Killings, MD    Assessment:   Abdominal skin elastosis and lipodystrophy  Bilateral thigh lipodystrophy    Plan:   - extended abdominoplasty today with liposuction of the flanks, thighs, and knees  - she has no further questions today and is ready to proceed    History of Present Illness:   Melinda Brown is a 60 y.o. female who presents to the hospital for correction of abdominal skin elastosis and excess adiposity of the flanks and thighs.  She had previous liposuction of the abdomen and now has excess skin.  She also would like correction of her thighs and flanks.  No chest pain, shortness of breath, abdominal pain, fever/chills, nausea/vomiting, altered bowel habits.    Past Medical History:     Past Medical History:   Diagnosis Date    Abnormal vision     wears reading glasses    Claustrophobia     History of varicose veins     Post-operative nausea and vomiting     s/p 09/2020 surgery    Seasonal allergic rhinitis        Past Surgical History:     Past Surgical History:   Procedure Laterality Date    APPENDECTOMY (OPEN)  06/08/2001    AUGMENTATION, BREAST, (MEDICAL) Bilateral 2011    BLEPHAROPLASTY, UPPER & LOWER, (MEDICAL) Bilateral 09/18/2020    Procedure: EYELID LIFT LOWER BILATERAL, EYELID LIFT UPPER WITH BROWPEXY BILATERAL, FACELIFT INCLUDES NECKLIFT, FAT GRAFTING: LIPS AND SMILE LINES (COSMETIC);  Surgeon: Marybelle Killings, MD;  Location: [facility] SUR[facility] OR;  Service: Plastics;  Laterality: Bilateral;  Eye Lid     COLONOSCOPY  07/10/2011    DERMAL FAT GRAFT (COSMETIC) N/A 09/18/2020    Procedure: FAT GRAFTING: LIPS AND SMILE LINES (COSMETIC) FAT HARVESTED FROM ABDOMINE;  Surgeon: Marybelle Killings, MD;  Location: [facility] OR;  Service: Plastics;  Laterality: N/A;  Lip    FACELIFT, (RHYTIDECTOMY) (COSMETIC) Bilateral 09/18/2020    Procedure: FACELIFT INCLUDED NECKLIFT  (COSMETIC);  Surgeon: Marybelle Killings, MD;  Location: [facility] SUR[facility] OR;  Service: Plastics;  Laterality: Bilateral;    LUMPECTOMY Left 2011    VARICOSE VEIN SURGERY  10/06/2001    varicose vein stripping       Family History:     Family History   Problem Relation Age of Onset    Heart disease Mother     Asthma Daughter         OLDEST       Social History:     Social History     Socioeconomic History    Marital status: Divorced     Spouse name: Not on file    Number of children: Not on file    Years of education: Not on file    Highest education level: Not on file   Occupational History    Not on file   Tobacco Use    Smoking status: Never    Smokeless tobacco: Never   Vaping Use    Vaping Use: Never used   Substance and Sexual Activity    Alcohol use: Yes     Alcohol/week: 6.0 standard drinks     Types: 6 Standard drinks or equivalent per week    Drug use: Not Currently    Sexual activity: Not on file   Other Topics Concern    Not on  file   Social History Narrative    Not on file     Social Determinants of Health     Financial Resource Strain: Not on file   Food Insecurity: Not on file   Transportation Needs: Not on file   Physical Activity: Not on file   Stress: Not on file   Social Connections: Not on file   Intimate Partner Violence: Not on file   Housing Stability: Not on file       Allergies:   No Known Allergies    Medications:     Medications Prior to Admission   Medication Sig    zolpidem (AMBIEN CR) 12.5 MG CR tablet Take 12.5 mg by mouth nightly as needed    scopolamine (Transderm-Scop, 1.5 MG,) Place 1 patch onto the skin every third day       Review of Systems:   A comprehensive review of systems was: negative    Physical Exam:     Vitals:    02/26/21 0717   BP: 131/72   Pulse: 64   Resp: 16   Temp: 97.9 F (36.6 C)   SpO2: 100%       Intake and Output Summary (Last 24 hours) at Date Time  No intake or output data in the 24 hours ending 02/26/21 0747  Gen: NAD  Resp: unlabored  CV: RRR  Abd: soft,  NT, ND  Excess skin and adiposity above and below the umbilicus  Ext: excess adiposity of the medials thighs and above the knees        Labs:     Results       ** No results found for the last 24 hours. **            Rads:   Radiological Procedure reviewed.    Signed by: Lilli Light. Dorna Bloom

## 2021-02-26 NOTE — Anesthesia Postprocedure Evaluation (Signed)
Anesthesia Post Evaluation    Patient: Melinda Brown    Procedure(s):  ABDOMINOPLASTY-EXTENDED WITH LIPOSUCTION TO FLANKS  BILATERAL INNER THIGH AND BILATERAL KNEE LIPOSUCTION(COSMETIC)    Anesthesia type: general    Last Vitals:   Vitals Value Taken Time   BP 130/80 02/26/21 1150   Temp 36.6 C (97.8 F) 02/26/21 1200   Pulse 66 02/26/21 1150   Resp 13 02/26/21 1150   SpO2 98 % 02/26/21 1200                 Anesthesia Post Evaluation:     Patient Evaluated: PACU  Patient Participation: complete - patient participated  Level of Consciousness: awake and alert  Pain Score: 6  Pain Management: adequate    Airway Patency: patent    Anesthetic complications: No      PONV Status: none    Cardiovascular status: acceptable  Respiratory status: acceptable  Hydration status: acceptable        Signed by: Florestine Avers, MD, 02/26/2021 12:16 PM

## 2021-02-26 NOTE — Transfer of Care (Signed)
Anesthesia Transfer of Care Note    Patient: Melinda Brown    Procedures performed: Procedure(s):  ABDOMINOPLASTY-EXTENDED WITH LIPOSUCTION TO FLANKS  BILATERAL INNER THIGH AND BILATERAL KNEE LIPOSUCTION(COSMETIC)    Anesthesia type: General ETT    Patient location:Phase I PACU    Last vitals:   Vitals:    02/26/21 1133   BP: 108/73   Pulse: 67   Resp: 12   Temp: 36.6 C (97.8 F)   SpO2: 100%       Post pain: Patient not complaining of pain, continue current therapy      Mental Status:awake    Respiratory Function: tolerating face mask    Cardiovascular: stable    Nausea/Vomiting: patient not complaining of nausea or vomiting    Hydration Status: adequate    Post assessment: no apparent anesthetic complications and no reportable events    Signed by: Artis Delay, CRNA  02/26/21 11:34 AM

## 2021-02-27 ENCOUNTER — Encounter: Payer: Self-pay | Admitting: Plastic Surgery

## 2021-05-08 IMAGING — MR MR CERVICAL SPINE W/O CM
5 series · 33 of 48 positions shown · non-contrast
Comparison: Prior MRI from 05/08/2018.

CLINICAL DATA: Initial evaluation for right arm and neck pain with
weakness, numbness in right hand. History of prior surgery.

EXAM:
MRI CERVICAL SPINE WITHOUT CONTRAST
TECHNIQUE: Multiplanar, multisequence MR imaging of the cervical spine was
performed. No intravenous contrast was administered.

[Series 3: T2 · sagittal · 3.0mm · 0.41mm/px · 6 of 18 slices shown (1 of 2)]
[im 1/18]
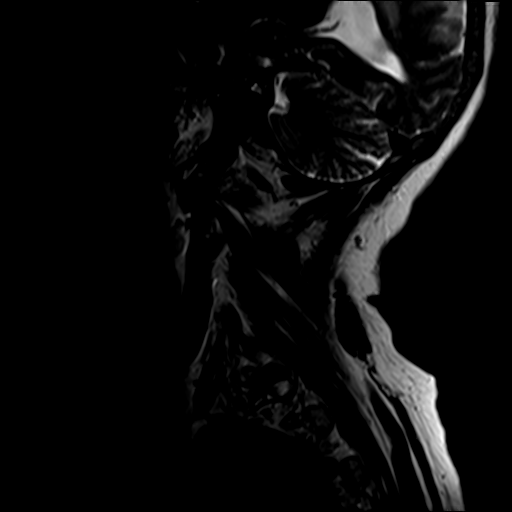
[im 4/18]
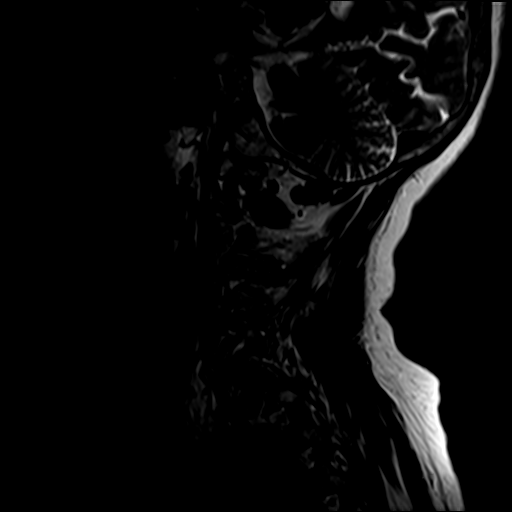
[im 7/18]
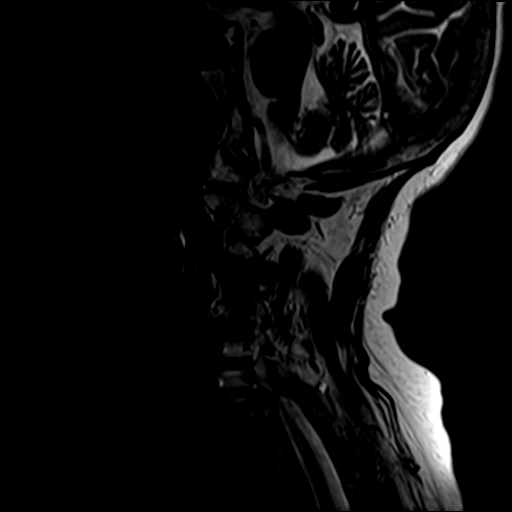
[im 11/18]
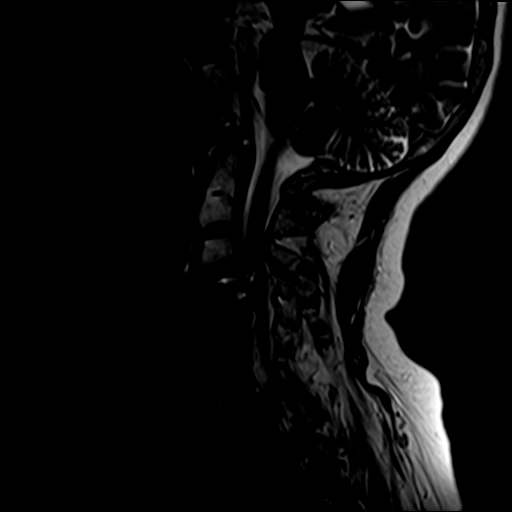
[im 14/18]
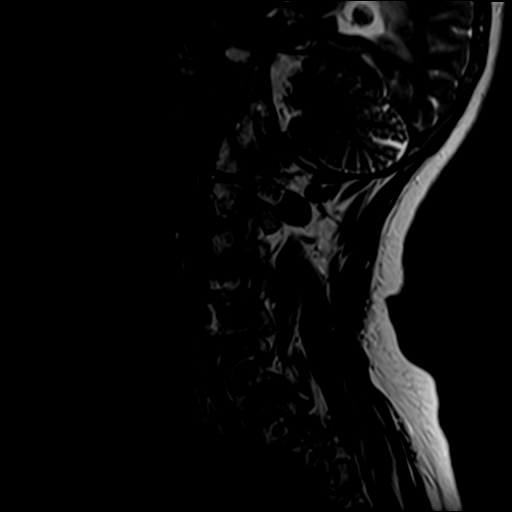
[im 18/18]
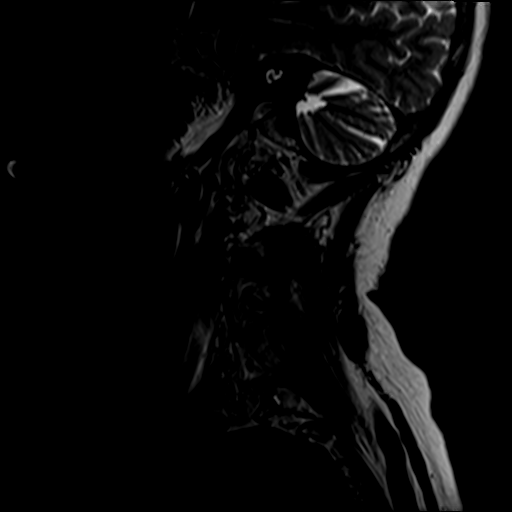

[Series 4: STIR · sagittal · 3.0mm · 0.82mm/px · 7 of 18 slices shown]
[im 1/18]
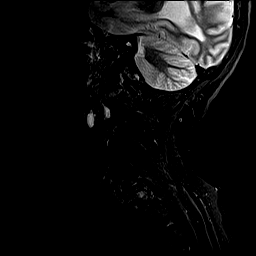
[im 3/18]
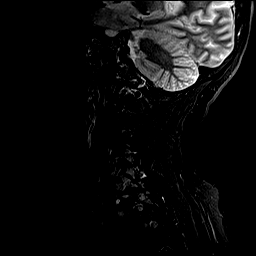
[im 6/18]
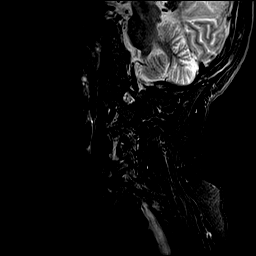
[im 9/18]
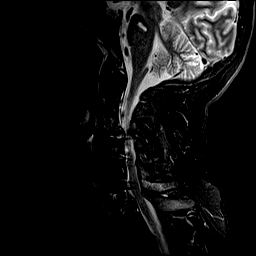
[im 12/18]
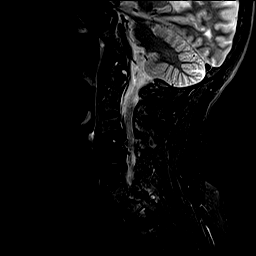
[im 15/18]
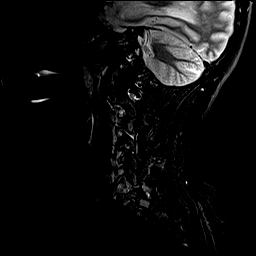
[im 18/18]
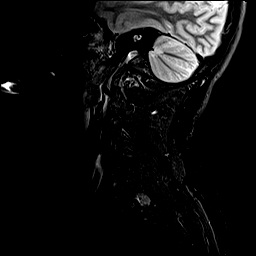

[Series 5: T1 · sagittal · 3.0mm · 0.82mm/px · 7 of 18 slices shown]
[im 1/18]
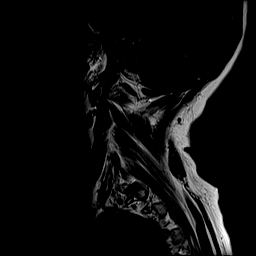
[im 3/18]
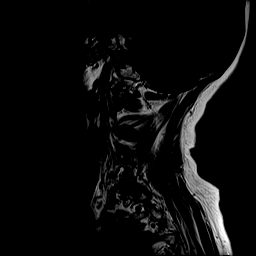
[im 6/18]
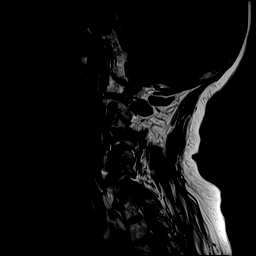
[im 9/18]
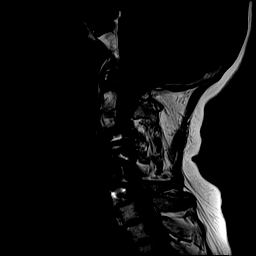
[im 12/18]
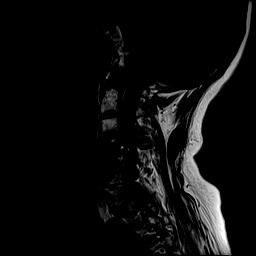
[im 15/18]
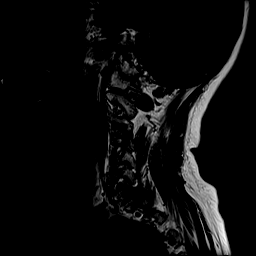
[im 18/18]
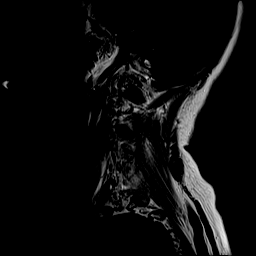

[Series 6: T2 · axial · 3.0mm · 0.70mm/px · z∈[-123,-5]mm · 8 of 35 slices shown (2 of 2)]
[im 1/35]
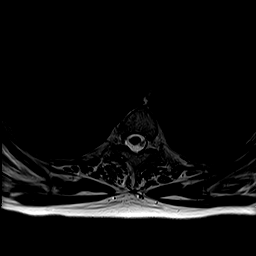
[im 6/35]
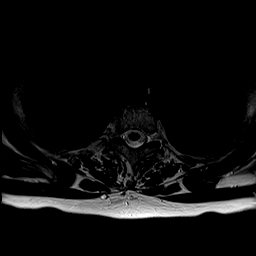
[im 11/35]
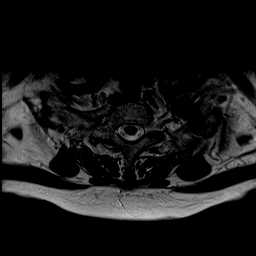
[im 16/35]
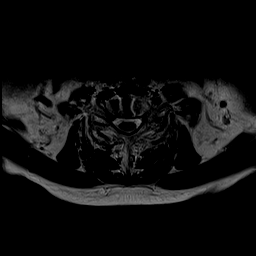
[im 19/35]
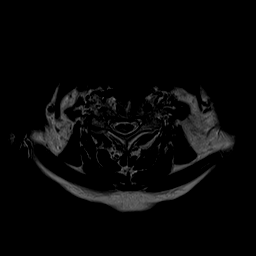
[im 24/35]
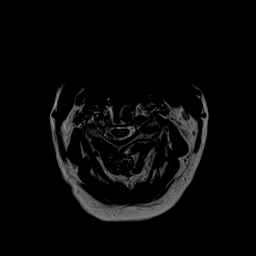
[im 29/35]
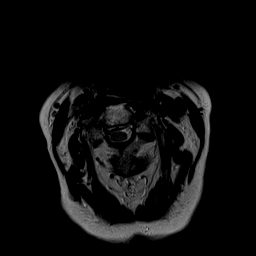
[im 35/35]
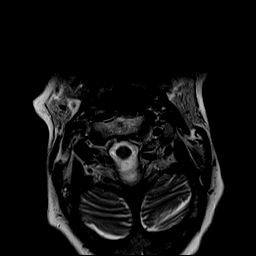

[Series 7: GRE · axial · 3.0mm · 0.35mm/px · z∈[-123,-61]mm · 5 of 35 slices shown]
[im 1/35]
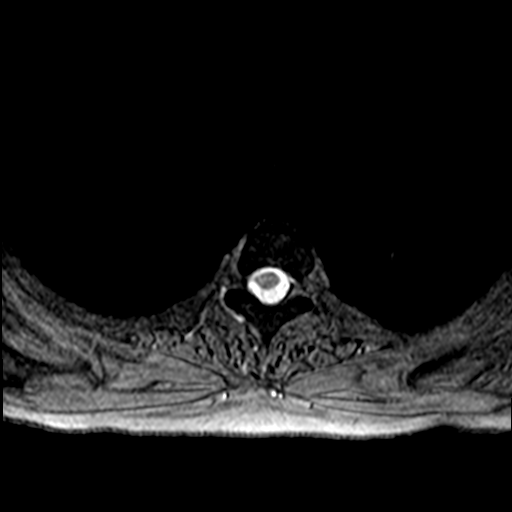
[im 6/35]
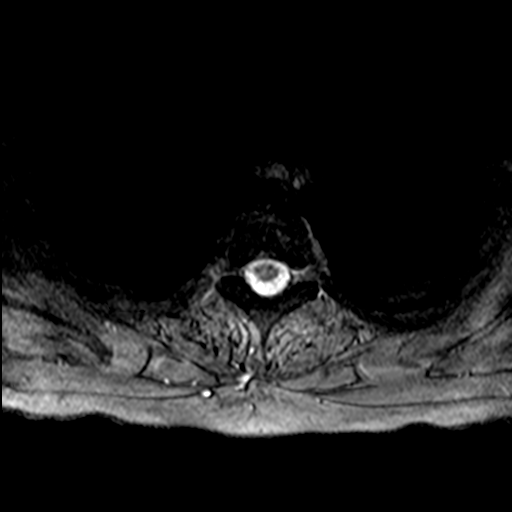
[im 11/35]
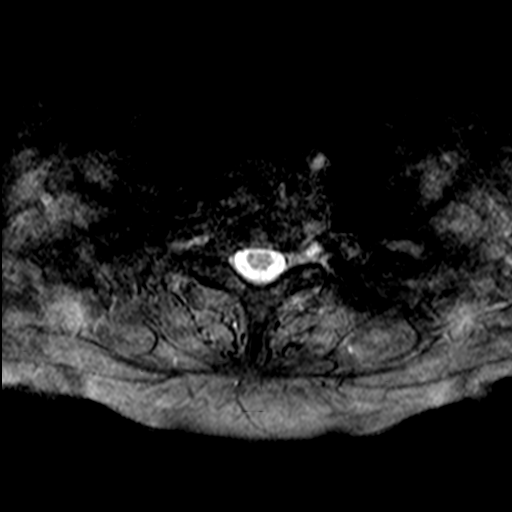
[im 16/35]
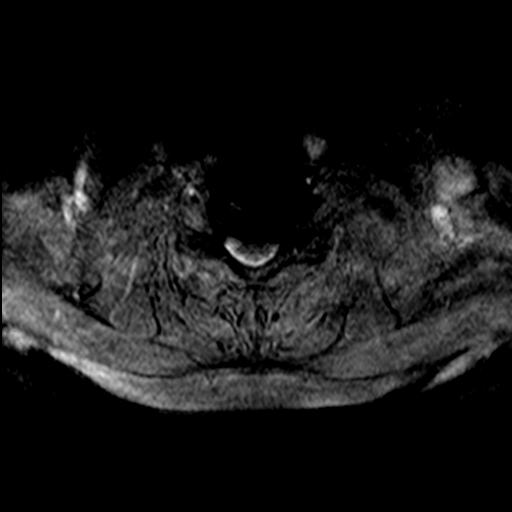
[im 19/35]
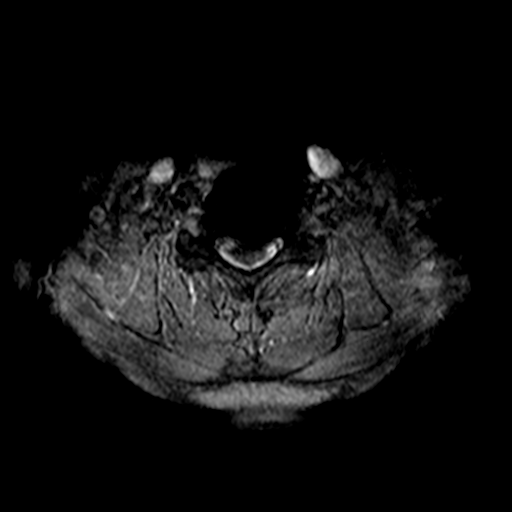

[33 of 48 positions shown; findings below may reference images not displayed]

FINDINGS: Alignment: Dextroscoliosis. Straightening with mild reversal of the
normal cervical lordosis. 3 mm anterolisthesis of C3 on C4, stable.
5 mm anterolisthesis of C7 on T1, facet mediated and progressed from
prior.

Vertebrae: Susceptibility artifact related to prior fusion at C4
through C7. Vertebral body height maintained without acute or
interval fracture. Bone marrow signal intensity within normal
limits. No worrisome osseous lesions. Reactive marrow edema seen
about multiple facets throughout the cervical spine due to osteo
arthritis, progressed from previous. Changes most pronounced about
the right C7-T1 facet (series 4, image 5).

Cord: Signal intensity within the cervical spinal cord is within
normal limits. She

Posterior Fossa, vertebral arteries, paraspinal tissues: Remote
pontine lacunar infarct noted, new from prior. Visualized brain and
posterior fossa otherwise unremarkable. Craniocervical junction
within normal limits. Paraspinous and prevertebral soft tissues
within normal limits. Normal flow voids seen within the vertebral
arteries bilaterally.

Disc levels:

C2-C3: Minimal disc bulge.  No canal or foraminal stenosis.

C3-C4: 3 mm anterolisthesis. Diffuse disc bulge with bilateral
uncovertebral hypertrophy. Severe left with mild right facet
hypertrophy. Mild to moderate spinal stenosis with mild cord
flattening. Moderate left worse than right C4 foraminal stenosis.

C4-C5: Prior fusion. Posterior endplate osseous ridging mildly
flattens the ventral thecal sac without significant residual spinal
stenosis. Foramina remain patent.

C5-C6: Prior fusion. No residual spinal stenosis. Residual
uncovertebral hypertrophy with moderate right worse than left C6
foraminal narrowing.

C6-C7: Prior fusion. No residual spinal stenosis. Residual
left-sided uncovertebral hypertrophy with moderate left C7 foraminal
narrowing. Right neural foramen remains widely patent.

C7-T1: Progressive 5 mm anterolisthesis. Associated broad posterior
pseudo disc bulge/uncovering with severe bilateral facet arthrosis.
6 mm synovial cyst noted at the posterior aspect of the right C7-T1
facet (series 4, image 5). Mild spinal stenosis without cord
impingement. Moderate bilateral C8 foraminal narrowing.

Visualized upper thoracic spine demonstrates no significant finding.
IMPRESSION: 1. Progressive 5 mm anterolisthesis of C7 on T1 with associated
severe facet arthrosis, resulting in mild canal with moderate
bilateral C8 foraminal stenosis.
2. Progressive facet osteoarthritis throughout the cervical spine
with reactive marrow edema at multiple levels, most pronounced at
C7-T1 on the right. Finding could contribute to underlying neck
pain.
3. 3 mm anterolisthesis of C3 on C4 with associated disc bulge and
uncovertebral disease, resulting in mild to moderate canal with
moderate left worse than right C4 foraminal stenosis.
4. Prior fusion at C4 through C7 without residual spinal stenosis.

## 2021-06-04 ENCOUNTER — Telehealth (INDEPENDENT_AMBULATORY_CARE_PROVIDER_SITE_OTHER): Payer: Self-pay | Admitting: Family Medicine

## 2021-06-04 ENCOUNTER — Ambulatory Visit (INDEPENDENT_AMBULATORY_CARE_PROVIDER_SITE_OTHER): Payer: BLUE CROSS/BLUE SHIELD | Admitting: Family Medicine

## 2021-06-04 ENCOUNTER — Encounter (INDEPENDENT_AMBULATORY_CARE_PROVIDER_SITE_OTHER): Payer: Self-pay | Admitting: Family Medicine

## 2021-06-04 VITALS — BP 128/72 | HR 66 | Temp 98.0°F | Wt 189.0 lb

## 2021-06-04 DIAGNOSIS — Z01818 Encounter for other preprocedural examination: Secondary | ICD-10-CM

## 2021-06-04 DIAGNOSIS — R1031 Right lower quadrant pain: Secondary | ICD-10-CM | POA: Insufficient documentation

## 2021-06-04 DIAGNOSIS — T753XXA Motion sickness, initial encounter: Secondary | ICD-10-CM

## 2021-06-04 DIAGNOSIS — E782 Mixed hyperlipidemia: Secondary | ICD-10-CM

## 2021-06-04 DIAGNOSIS — H269 Unspecified cataract: Secondary | ICD-10-CM

## 2021-06-04 DIAGNOSIS — Z6830 Body mass index (BMI) 30.0-30.9, adult: Secondary | ICD-10-CM

## 2021-06-04 MED ORDER — PHENTERMINE HCL 37.5 MG PO CAPS
37.5000 mg | ORAL_CAPSULE | Freq: Every morning | ORAL | 0 refills | Status: AC
Start: 2021-06-04 — End: ?

## 2021-06-04 MED ORDER — SCOPOLAMINE 1 MG/3DAYS TD PT72
1.0000 | MEDICATED_PATCH | TRANSDERMAL | 0 refills | Status: AC
Start: 2021-06-04 — End: 2021-06-18

## 2021-06-04 NOTE — Telephone Encounter (Signed)
Please fax printed preoperative medical clearance note, ECG, and lab reports to ophthalmologist Dr. Bjorn Loser

## 2021-06-04 NOTE — Progress Notes (Signed)
Subjective:      Patient ID: Melinda Brown is a 60 y.o. female     Chief Complaint   Patient presents with    Results     Previous bw results from 01/2021  Pt is also going on a cruise on 06/16/2020 and would like to make sure all vaccines are up to date  headaches        HPI     60 yo female for preoperative medical clearance at the request of Dr. Donnald Garre for bilateral cataract surgery .Marland KitchenMarland KitchenGYN: [name] Prescription medication: Zolpidem CR 12.5 mg as needed, Phentermine 37.5 mg as needed no known drug allergy No tobacco/daily alcohol Past medical history: Insomnia/restless leg syndrome, creatinine 0.04 greater than reference range Past surgical history: Appendectomy, (benign) breast lumpectomy, (multiple) cosmetic  Cardiac risk factor: Female greater than 55, BMI greater than 30, hyperlipidemia (LDL, bord TG) family history: No colon cancer Tdap 09/24/2016, Shingrix (02/16/2019, 04/29/2019), Pfizer (09/04/2019, 09/27/19, 04/09/20, influenza 04/29/21, bivalent 04/29/21) Colonoscopy reportedly negative... Mammography 2021 benign    Review of Systems   Constitutional: Negative.    HENT: Negative.     Eyes:  Positive for visual disturbance.   Respiratory: Negative.     Cardiovascular: Negative.    Gastrointestinal: Negative.    Endocrine: Negative.    Genitourinary: Negative.    Musculoskeletal: Negative.    Skin: Negative.    Allergic/Immunologic: Negative.    Neurological:  Positive for headaches (2-year history intermittent mid-frontal).   Hematological: Negative.    Psychiatric/Behavioral: Negative.     All other systems reviewed and are negative.       BP 128/72    Pulse 66    Temp 98 F (36.7 C)    Wt 85.7 kg (189 lb)    LMP  (LMP Unknown)    SpO2 100%    BMI 30.51 kg/m     Objective:     Physical Exam  Vitals and nursing note reviewed.   Constitutional:       Appearance: Normal appearance. She is normal weight.   HENT:      Head: Normocephalic and atraumatic.      Right Ear: External ear  normal.      Left Ear: External ear normal.   Eyes:      Extraocular Movements: Extraocular movements intact.      Conjunctiva/sclera: Conjunctivae normal.      Pupils: Pupils are equal, round, and reactive to light.      Comments: Bilateral cataracts   Cardiovascular:      Rate and Rhythm: Normal rate and regular rhythm.      Pulses: Normal pulses.      Heart sounds: Normal heart sounds.   Pulmonary:      Effort: Pulmonary effort is normal.      Breath sounds: Normal breath sounds.   Abdominal:      General: Abdomen is flat. Bowel sounds are normal.      Palpations: Abdomen is soft.   Musculoskeletal:         General: Normal range of motion.      Cervical back: Normal range of motion and neck supple.   Skin:     General: Skin is warm and dry.   Neurological:      General: No focal deficit present.      Mental Status: She is alert and oriented to person, place, and time. Mental status is at baseline.   Psychiatric:  Mood and Affect: Mood normal.         Behavior: Behavior normal.         Thought Content: Thought content normal.         Judgment: Judgment normal.      Assessment:     1. Preop examination    2. Cataract of both eyes, unspecified cataract type    3. Mixed hyperlipidemia    4. Motion sickness, initial encounter  - scopolamine (TRANSDERM-SCOP); Place 1 patch onto the skin every third day for 14 days Use patches for the duration of your trip  Dispense: 5 patch; Refill: 0    5. BMI 30.0-30.9,adult  - phentermine 37.5 MG capsule; Take 1 capsule (37.5 mg) by mouth every morning  Dispense: 30 capsule; Refill: 0        Plan:     See assessment/orders Cleared for surgery Fax preoperative medical clearance note, ECG/lab reports from 02/04/2021 to Dr. Barrett Shell, MD

## 2021-06-05 NOTE — Telephone Encounter (Signed)
Confirmation of fax received and placed in scan bin.

## 2021-06-05 NOTE — Telephone Encounter (Signed)
Pre-op notes, EKG, and labs faxed to Dr. Bjorn Loser @ [phone] on 06/05/21. Awaiting confirmation of fax.

## 2021-06-10 ENCOUNTER — Telehealth (INDEPENDENT_AMBULATORY_CARE_PROVIDER_SITE_OTHER): Payer: Self-pay | Admitting: Family Medicine

## 2021-06-10 NOTE — Telephone Encounter (Signed)
Pt is dropping off a pre op     For plescia to sign    Due date/urgency-as soon as possible     Form left in team b inbox    Patient states that results from previous pre op visit on 02/04/21 and based on the form results from the previous 12 months will suffice.     Attached copy of form to task     Please advise if patient needs another appt.    Please assess and call back with update.      Recent Visits  Date Type Provider Dept   06/04/21 Office Visit Plescia, Sherre Lain, MD [facility]   02/04/21 Office Visit Plescia, Sherre Lain, MD [facility]   Showing recent visits within past 270 days and meeting all other requirements  Future Appointments  No visits were found meeting these conditions.  Showing future appointments within next 90 days and meeting all other requirements

## 2021-06-11 ENCOUNTER — Other Ambulatory Visit (INDEPENDENT_AMBULATORY_CARE_PROVIDER_SITE_OTHER): Payer: Self-pay | Admitting: Family Medicine

## 2021-06-11 DIAGNOSIS — G4700 Insomnia, unspecified: Secondary | ICD-10-CM

## 2021-06-13 NOTE — Telephone Encounter (Signed)
Pre-op NOT needed, pt advised she had one done in December; faxing over H & P and EKG to [facility] [phone]

## 2021-08-01 ENCOUNTER — Ambulatory Visit
Admission: EM | Admit: 2021-08-01 | Discharge: 2021-08-01 | Disposition: A | Discharge status: Home / Self Care | Payer: Medicaid Other | Attending: Student | Admitting: Student

## 2021-08-01 ENCOUNTER — Other Ambulatory Visit: Payer: Self-pay

## 2021-08-01 DIAGNOSIS — T7840XA Allergy, unspecified, initial encounter: Secondary | ICD-10-CM

## 2021-08-01 MED ORDER — PREDNISONE 20 MG PO TABS: 40.0000 mg | ORAL_TABLET | Freq: Every day | ORAL | 0 refills | Status: AC

## 2021-08-01 NOTE — Discharge Instructions (Addendum)
-  Prednisone, 2 pills taken at the same time for 5 days in a row.  Try taking this earlier in the day as it can give you energy. Avoid NSAIDs like ibuprofen and alleve while taking this medication as they can increase your risk of stomach upset and even GI bleeding when in combination with a steroid. You can continue tylenol (acetaminophen) up to 1000mg  3x daily. -Benedryl (diphenhydramine) 25-50mg  (1-2 pills) as needed for itching, up to every 6 hours.  This medication will cause drowsiness. -Follow-up if symptoms worsen - facial swelling, shortness of breath, sensation of throat closing, dizzines, chest pain, etc.

## 2021-08-01 NOTE — ED Provider Notes (Signed)
Lynn Black    CSN: [id number] Arrival date & time: 08/01/21  1522      History   Chief Complaint No chief complaint on file.   HPI Lynn Black is a 61 y.o. female presenting with rash x2 days. History psoriasis, pruritis, eczema. Describes pruritic rash x2 days, primarily on the face and hands. States she has multiple allergies but denies known contact with any of them. Denies new products, outdoor exposure. Denies history anaphylaxis. States rash is painful and pruritic. Benedryl did not provide relief. Denies SOB, dizziness, sensation of throat closing, CP.    HPI  Past Medical History:  Diagnosis Date   Eczema    GERD (gastroesophageal reflux disease)    Heart murmur    " nothing to wory about"   Pruritus    Psoriasis    Seasonal allergies    Smoking    Vitamin B12 deficiency 05/2019   Vitamin D deficiency 05/2019    Patient Active Problem List   Diagnosis Date Noted   Cervical spondylosis with myelopathy and radiculopathy 10/24/2020   Hypertension 09/12/2018   Tobacco dependence 10/15/2017   Pruritus 10/15/2017   Neuropathic pain 10/15/2017   Psoriasis 03/16/2017   Cervical arthritis with myelopathy 10/26/2016    Past Surgical History:  Procedure Laterality Date   ANTERIOR CERVICAL DECOMP/DISCECTOMY FUSION N/A 10/26/2016   Procedure: Cervical four-five, Cervical five-six  Anterior cervical decompression/discectomy/fusion;  Surgeon: Kristeen Miss, MD;  Location: [facility];  Service: Neurosurgery;  Laterality: N/A;   ANTERIOR CERVICAL DECOMP/DISCECTOMY FUSION N/A 10/24/2020   Procedure: Cervical three-four, Cervical seven-Thoracic one Anterior cervical decompression/discectomy fusion;  Surgeon: Kristeen Miss, MD;  Location: [facility];  Service: Neurosurgery;  Laterality: N/A;   CERVICAL DISCECTOMY     ROTATOR CUFF REPAIR Right     OB History     Gravida  1   Para  0   Term  0   Preterm  0   AB  0   Living         SAB  0   IAB  0   Ectopic   0   Multiple      Live Births               Home Medications    Prior to Admission medications   Medication Sig Start Date End Date Taking? Authorizing Provider  predniSONE (DELTASONE) 20 MG tablet Take 2 tablets (40 mg total) by mouth daily for 5 days. Take with breakfast or lunch. Avoid NSAIDs (ibuprofen, etc) while taking this medication. 08/01/21 08/06/21 Yes Hazel Sams, PA-C  cetirizine (ZYRTEC ALLERGY) 10 MG tablet Take 1 tablet (10 mg total) by mouth daily. 01/10/21   Hughie Closs, PA-C  clobetasol ointment (TEMOVATE) [id number] % Apply 1 application topically 2 (two) times daily as needed for rash. 09/18/20   [provider]  famotidine (PEPCID) 20 MG tablet Take 1 tablet (20 mg total) by mouth 2 (two) times daily. 01/10/21   Hughie Closs, PA-C  gabapentin (NEURONTIN) 300 MG capsule Take 300 mg by mouth at bedtime as needed for pain. 09/10/20   [provider]  methocarbamol (ROBAXIN) 500 MG tablet Take 1 tablet (500 mg total) by mouth every 6 (six) hours as needed for muscle spasms. 10/25/20   Kristeen Miss, MD  oxyCODONE-acetaminophen (PERCOCET/ROXICET) 5-325 MG tablet Take 1-2 tablets by mouth every 4 (four) hours as needed for moderate pain or severe pain. 10/25/20   Kristeen Miss, MD  traZODone (  DESYREL) 50 MG tablet Take 0.5-1 tablets (25-50 mg total) by mouth at bedtime as needed for sleep. Patient taking differently: Take 50 mg by mouth at bedtime. 08/21/20   Azzie Glatter, FNP  triamcinolone (KENALOG) 0.1 % APPLY 1 APPLICATION TOPICALLY 2 TIMES DAILY. Patient taking differently: Apply 1 application topically 2 (two) times daily as needed (rash). 08/21/20   Azzie Glatter, FNP    Family History Family History  Family history unknown: Yes    Social History Social History   Tobacco Use   Smoking status: Every Day    Packs/day: 0.50    Years: 30.00    Pack years: 15.00    Types: Cigarettes   Smokeless tobacco: Never  Vaping Use   Vaping  Use: Never used  Substance Use Topics   Alcohol use: Yes    Alcohol/week: 6.0 standard drinks    Types: 6 Glasses of wine per week   Drug use: No     Allergies   Nickel, Shellfish allergy, Sulfa antibiotics, Other, Contrast media [iodinated contrast media], Onion, Tomato, Latex, and Neosporin [neomycin-bacitracin zn-polymyx]   Review of Systems Review of Systems  Skin:  Positive for rash.  All other systems reviewed and are negative.   Physical Exam Triage Vital Signs ED Triage Vitals  Enc Vitals Group     BP      Pulse      Resp      Temp      Temp src      SpO2      Weight      Height      Head Circumference      Peak Flow      Pain Score      Pain Loc      Pain Edu?      Excl. in [place]?    No data found.  Updated Vital Signs BP (!) 152/82    Pulse (!) 101    Temp 98.1 F (36.7 C)    Resp 18    SpO2 99%   Visual Acuity Right Eye Distance:   Left Eye Distance:   Bilateral Distance:    Right Eye Near:   Left Eye Near:    Bilateral Near:     Physical Exam Vitals reviewed.  Constitutional:      General: She is not in acute distress.    Appearance: Normal appearance. She is not ill-appearing or diaphoretic.  HENT:     Head: Normocephalic and atraumatic.  Cardiovascular:     Rate and Rhythm: Normal rate and regular rhythm.     Heart sounds: Normal heart sounds.  Pulmonary:     Effort: Pulmonary effort is normal.     Breath sounds: Normal breath sounds.  Skin:    General: Skin is warm.     Comments: Erythematous and inflamed skin cheeks, chin, palms. No warmth, induration, discharge. No facial, lip, tongue, uvula swelling; airway patent.   Neurological:     General: No focal deficit present.     Mental Status: She is alert and oriented to person, place, and time.  Psychiatric:        Mood and Affect: Mood normal.        Behavior: Behavior normal.        Thought Content: Thought content normal.        Judgment: Judgment normal.     UC Treatments  / Results  Labs (all labs ordered are listed, but only abnormal results are  displayed) Labs Reviewed - No data to display  EKG   Radiology No results found.  Procedures Procedures (including critical care time)  Medications Ordered in UC Medications - No data to display  Initial Impression / Assessment and Plan / UC Course  I have reviewed the triage vital signs and the nursing notes.  Pertinent labs & imaging results that were available during my care of the patient were reviewed by me and considered in my medical decision making (see chart for details).     This patient is a very pleasant 61 y.o. year old female presenting with allergic rash. Long history similar rashes. She declines Solumedrol IM in favor of Prednisone PO. Sent. Also rec benedryl. ED return precautions discussed. Patient verbalizes understanding and agreement.  .   Final Clinical Impressions(s) / UC Diagnoses   Final diagnoses:  Allergic rash present on examination     Discharge Instructions      -Prednisone, 2 pills taken at the same time for 5 days in a row.  Try taking this earlier in the day as it can give you energy. Avoid NSAIDs like ibuprofen and alleve while taking this medication as they can increase your risk of stomach upset and even GI bleeding when in combination with a steroid. You can continue tylenol (acetaminophen) up to 1000mg  3x daily. -Benedryl (diphenhydramine) 25-50mg  (1-2 pills) as needed for itching, up to every 6 hours.  This medication will cause drowsiness. -Follow-up if symptoms worsen - facial swelling, shortness of breath, sensation of throat closing, dizzines, chest pain, etc.      ED Prescriptions     Medication Sig Dispense Auth. Provider   predniSONE (DELTASONE) 20 MG tablet Take 2 tablets (40 mg total) by mouth daily for 5 days. Take with breakfast or lunch. Avoid NSAIDs (ibuprofen, etc) while taking this medication. 10 tablet Hazel Sams, PA-C      PDMP not  reviewed this encounter.   Jahia, Diluzio, PA-C 08/01/21 1546

## 2021-09-04 ENCOUNTER — Telehealth (INDEPENDENT_AMBULATORY_CARE_PROVIDER_SITE_OTHER): Payer: Self-pay

## 2021-09-04 ENCOUNTER — Encounter (INDEPENDENT_AMBULATORY_CARE_PROVIDER_SITE_OTHER): Payer: Self-pay

## 2021-09-04 NOTE — Progress Notes (Signed)
Zolpidem Tartrate ER 12.5MG  er tablets  KeyLarwance Rote   PA Case ID: [id number]   Rx #: [id number]  Prior authorization submitted using Covermymeds  APPROVED  08/05/2021 through 09/04/2022     Patient was notified using MyChartMsg

## 2021-09-04 NOTE — Telephone Encounter (Signed)
Zolpidem Tartrate ER 12.5MG er tablets  Key: [id number]   PA Case ID: [id number]   Rx #: [id number]  Prior authorization submitted using Covermymeds  APPROVED  08/05/2021 through 09/04/2022     Patient was notified using MyChartMsg

## 2021-10-20 ENCOUNTER — Ambulatory Visit (INDEPENDENT_AMBULATORY_CARE_PROVIDER_SITE_OTHER): Payer: BLUE CROSS/BLUE SHIELD | Admitting: Family Medicine

## 2021-10-20 ENCOUNTER — Encounter (INDEPENDENT_AMBULATORY_CARE_PROVIDER_SITE_OTHER): Payer: Self-pay | Admitting: Family Medicine

## 2021-10-20 VITALS — BP 108/82 | HR 88 | Temp 98.4°F | Ht 66.0 in | Wt 190.6 lb

## 2021-10-20 DIAGNOSIS — Z6830 Body mass index (BMI) 30.0-30.9, adult: Secondary | ICD-10-CM

## 2021-10-20 DIAGNOSIS — E782 Mixed hyperlipidemia: Secondary | ICD-10-CM

## 2021-10-20 DIAGNOSIS — Z0000 Encounter for general adult medical examination without abnormal findings: Secondary | ICD-10-CM

## 2021-10-20 MED ORDER — SEMAGLUTIDE(0.25 OR 0.5MG/DOS) 2 MG/1.5ML SC SOPN
0.2500 mg | PEN_INJECTOR | SUBCUTANEOUS | 0 refills | Status: DC
Start: 2021-10-20 — End: 2021-11-12

## 2021-10-20 NOTE — Progress Notes (Signed)
Subjective:      Patient ID: Melinda Brown is a 61 y.o. female     Chief Complaint   Patient presents with    Annual Exam     Patient wants to discuss her cholesterol and would like to get Rx for phentermine     Due:   Hepatitis C Screening   Colorectal cancer screening  Mammogram         HPI     61 yo female for [employer]...GYN: [name]. Prescription medication: Zolpidem CR 12.5 mg as needed, Phentermine 37.5 mg as needed, to add Ozempic 0.25 mg weekly. no known drug allergy. No tobacco/daily alcohol. Past medical history: Insomnia/restless leg syndrome. Past surgical history: OU cataract, Appendectomy, (benign) breast lumpectomy, (multiple) cosmetic . Cardiac risk factor: Female greater than 55, BMI greater than 30, hyperlipidemia (LDL, bord TG). family history: No colon cancer. Tdap 09/24/2016, Shingrix (02/16/2019, 04/29/2019), Pfizer (09/04/2019, 09/27/19, 04/09/20, bivalent 04/29/21), influenza 04/29/21. Colonoscopy reportedly negative... Mammography 2021 benign    Review of Systems   Constitutional:  Positive for unexpected weight change (gain).   HENT: Negative.     Eyes: Negative.    Respiratory: Negative.     Cardiovascular: Negative.    Gastrointestinal: Negative.    Endocrine: Negative.    Genitourinary: Negative.    Musculoskeletal: Negative.    Skin: Negative.    Allergic/Immunologic: Negative.    Neurological: Negative.    Hematological: Negative.    Psychiatric/Behavioral: Negative.     All other systems reviewed and are negative.         BP 108/82 (BP Site: Left arm, Patient Position: Sitting, Cuff Size: Medium)   Pulse 88   Temp 98.4 F (36.9 C) (Tympanic)   Ht 1.676 m (5\' 6" )   Wt 86.5 kg (190 lb 9.6 oz)   LMP  (LMP Unknown)   SpO2 99%   BMI 30.76 kg/m     Objective:     Physical Exam  Vitals and nursing note reviewed.   Constitutional:       Appearance: Normal appearance.   HENT:      Head: Normocephalic and atraumatic.      Right Ear: External ear normal.      Left Ear: External ear normal.    Eyes:      Extraocular Movements: Extraocular movements intact.      Conjunctiva/sclera: Conjunctivae normal.      Pupils: Pupils are equal, round, and reactive to light.   Neck:      Vascular: No carotid bruit.   Cardiovascular:      Rate and Rhythm: Normal rate and regular rhythm.      Pulses: Normal pulses.      Heart sounds: Normal heart sounds.   Pulmonary:      Effort: Pulmonary effort is normal.      Breath sounds: Normal breath sounds.   Abdominal:      General: Abdomen is flat. Bowel sounds are normal.      Palpations: Abdomen is soft.   Musculoskeletal:         General: Normal range of motion.      Cervical back: Normal range of motion and neck supple.   Skin:     General: Skin is warm and dry.   Neurological:      General: No focal deficit present.      Mental Status: She is alert and oriented to person, place, and time. Mental status is at baseline.   Psychiatric:  Mood and Affect: Mood normal.         Behavior: Behavior normal.         Thought Content: Thought content normal.         Judgment: Judgment normal.        Assessment:     1. Health care maintenance  - CBC and differential; Future  - Comprehensive metabolic panel; Future  - TSH; Future    2. BMI 30.0-30.9,adult  - Semaglutide,0.25 or 0.5MG /DOS, 2 MG/1.5ML Solution Pen-injector; Inject 0.25 mg into the skin once a week  Dispense: 1.5 mL; Refill: 0    3. Mixed hyperlipidemia  - Lipid panel; Future        Plan:     See assessment/orders. Discussed update colonoscopy minimum every 10 years, annual physical exam    Theresa Mulligan, MD

## 2021-10-21 ENCOUNTER — Other Ambulatory Visit (FREE_STANDING_LABORATORY_FACILITY): Payer: BLUE CROSS/BLUE SHIELD

## 2021-10-21 DIAGNOSIS — Z0000 Encounter for general adult medical examination without abnormal findings: Secondary | ICD-10-CM

## 2021-10-21 DIAGNOSIS — E782 Mixed hyperlipidemia: Secondary | ICD-10-CM

## 2021-10-21 LAB — CBC AND DIFFERENTIAL
Absolute NRBC: 0 10*3/uL (ref 0.00–0.00)
Basophils Absolute Automated: 0.07 10*3/uL (ref 0.00–0.08)
Basophils Automated: 1.5 %
Eosinophils Absolute Automated: 0.07 10*3/uL (ref 0.00–0.44)
Eosinophils Automated: 1.5 %
Hematocrit: 39.3 % (ref 34.7–43.7)
Hgb: 13.6 g/dL (ref 11.4–14.8)
Immature Granulocytes Absolute: 0.01 10*3/uL (ref 0.00–0.07)
Immature Granulocytes: 0.2 %
Instrument Absolute Neutrophil Count: 2.31 10*3/uL (ref 1.10–6.33)
Lymphocytes Absolute Automated: 1.73 10*3/uL (ref 0.42–3.22)
Lymphocytes Automated: 37.9 %
MCH: 31.1 pg (ref 25.1–33.5)
MCHC: 34.6 g/dL (ref 31.5–35.8)
MCV: 89.7 fL (ref 78.0–96.0)
MPV: 12.3 fL (ref 8.9–12.5)
Monocytes Absolute Automated: 0.38 10*3/uL (ref 0.21–0.85)
Monocytes: 8.3 %
Neutrophils Absolute: 2.31 10*3/uL (ref 1.10–6.33)
Neutrophils: 50.6 %
Nucleated RBC: 0 /100 WBC (ref 0.0–0.0)
Platelets: 171 10*3/uL (ref 142–346)
RBC: 4.38 10*6/uL (ref 3.90–5.10)
RDW: 13 % (ref 11–15)
WBC: 4.57 10*3/uL (ref 3.10–9.50)

## 2021-10-21 LAB — COMPREHENSIVE METABOLIC PANEL
ALT: 21 U/L (ref 0–55)
AST (SGOT): 23 U/L (ref 5–41)
Albumin/Globulin Ratio: 1.5 (ref 0.9–2.2)
Albumin: 4.1 g/dL (ref 3.5–5.0)
Alkaline Phosphatase: 89 U/L (ref 37–117)
Anion Gap: 7 (ref 5.0–15.0)
BUN: 16 mg/dL (ref 7.0–21.0)
Bilirubin, Total: 0.8 mg/dL (ref 0.2–1.2)
CO2: 26 mEq/L (ref 17–29)
Calcium: 9.3 mg/dL (ref 8.5–10.5)
Chloride: 103 mEq/L (ref 99–111)
Creatinine: 0.8 mg/dL (ref 0.4–1.0)
Globulin: 2.8 g/dL (ref 2.0–3.6)
Glucose: 93 mg/dL (ref 70–100)
Potassium: 4.9 mEq/L (ref 3.5–5.3)
Protein, Total: 6.9 g/dL (ref 6.0–8.3)
Sodium: 136 mEq/L (ref 135–145)
eGFR: >60 mL/min/{1.73_m2} (ref >=60)

## 2021-10-21 LAB — HEMOLYSIS INDEX: Hemolysis Index: 6 Index (ref 0–24)

## 2021-10-21 LAB — LIPID PANEL
Cholesterol / HDL Ratio: 3.3 Index
Cholesterol: 276 mg/dL — ABNORMAL HIGH (ref 0–199)
HDL: 83 mg/dL (ref 40–9999)
LDL Calculated: 175 mg/dL — ABNORMAL HIGH (ref 0–99)
Triglycerides: 91 mg/dL (ref 34–149)
VLDL Calculated: 18 mg/dL (ref 10–40)

## 2021-10-21 LAB — TSH: TSH: 1.08 u[IU]/mL (ref 0.35–4.94)

## 2021-11-12 ENCOUNTER — Other Ambulatory Visit (INDEPENDENT_AMBULATORY_CARE_PROVIDER_SITE_OTHER): Payer: Self-pay | Admitting: Family Medicine

## 2021-11-12 DIAGNOSIS — Z6830 Body mass index (BMI) 30.0-30.9, adult: Secondary | ICD-10-CM

## 2021-11-12 MED ORDER — SEMAGLUTIDE(0.25 OR 0.5MG/DOS) 2 MG/1.5ML SC SOPN
0.2500 mg | PEN_INJECTOR | SUBCUTANEOUS | 0 refills | Status: DC
Start: 2021-11-12 — End: 2022-01-30

## 2021-11-12 NOTE — Telephone Encounter (Signed)
Semaglutide,0.25 or 0.5MG /DOS, 2 MG/1.5ML Solution Pen-injector    Last OV - Last evaluated on 10/20/2021 for annual exam      Name of Pharmacy and Location - [facility] [id number] [place], [place] - [address] [address]      Dr.Plescia - Please review. Thank you!

## 2021-12-01 ENCOUNTER — Other Ambulatory Visit (INDEPENDENT_AMBULATORY_CARE_PROVIDER_SITE_OTHER): Payer: Self-pay | Admitting: Family Medicine

## 2021-12-01 DIAGNOSIS — Z6830 Body mass index (BMI) 30.0-30.9, adult: Secondary | ICD-10-CM

## 2021-12-15 ENCOUNTER — Other Ambulatory Visit (INDEPENDENT_AMBULATORY_CARE_PROVIDER_SITE_OTHER): Payer: Self-pay | Admitting: Family Medicine

## 2021-12-15 DIAGNOSIS — G4700 Insomnia, unspecified: Secondary | ICD-10-CM

## 2021-12-15 NOTE — Telephone Encounter (Signed)
90 tablets, 0 refill pending for zolpidem 12.5 mg.    Last OV on 10/20/21 for annual.    Name of Pharmacy and Location - [facility] [phone] - [address] [place], [place] - [address]    Dr. Christy Sartorius - Please review. Thank you!

## 2021-12-24 ENCOUNTER — Ambulatory Visit (INDEPENDENT_AMBULATORY_CARE_PROVIDER_SITE_OTHER): Payer: BLUE CROSS/BLUE SHIELD

## 2022-01-30 ENCOUNTER — Encounter (INDEPENDENT_AMBULATORY_CARE_PROVIDER_SITE_OTHER): Payer: Self-pay | Admitting: Family Medicine

## 2022-01-30 ENCOUNTER — Other Ambulatory Visit (INDEPENDENT_AMBULATORY_CARE_PROVIDER_SITE_OTHER): Payer: Self-pay | Admitting: Family Medicine

## 2022-01-30 DIAGNOSIS — Z6830 Body mass index (BMI) 30.0-30.9, adult: Secondary | ICD-10-CM

## 2022-01-30 MED ORDER — SEMAGLUTIDE(0.25 OR 0.5MG/DOS) 2 MG/1.5ML SC SOPN
0.5000 mg | PEN_INJECTOR | SUBCUTANEOUS | 1 refills | Status: DC
Start: 2022-01-30 — End: 2022-11-09

## 2022-03-08 ENCOUNTER — Ambulatory Visit
Admission: EM | Admit: 2022-03-08 | Discharge: 2022-03-08 | Disposition: A | Discharge status: Home / Self Care | Payer: Medicaid Other

## 2022-03-08 ENCOUNTER — Ambulatory Visit (INDEPENDENT_AMBULATORY_CARE_PROVIDER_SITE_OTHER): Payer: Medicaid Other

## 2022-03-08 DIAGNOSIS — M25532 Pain in left wrist: Secondary | ICD-10-CM

## 2022-03-08 DIAGNOSIS — W19XXXA Unspecified fall, initial encounter: Secondary | ICD-10-CM | POA: Diagnosis not present

## 2022-03-08 NOTE — ED Triage Notes (Signed)
Pt. States she fell yesterday morning and injured her left arm. Pt states her left arm has been swollen and painful.

## 2022-03-08 NOTE — ED Provider Notes (Addendum)
[facility]    CSN: [id number] Arrival date & time: 03/08/22  1316      History   Chief Complaint Chief Complaint  Patient presents with   Arm Injury    HPI Lynn Black is a 61 y.o. female.    Arm Injury   Presents to UC with complaint of left arm pain.  She localizes pain to her distal forearm through her wrist and metatarsals.  She reports a fall yesterday morning and since then the arm is swollen and painful.  Past Medical History:  Diagnosis Date   Eczema    GERD (gastroesophageal reflux disease)    Heart murmur    " nothing to wory about"   Pruritus    Psoriasis    Seasonal allergies    Smoking    Vitamin B12 deficiency 05/2019   Vitamin D deficiency 05/2019    Patient Active Problem List   Diagnosis Date Noted   Cervical spondylosis with myelopathy and radiculopathy 10/24/2020   Hypertension 09/12/2018   Tobacco dependence 10/15/2017   Pruritus 10/15/2017   Neuropathic pain 10/15/2017   Psoriasis 03/16/2017   Cervical arthritis with myelopathy 10/26/2016    Past Surgical History:  Procedure Laterality Date   ANTERIOR CERVICAL DECOMP/DISCECTOMY FUSION N/A 10/26/2016   Procedure: Cervical four-five, Cervical five-six  Anterior cervical decompression/discectomy/fusion;  Surgeon: Barnett Abu, MD;  Location: [facility];  Service: Neurosurgery;  Laterality: N/A;   ANTERIOR CERVICAL DECOMP/DISCECTOMY FUSION N/A 10/24/2020   Procedure: Cervical three-four, Cervical seven-Thoracic one Anterior cervical decompression/discectomy fusion;  Surgeon: Barnett Abu, MD;  Location: [facility];  Service: Neurosurgery;  Laterality: N/A;   CERVICAL DISCECTOMY     ROTATOR CUFF REPAIR Right     OB History     Gravida  1   Para  0   Term  0   Preterm  0   AB  0   Living         SAB  0   IAB  0   Ectopic  0   Multiple      Live Births               Home Medications    Prior to Admission medications   Medication Sig Start Date End Date  Taking? Authorizing Provider  alendronate (FOSAMAX) 70 MG tablet Take 70 mg by mouth once a week. 03/05/22   [provider]  azelastine (ASTELIN) 0.1 % nasal spray SMARTSIG:1-2 Spray(s) Both Nares Every 12 Hours PRN 02/10/22   [provider]  cetirizine (ZYRTEC ALLERGY) 10 MG tablet Take 1 tablet (10 mg total) by mouth daily. 01/10/21   Rushie Chestnut, PA-C  clobetasol ointment (TEMOVATE) 0.05 % Apply 1 application topically 2 (two) times daily as needed for rash. 09/18/20   [provider]  DUPIXENT 300 MG/2ML SOPN Inject into the skin. 02/23/22   [provider]  famotidine (PEPCID) 20 MG tablet Take 1 tablet (20 mg total) by mouth 2 (two) times daily. 01/10/21   Rushie Chestnut, PA-C  gabapentin (NEURONTIN) 300 MG capsule Take 300 mg by mouth at bedtime as needed for pain. 09/10/20   [provider]  methocarbamol (ROBAXIN) 500 MG tablet Take 1 tablet (500 mg total) by mouth every 6 (six) hours as needed for muscle spasms. 10/25/20   Barnett Abu, MD  omeprazole (PRILOSEC) 20 MG capsule Take 20 mg by mouth every morning. 02/10/22   [provider]  oxyCODONE-acetaminophen (PERCOCET/ROXICET) 5-325 MG tablet Take  1-2 tablets by mouth every 4 (four) hours as needed for moderate pain or severe pain. 10/25/20   Kristeen Miss, MD  traZODone (DESYREL) 50 MG tablet Take 0.5-1 tablets (25-50 mg total) by mouth at bedtime as needed for sleep. Patient taking differently: Take 50 mg by mouth at bedtime. 08/21/20   Azzie Glatter, FNP  triamcinolone (KENALOG) 0.1 % APPLY 1 APPLICATION TOPICALLY 2 TIMES DAILY. Patient taking differently: Apply 1 application topically 2 (two) times daily as needed (rash). 08/21/20   Azzie Glatter, FNP    Family History Family History  Family history unknown: Yes    Social History Social History   Tobacco Use   Smoking status: Every Day    Packs/day: 0.50    Years: 30.00    Total pack years: 15.00    Types:  Cigarettes   Smokeless tobacco: Never  Vaping Use   Vaping Use: Never used  Substance Use Topics   Alcohol use: Yes    Alcohol/week: 6.0 standard drinks of alcohol    Types: 6 Glasses of wine per week   Drug use: No     Allergies   Nickel, Shellfish allergy, Sulfa antibiotics, Other, Contrast media [iodinated contrast media], Onion, Tomato, Latex, and Neosporin [neomycin-bacitracin zn-polymyx]   Review of Systems Review of Systems   Physical Exam Triage Vital Signs ED Triage Vitals  Enc Vitals Group     BP 03/08/22 1402 108/69     Pulse Rate 03/08/22 1402 62     Resp 03/08/22 1402 20     Temp 03/08/22 1402 98 F (36.7 C)     Temp Source 03/08/22 1402 Oral     SpO2 03/08/22 1402 96 %     Weight --      Height --      Head Circumference --      Peak Flow --      Pain Score 03/08/22 1406 8     Pain Loc --      Pain Edu? --      Excl. in [place]? --    No data found.  Updated Vital Signs BP 108/69 (BP Location: Left Arm)   Pulse 62   Temp 98 F (36.7 C) (Oral)   Resp 20   SpO2 96%   Visual Acuity Right Eye Distance:   Left Eye Distance:   Bilateral Distance:    Right Eye Near:   Left Eye Near:    Bilateral Near:     Physical Exam Vitals reviewed.  Constitutional:      Appearance: Normal appearance.  Musculoskeletal:     Left forearm: Swelling, tenderness and bony tenderness present. No deformity.     Left wrist: Swelling, tenderness and bony tenderness present. No deformity. Decreased range of motion.  Skin:    General: Skin is warm and dry.  Neurological:     General: No focal deficit present.     Mental Status: She is alert and oriented to person, place, and time.  Psychiatric:        Mood and Affect: Mood normal.        Behavior: Behavior normal.      UC Treatments / Results  Labs (all labs ordered are listed, but only abnormal results are displayed) Labs Reviewed - No data to display  EKG   Radiology No results  found.  Procedures Procedures (including critical care time)  Medications Ordered in UC Medications - No data to display  Initial Impression / Assessment and Plan /  UC Course  I have reviewed the triage vital signs and the nursing notes.  Pertinent labs & imaging results that were available during my care of the patient were reviewed by me and considered in my medical decision making (see chart for details).   Pain to the distal end of her forearm.  Wrist range of motion limited by pain.  X-ray ordered and shows ends versus fracture of the radial metaphysis and displaced fracture of the ulnar styloid.  Referring to orthopedic urgent care for splint/cast.   Final Clinical Impressions(s) / UC Diagnoses   Final diagnoses:  Fall, initial encounter  Acute pain of left wrist   Discharge Instructions   None    ED Prescriptions   None    PDMP not reviewed this encounter.   Charma Igo, FNP 03/08/22 1445    Charma Igo, FNP 03/08/22 [phone]

## 2022-03-08 NOTE — Discharge Instructions (Addendum)
Proceed to therapeutic urgent care for treatment of fracture.

## 2022-03-10 HISTORY — PX: COLONOSCOPY: SHX174

## 2022-03-10 HISTORY — PX: COLONOSCOPY, DIAGNOSTIC (SCREENING): SHX174

## 2022-03-11 ENCOUNTER — Encounter (INDEPENDENT_AMBULATORY_CARE_PROVIDER_SITE_OTHER): Payer: Self-pay

## 2022-03-12 ENCOUNTER — Encounter (INDEPENDENT_AMBULATORY_CARE_PROVIDER_SITE_OTHER): Payer: Self-pay

## 2022-03-19 ENCOUNTER — Other Ambulatory Visit: Payer: Self-pay | Admitting: Orthopedic Surgery

## 2022-03-23 ENCOUNTER — Encounter: Payer: Self-pay | Admitting: Orthopedic Surgery

## 2022-03-27 ENCOUNTER — Other Ambulatory Visit: Payer: Self-pay

## 2022-03-27 ENCOUNTER — Ambulatory Visit: Payer: Self-pay

## 2022-03-27 ENCOUNTER — Encounter
Admission: RE | Disposition: A | Discharge status: Home / Self Care | Payer: Self-pay | Source: Ambulatory Visit | Attending: Orthopedic Surgery

## 2022-03-27 ENCOUNTER — Ambulatory Visit: Payer: Medicaid Other | Admitting: Anesthesiology

## 2022-03-27 ENCOUNTER — Ambulatory Visit
Admission: RE | Admit: 2022-03-27 | Discharge: 2022-03-27 | Disposition: A | Discharge status: Home / Self Care | Payer: Medicaid Other | Source: Ambulatory Visit | Attending: Orthopedic Surgery | Admitting: Orthopedic Surgery

## 2022-03-27 ENCOUNTER — Encounter: Payer: Self-pay | Admitting: Orthopedic Surgery

## 2022-03-27 DIAGNOSIS — M1990 Unspecified osteoarthritis, unspecified site: Secondary | ICD-10-CM | POA: Insufficient documentation

## 2022-03-27 DIAGNOSIS — K219 Gastro-esophageal reflux disease without esophagitis: Secondary | ICD-10-CM | POA: Insufficient documentation

## 2022-03-27 DIAGNOSIS — S62122D Displaced fracture of lunate [semilunar], left wrist, subsequent encounter for fracture with routine healing: Secondary | ICD-10-CM | POA: Insufficient documentation

## 2022-03-27 DIAGNOSIS — S52592D Other fractures of lower end of left radius, subsequent encounter for closed fracture with routine healing: Secondary | ICD-10-CM | POA: Diagnosis present

## 2022-03-27 DIAGNOSIS — I10 Essential (primary) hypertension: Secondary | ICD-10-CM | POA: Insufficient documentation

## 2022-03-27 DIAGNOSIS — W19XXXD Unspecified fall, subsequent encounter: Secondary | ICD-10-CM | POA: Diagnosis not present

## 2022-03-27 DIAGNOSIS — F17200 Nicotine dependence, unspecified, uncomplicated: Secondary | ICD-10-CM | POA: Diagnosis not present

## 2022-03-27 HISTORY — DX: Presence of dental prosthetic device (complete) (partial): Z97.2

## 2022-03-27 HISTORY — PX: OPEN REDUCTION INTERNAL FIXATION (ORIF) DISTAL RADIAL FRACTURE: SHX5989

## 2022-03-27 SURGERY — OPEN REDUCTION INTERNAL FIXATION (ORIF) DISTAL RADIUS FRACTURE
Anesthesia: General | Site: Hand | Laterality: Left

## 2022-03-27 MED ORDER — MORPHINE SULFATE (PF) 2 MG/ML IV SOLN: 0.5000 mg | INTRAVENOUS | Status: DC | PRN

## 2022-03-27 MED ORDER — 0.9 % SODIUM CHLORIDE (POUR BTL) OPTIME
TOPICAL | Status: DC | PRN
  Administered 2022-03-27: 60 mL

## 2022-03-27 MED ORDER — DROPERIDOL 2.5 MG/ML IJ SOLN: 0.6250 mg | Freq: Once | INTRAMUSCULAR | Status: DC | PRN

## 2022-03-27 MED ORDER — PROMETHAZINE HCL 25 MG/ML IJ SOLN: 6.2500 mg | INTRAMUSCULAR | Status: DC | PRN

## 2022-03-27 MED ORDER — MIDAZOLAM HCL 5 MG/5ML IJ SOLN
INTRAMUSCULAR | Status: DC | PRN
  Administered 2022-03-27: 2 mg via INTRAVENOUS

## 2022-03-27 MED ORDER — CEFAZOLIN SODIUM-DEXTROSE 2-4 GM/100ML-% IV SOLN
2.0000 g | INTRAVENOUS | Status: AC
  Administered 2022-03-27: 2 g via INTRAVENOUS

## 2022-03-27 MED ORDER — OXYCODONE HCL 5 MG/5ML PO SOLN: 5.0000 mg | Freq: Once | ORAL | Status: AC | PRN

## 2022-03-27 MED ORDER — HYDROCODONE-ACETAMINOPHEN 5-325 MG PO TABS: 1.0000 | ORAL_TABLET | ORAL | Status: DC | PRN

## 2022-03-27 MED ORDER — LACTATED RINGERS IV SOLN: INTRAVENOUS | Status: DC

## 2022-03-27 MED ORDER — LIDOCAINE HCL 1 % IJ SOLN
INTRAMUSCULAR | Status: DC | PRN
  Administered 2022-03-27: 1 mL

## 2022-03-27 MED ORDER — ACETAMINOPHEN 500 MG PO TABS
1000.0000 mg | ORAL_TABLET | Freq: Once | ORAL | Status: AC
  Administered 2022-03-27: 1000 mg via ORAL

## 2022-03-27 MED ORDER — SODIUM CHLORIDE 0.9 % IV SOLN: INTRAVENOUS | Status: DC

## 2022-03-27 MED ORDER — FENTANYL CITRATE (PF) 100 MCG/2ML IJ SOLN
INTRAMUSCULAR | Status: DC | PRN
  Administered 2022-03-27 (×4): 50 ug via INTRAVENOUS

## 2022-03-27 MED ORDER — OXYCODONE HCL 5 MG PO TABS
5.0000 mg | ORAL_TABLET | Freq: Once | ORAL | Status: AC | PRN
  Administered 2022-03-27: 5 mg via ORAL

## 2022-03-27 MED ORDER — DEXMEDETOMIDINE HCL IN NACL 80 MCG/20ML IV SOLN
INTRAVENOUS | Status: DC | PRN
  Administered 2022-03-27 (×2): 4 ug via BUCCAL

## 2022-03-27 MED ORDER — ONDANSETRON HCL 4 MG/2ML IJ SOLN: 4.0000 mg | Freq: Four times a day (QID) | INTRAMUSCULAR | Status: DC | PRN

## 2022-03-27 MED ORDER — HYDROCODONE-ACETAMINOPHEN 5-325 MG PO TABS: 1.0000 | ORAL_TABLET | Freq: Four times a day (QID) | ORAL | 0 refills | Status: DC | PRN

## 2022-03-27 MED ORDER — LIDOCAINE HCL (CARDIAC) PF 100 MG/5ML IV SOSY
PREFILLED_SYRINGE | INTRAVENOUS | Status: DC | PRN
  Administered 2022-03-27: 100 mg via INTRATRACHEAL

## 2022-03-27 MED ORDER — ACETAMINOPHEN 10 MG/ML IV SOLN: 1000.0000 mg | Freq: Once | INTRAVENOUS | Status: DC | PRN

## 2022-03-27 MED ORDER — PHENYLEPHRINE HCL (PRESSORS) 10 MG/ML IV SOLN
INTRAVENOUS | Status: DC | PRN
  Administered 2022-03-27: 100 ug via INTRAVENOUS

## 2022-03-27 MED ORDER — ACETAMINOPHEN 325 MG PO TABS: 325.0000 mg | ORAL_TABLET | Freq: Four times a day (QID) | ORAL | Status: DC | PRN

## 2022-03-27 MED ORDER — BUPIVACAINE HCL 0.5 % IJ SOLN
INTRAMUSCULAR | Status: DC | PRN
  Administered 2022-03-27: 10 mL

## 2022-03-27 MED ORDER — ONDANSETRON HCL 4 MG/2ML IJ SOLN
INTRAMUSCULAR | Status: DC | PRN
  Administered 2022-03-27: 4 mg via INTRAVENOUS

## 2022-03-27 MED ORDER — METOPROLOL TARTRATE 5 MG/5ML IV SOLN
INTRAVENOUS | Status: DC | PRN
  Administered 2022-03-27: 1 mg via INTRAVENOUS

## 2022-03-27 MED ORDER — ROPIVACAINE HCL 5 MG/ML IJ SOLN
INTRAMUSCULAR | Status: DC | PRN
  Administered 2022-03-27: 20 mL via PERINEURAL

## 2022-03-27 MED ORDER — FENTANYL CITRATE PF 50 MCG/ML IJ SOSY: 25.0000 ug | PREFILLED_SYRINGE | INTRAMUSCULAR | Status: DC | PRN

## 2022-03-27 MED ORDER — PROPOFOL 10 MG/ML IV BOLUS
INTRAVENOUS | Status: DC | PRN
  Administered 2022-03-27: 50 mg via INTRAVENOUS

## 2022-03-27 MED ORDER — DEXAMETHASONE SODIUM PHOSPHATE 4 MG/ML IJ SOLN
INTRAMUSCULAR | Status: DC | PRN
  Administered 2022-03-27: 4 mg via INTRAVENOUS

## 2022-03-27 MED ORDER — HYDROCODONE-ACETAMINOPHEN 7.5-325 MG PO TABS: 1.0000 | ORAL_TABLET | ORAL | Status: DC | PRN

## 2022-03-27 MED ORDER — ONDANSETRON HCL 4 MG PO TABS: 4.0000 mg | ORAL_TABLET | Freq: Four times a day (QID) | ORAL | Status: DC | PRN

## 2022-03-27 SURGICAL SUPPLY — 39 items
APL PRP STRL LF DISP 70% ISPRP (MISCELLANEOUS) ×1
BIT DRILL 2 FAST STEP (BIT) IMPLANT
BIT DRILL 2.5X4 QC (BIT) IMPLANT
BNDG ELASTIC 4X5.8 VLCR STR LF (GAUZE/BANDAGES/DRESSINGS) ×1 IMPLANT
CHLORAPREP W/TINT 26 (MISCELLANEOUS) ×1 IMPLANT
CUFF TOURN SGL QUICK 18X4 (TOURNIQUET CUFF) IMPLANT
DRAPE FLUOR MINI C-ARM 54X84 (DRAPES) ×1 IMPLANT
ELECT REM PT RETURN 9FT ADLT (ELECTROSURGICAL) ×1
ELECTRODE REM PT RTRN 9FT ADLT (ELECTROSURGICAL) ×1 IMPLANT
GAUZE SPONGE 4X4 12PLY STRL (GAUZE/BANDAGES/DRESSINGS) ×1 IMPLANT
GAUZE XEROFORM 1X8 LF (GAUZE/BANDAGES/DRESSINGS) ×2 IMPLANT
GLOVE SURG SYN 9.0  PF PI (GLOVE) ×1
GLOVE SURG SYN 9.0 PF PI (GLOVE) ×1 IMPLANT
GOWN STRL REUS W/ TWL LRG LVL3 (GOWN DISPOSABLE) ×1 IMPLANT
GOWN STRL REUS W/TWL LRG LVL3 (GOWN DISPOSABLE) ×1
K-WIRE 1.6 (WIRE) ×1
K-WIRE FX5X1.6XNS BN SS (WIRE) ×1
KIT TURNOVER KIT A (KITS) ×1 IMPLANT
KWIRE FX5X1.6XNS BN SS (WIRE) IMPLANT
MANIFOLD NEPTUNE II (INSTRUMENTS) ×1 IMPLANT
NDL FILTER BLUNT 18X1 1/2 (NEEDLE) ×2 IMPLANT
NEEDLE FILTER BLUNT 18X1 1/2 (NEEDLE) ×1 IMPLANT
NS IRRIG 500ML POUR BTL (IV SOLUTION) ×2 IMPLANT
PACK EXTREMITY ARMC (MISCELLANEOUS) ×2 IMPLANT
PAD CAST 4YDX4 CTTN HI CHSV (CAST SUPPLIES) ×4 IMPLANT
PADDING CAST COTTON 4X4 STRL (CAST SUPPLIES) ×2
PEG SUBCHONDRAL SMOOTH 2.0X14 (Peg) IMPLANT
PEG SUBCHONDRAL SMOOTH 2.0X20 (Peg) IMPLANT
PEG SUBCHONDRAL SMOOTH 2.0X22 (Peg) IMPLANT
PLATE SHORT 21.6X48.9 NRRW LT (Plate) IMPLANT
SCALPEL PROTECTED #15 DISP (BLADE) ×2 IMPLANT
SCREW CORT 3.5X10 LNG (Screw) IMPLANT
SPLINT CAST 1 STEP 3X12 (MISCELLANEOUS) ×1 IMPLANT
SUT ETHILON 4-0 (SUTURE) ×1
SUT ETHILON 4-0 FS2 18XMFL BLK (SUTURE) ×1
SUT VICRYL 3-0 27IN (SUTURE) ×1 IMPLANT
SUTURE ETHLN 4-0 FS2 18XMF BLK (SUTURE) ×1 IMPLANT
SYR 3ML LL SCALE MARK (SYRINGE) ×2 IMPLANT
WATER STERILE IRR 500ML POUR (IV SOLUTION) ×2 IMPLANT

## 2022-03-27 NOTE — Op Note (Addendum)
03/27/2022  1:20 PM  PATIENT:  Lynn Black  61 y.o. female  PRE-OPERATIVE DIAGNOSIS:  Closed displaced fracture of lunate of left wrist with routine healing, subsequent encounter  Other closed fracture of distal end of left radius with routine healing, subsequent encounter   POST-OPERATIVE DIAGNOSIS:  Closed displaced fracture of lunate of left wrist with routine   PROCEDURE:  Procedure(s) with comments: OPEN REDUCTION INTERNAL FIXATION (ORIF) DISTAL RADIUS FRACTURE (Left) - Latex  SURGEON: Laurene Footman, MD  ASSISTANTS: None  ANESTHESIA:   general  EBL:  Total I/O In: 100 [IV Piggyback:100] Out: -   BLOOD ADMINISTERED:none  DRAINS: none   LOCAL MEDICATIONS USED:  BUPIVICAINE   SPECIMEN:  No Specimen  DISPOSITION OF SPECIMEN:  N/A  COUNTS:  YES  TOURNIQUET:   Total Tourniquet Time Documented: Upper Arm (Left) - 23 minutes Total: Upper Arm (Left) - 23 minutes   IMPLANTS: [employer] hand innovations short narrow left DVR plate with multiple smooth pegs and cortical screws  DICTATION: .Dragon Dictation patient was brought to the operating room and after adequate anesthesia was obtained the left arm was prepped and draped in the usual sterile fashion.  Tourniquet was applied to the upper arm and after appropriate patient identification and timeout procedures were completed tourniquet was raised to 250 mmHg.  Fingertrap traction was applied to the index and middle fingers with 7 and half pounds of traction off the end of the table.  Volar approach was made centered over the FCR tendon.  Tendon sheath incised and the tendon retracted radially protect the radial artery and associated veins.  The deep fascia was incised and the pronator was elevated off the distal and proximal fragments with exposure of the fracture site.  A Freer elevator was used to separate the fragments to get adequate alignment with a short narrow DVR plate applied with distal first technique using a K  wire to hold the plate in position position of the plate was checked in both AP and lateral projections.  When acceptable position was obtained the smooth pegs were placed drilling measuring and placing the smooth pegs in the distal fragment following this the shaft portion of the plate was brought down to the bone and 3 10 mm cortical screws were sequentially inserted they gave an essentially anatomic alignment in both AP and lateral projections.  Traction was removed and under fluoroscopic views there is no motion of the fracture.  The wound was then irrigated with a tourniquet let down followed by infiltration of 10 cc half percent bupivacaine.  The wound was closed with 3-0 Vicryl subcutaneously and 4-0 nylon for the skin Xeroform 4 x 4 web roll and volar splint was applied followed by an Ace wrap.  PLAN OF CARE: Discharge to home after PACU  PATIENT DISPOSITION:  PACU - hemodynamically stable.

## 2022-03-27 NOTE — Transfer of Care (Signed)
Immediate Anesthesia Transfer of Care Note  Patient: Lynn Black  Procedure(s) Performed: OPEN REDUCTION INTERNAL FIXATION (ORIF) DISTAL RADIUS FRACTURE (Left: Hand)  Patient Location: PACU  Anesthesia Type: General  Level of Consciousness: awake, alert  and patient cooperative  Airway and Oxygen Therapy: Patient Spontanous Breathing and Patient connected to supplemental oxygen  Post-op Assessment: Post-op Vital signs reviewed, Patient's Cardiovascular Status Stable, Respiratory Function Stable, Patent Airway and No signs of Nausea or vomiting  Post-op Vital Signs: Reviewed and stable  Complications: PT remains uncomfortable, discussing nerve block .

## 2022-03-27 NOTE — Anesthesia Preprocedure Evaluation (Addendum)
Anesthesia Evaluation  Patient identified by MRN, date of birth, ID band Patient awake    Reviewed: Allergy & Precautions, NPO status , Patient's Chart, lab work & pertinent test results  Airway Mallampati: III  TM Distance: >3 FB Neck ROM: Full    Dental  (+) Dental Advisory Given, Edentulous Upper, Edentulous Lower   Pulmonary Current Smoker and Patient abstained from smoking.,    breath sounds clear to auscultation       Cardiovascular negative cardio ROS   Rhythm:Regular Rate:Normal     Neuro/Psych negative neurological ROS     GI/Hepatic Neg liver ROS, GERD  ,  Endo/Other  negative endocrine ROS  Renal/GU negative Renal ROS     Musculoskeletal  (+) Arthritis ,   Abdominal   Peds  Hematology negative hematology ROS (+)   Anesthesia Other Findings   Reproductive/Obstetrics                            Lab Results  Component Value Date   WBC 11.2 (H) 10/21/2020   HGB 13.1 10/21/2020   HCT 38.7 10/21/2020   MCV 99.0 10/21/2020   PLT 261 10/21/2020   Lab Results  Component Value Date   CREATININE 0.90 10/21/2020   BUN 9 10/21/2020   NA 134 (L) 10/21/2020   K 4.3 10/21/2020   CL 102 10/21/2020   CO2 23 10/21/2020    Anesthesia Physical  Anesthesia Plan  ASA: II  Anesthesia Plan: General   Post-op Pain Management:    Induction: Intravenous  PONV Risk Score and Plan: 2 and Treatment may vary due to age or medical condition, TIVA and Propofol infusion  Airway Management Planned: Natural Airway  Additional Equipment:   Intra-op Plan:   Post-operative Plan:   Informed Consent: I have reviewed the patients History and Physical, chart, labs and discussed the procedure including the risks, benefits and alternatives for the proposed anesthesia with the patient or authorized representative who has indicated his/her understanding and acceptance.     Dental advisory  given  Plan Discussed with: CRNA  Anesthesia Plan Comments:        Anesthesia Quick Evaluation

## 2022-03-27 NOTE — Discharge Instructions (Addendum)
Keep arm elevated is much as possible Ice to the back of the wrist may help for pain today and tomorrow Work on finger motion is much as you can Call office if you are having problems [phone] Keep splint clean and dry Pain medicine as directed 

## 2022-03-27 NOTE — H&P (Signed)
Chief Complaint  Patient presents with  Left Wrist - Follow-up    History of the Present Illness: Lynn Black is a 61 y.o. female here today for follow-up evaluation of a left distal radius fracture.  She suffered a fall on 03/08/2022. She had been seen without being placed in a brace. 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 She was placed in a wrist brace at her visit on 03/12/2022. She comes back today for follow-up to see if it is displaced and might need ORIF. X-ray was ordered and interpreted.  The patient states she has been able to work. She states she has numbness and tingling in her fingers due to a cold sensation.   I have reviewed past medical, surgical, social and family history, and allergies as documented in the EMR.  Past Medical History: Past Medical History:  Diagnosis Date  Cervical spondylosis with myelopathy  Generalized pruritus  GERD (gastroesophageal reflux disease)  Hypertension  Seasonal allergies  Tobacco dependence   Past Surgical History: Past Surgical History:  Procedure Laterality Date  POSTERIOR LAMINECTOMY / DECOMPRESSION CERVICAL SPINE  C3-4, C7 T1   Past Family History: History reviewed. No pertinent family history.  Medications: Current Outpatient Medications Ordered in Epic  Medication Sig Dispense Refill  DUPIXENT PEN 300 mg/2 mL pen injector Inject 300 mg subcutaneously every 28 (twenty-eight) days  HYDROcodone-acetaminophen (NORCO) 5-325 mg tablet Take 1 tablet by mouth every 4 (four) hours as needed for Pain 25 tablet 0   No current Epic-ordered facility-administered medications on file.   Allergies: Allergies  Allergen Reactions  Nickel Rash  Other Hives  [place] dressing  Iodinated Contrast Media Swelling  Facial swelling  Neosporin [Hydrocortisone] Rash  Onion Hives  Tomato Hives  Latex Hives and Rash    Body mass index is 21.98 kg/m.  Review of Systems: A comprehensive 14 point  ROS was performed, reviewed, and the pertinent orthopaedic findings are documented in the HPI.  There were no vitals filed for this visit.   General Physical Examination:   General/Constitutional: No apparent distress: well-nourished and well developed. Eyes: Pupils equal, round with synchronous movement. Lungs: Clear to auscultation HEENT: Normal Vascular: No edema, swelling or tenderness, except as noted in detailed exam. Cardiac: Heart rate and rhythm is regular. Integumentary: No impressive skin lesions present, except as noted in detailed exam. Neuro/Psych: Normal mood and affect, oriented to person, place, and time.  On exam, numbness to the left fingers.  Radiographs:  AP, lateral, and oblique x-rays of the left wrist were ordered and personally reviewed today. This shows shortening of the distal radius. There is an ulnar plus variance and comminution of the metaphysis. There is a nondisplaced ulnar styloid fracture.  X-ray Impression Shortening of the radius with ulnar positive variance. There is loss of volar tilt with slight dorsal tilt and dorsal displacement.  Assessment: ICD-10-CM  1. Closed nondisplaced fracture of lunate of left wrist with routine healing, subsequent encounter S62.125D  2. Other closed fracture of distal end of left radius with routine healing, subsequent encounter S52.592D   Plan:  The patient has clinical findings of left distal radius fracture.  We discussed the patient's x-ray findings. I discussed the nature of surgery with the patient, and we will try to get this scheduled for her either tomorrow or next Friday. She cannot take time off from work, so we want to do it on a Friday afternoon and have her go back to  work on Monday.  Surgical Risks:  The nature of the condition and the proposed procedure has been reviewed in detail with the patient. Surgical versus non-surgical options and prognosis for recovery have been reviewed and the inherent  risks and benefits of each have been discussed including the risks of infection, bleeding, injury to nerves/blood vessels/tendons, incomplete relief of symptoms, persisting pain and/or stiffness, loss of function, complex regional pain syndrome, failure of the procedure, as appropriate.  Document Attestation: I, Moreen Fowler, have reviewed and updated documentation for [facility], MD, utilizing [employer] DAX.    Electronically signed by Marlena Clipper, MD at 03/20/2022 10:50 AM EDT Reviewed  H+P. No changes noted.

## 2022-03-27 NOTE — Anesthesia Postprocedure Evaluation (Signed)
Anesthesia Post Note  Patient: Lynn Black  Procedure(s) Performed: OPEN REDUCTION INTERNAL FIXATION (ORIF) DISTAL RADIUS FRACTURE (Left: Hand)  Patient location during evaluation: PACU Anesthesia Type: General Level of consciousness: awake and alert Pain management: pain level controlled Vital Signs Assessment: post-procedure vital signs reviewed and stable Respiratory status: spontaneous breathing, nonlabored ventilation and respiratory function stable Cardiovascular status: blood pressure returned to baseline and stable Postop Assessment: no apparent nausea or vomiting Anesthetic complications: no   There were no known notable events for this encounter.   Last Vitals:  Vitals:   03/27/22 1405 03/27/22 1410  BP:    Pulse: 73 79  Resp: 14 12  Temp:    SpO2: 100% 99%    Last Pain:  Vitals:   03/27/22 1410  TempSrc:   PainSc: 2                  Iran Ouch

## 2022-03-27 NOTE — Anesthesia Procedure Notes (Signed)
Anesthesia Regional Block: Supraclavicular block   Pre-Anesthetic Checklist: , timeout performed,  Correct Patient, Correct Site, Correct Laterality,  Correct Procedure, Correct Position, site marked,  Risks and benefits discussed,  Surgical consent,  Pre-op evaluation,  At surgeon's request and post-op pain management  Laterality: Upper and Left  Prep: chloraprep       Needles:  Injection technique: Single-shot  Needle Type: Stimiplex     Needle Length: 9cm  Needle Gauge: 22     Additional Needles:   Procedures:,,,, ultrasound used (permanent image in chart),,    Narrative:  Start time: 03/27/2022 1:50 PM End time: 03/27/2022 1:55 PM Injection made incrementally with aspirations every 5 mL.  Performed by: Personally  Anesthesiologist: Iran Ouch, MD  Additional Notes: Patient consented for risk and benefits of nerve block including but not limited to nerve damage, failed block, bleeding and infection.  Patient voiced understanding.  Functioning IV was confirmed and monitors were applied.  Timeout done prior to procedure and prior to any sedation being given to the patient.  Patient confirmed procedure site prior to any sedation given to the patient. Sterile prep,hand hygiene and sterile gloves were used.  Minimal sedation used for procedure.  No paresthesia endorsed by patient during the procedure.  Negative aspiration and negative test dose prior to incremental administration of local anesthetic. The patient tolerated the procedure well with no immediate complications.

## 2022-03-30 ENCOUNTER — Encounter (INDEPENDENT_AMBULATORY_CARE_PROVIDER_SITE_OTHER): Payer: Self-pay | Admitting: Family Medicine

## 2022-03-30 ENCOUNTER — Ambulatory Visit (INDEPENDENT_AMBULATORY_CARE_PROVIDER_SITE_OTHER): Payer: BLUE CROSS/BLUE SHIELD | Admitting: Family Medicine

## 2022-03-30 VITALS — BP 124/78 | HR 91 | Temp 98.6°F | Wt 182.0 lb

## 2022-03-30 DIAGNOSIS — Z6829 Body mass index (BMI) 29.0-29.9, adult: Secondary | ICD-10-CM

## 2022-03-30 DIAGNOSIS — E7800 Pure hypercholesterolemia, unspecified: Secondary | ICD-10-CM

## 2022-03-30 DIAGNOSIS — Z5181 Encounter for therapeutic drug level monitoring: Secondary | ICD-10-CM

## 2022-03-30 NOTE — Progress Notes (Signed)
Subjective:      Patient ID: Melinda Brown is a 61 y.o. female     Chief Complaint   Patient presents with    Obesity     Pt states she is doing well on meds        HPI     61 yo female doing well on Ozempic 0.5 mg weekly, having lost over 8 pounds since her physical in May, and not having any adverse events with the medication...GYN: [name]. Prescription medication: Zolpidem CR 12.5 mg as needed, Phentermine 37.5 mg as needed, Ozempic 0.5 mg weekly. no known drug allergy. No tobacco/daily alcohol. Past medical history: Insomnia/restless leg syndrome. Past surgical history: OU cataract, Appendectomy, (benign) breast lumpectomy, (multiple) cosmetic . Cardiac risk factor: Female greater than 55, hyperlipidemia (LDL). family history: No colon cancer. Tdap 09/24/2016, Shingrix (02/16/2019, 04/29/2019), Pfizer (09/04/2019, 09/27/19, 04/09/20, bivalent 04/29/21), influenza 04/29/21. Colonoscopy reportedly negative... Mammography 2021 benign     Review of Systems   Constitutional: Negative.    HENT: Negative.     Eyes: Negative.    Respiratory: Negative.     Cardiovascular: Negative.    Gastrointestinal: Negative.    Endocrine: Negative.    Genitourinary: Negative.    Musculoskeletal: Negative.    Skin: Negative.    Allergic/Immunologic: Negative.    Neurological: Negative.    Hematological: Negative.    Psychiatric/Behavioral: Negative.     All other systems reviewed and are negative.         BP 124/78 (BP Site: Left arm, Patient Position: Sitting, Cuff Size: Medium)   Pulse 91   Temp 98.6 F (37 C) (Tympanic)   Wt 82.6 kg (182 lb)   LMP  (LMP Unknown)   SpO2 95%   BMI 29.38 kg/m     Objective:     Physical Exam  Vitals and nursing note reviewed.   Constitutional:       Appearance: Normal appearance.   HENT:      Head: Normocephalic and atraumatic.      Right Ear: External ear normal.      Left Ear: External ear normal.   Eyes:      Extraocular Movements: Extraocular movements intact.      Conjunctiva/sclera:  Conjunctivae normal.      Pupils: Pupils are equal, round, and reactive to light.   Neck:      Vascular: No carotid bruit.   Cardiovascular:      Rate and Rhythm: Normal rate and regular rhythm.      Pulses: Normal pulses.      Heart sounds: Normal heart sounds.   Pulmonary:      Effort: Pulmonary effort is normal.      Breath sounds: Normal breath sounds.   Abdominal:      General: Abdomen is flat.      Palpations: Abdomen is soft.   Musculoskeletal:         General: Normal range of motion.      Cervical back: Normal range of motion and neck supple.      Right lower leg: No edema.      Left lower leg: No edema.   Skin:     General: Skin is warm and dry.   Neurological:      General: No focal deficit present.      Mental Status: She is alert and oriented to person, place, and time. Mental status is at baseline.   Psychiatric:         Mood  and Affect: Mood normal.         Behavior: Behavior normal.         Thought Content: Thought content normal.         Judgment: Judgment normal.        Assessment:     1. Pure hypercholesterolemia  - Lipid panel; Future  - CT Cardiac Scoring; Future  - CT Cardiac Scoring    2. Medication monitoring encounter  - Basic Metabolic Panel; Future  - Hemoglobin A1C; Future    3. BMI 29.0-29.9,adult        Plan:     See assessment/orders. Discussed prescription statin and 81 mg daily aspirin if CT cardiac score positive    Theresa Mulligan, MD

## 2022-03-31 ENCOUNTER — Encounter: Payer: Self-pay | Admitting: Orthopedic Surgery

## 2022-04-03 ENCOUNTER — Other Ambulatory Visit (FREE_STANDING_LABORATORY_FACILITY): Payer: BLUE CROSS/BLUE SHIELD

## 2022-04-03 DIAGNOSIS — Z5181 Encounter for therapeutic drug level monitoring: Secondary | ICD-10-CM

## 2022-04-03 DIAGNOSIS — E7800 Pure hypercholesterolemia, unspecified: Secondary | ICD-10-CM

## 2022-04-03 LAB — LIPID PANEL
Cholesterol / HDL Ratio: 3.6 Index
Cholesterol: 228 mg/dL — ABNORMAL HIGH (ref 0–199)
HDL: 64 mg/dL (ref 40–9999)
LDL Calculated: 139 mg/dL — ABNORMAL HIGH (ref 0–99)
Triglycerides: 126 mg/dL (ref 34–149)
VLDL Calculated: 25 mg/dL (ref 10–40)

## 2022-04-03 LAB — BASIC METABOLIC PANEL
Anion Gap: 4 — ABNORMAL LOW (ref 5.0–15.0)
BUN: 14 mg/dL (ref 7.0–21.0)
CO2: 27 mEq/L (ref 17–29)
Calcium: 9.3 mg/dL (ref 8.5–10.5)
Chloride: 104 mEq/L (ref 99–111)
Creatinine: 0.8 mg/dL (ref 0.4–1.0)
Glucose: 89 mg/dL (ref 70–100)
Potassium: 4.6 mEq/L (ref 3.5–5.3)
Sodium: 135 mEq/L (ref 135–145)
eGFR: >60 mL/min/{1.73_m2} (ref >=60)

## 2022-04-03 LAB — HEMOLYSIS INDEX: Hemolysis Index: 6 Index (ref 0–24)

## 2022-04-03 LAB — HEMOGLOBIN A1C
Average Estimated Glucose: 93.9 mg/dL
Hemoglobin A1C: 4.9 % (ref 4.6–5.6)

## 2022-04-13 ENCOUNTER — Telehealth (INDEPENDENT_AMBULATORY_CARE_PROVIDER_SITE_OTHER): Payer: Self-pay | Admitting: Family Medicine

## 2022-04-13 NOTE — Telephone Encounter (Addendum)
Pt is requesting Cpt code for CT Cardiac Scoring . Pt states she wants to make sure this is covered  by the insurance

## 2022-04-13 NOTE — Telephone Encounter (Signed)
Please advise 

## 2022-04-13 NOTE — Telephone Encounter (Signed)
Pt called back, the CPT code 59741 was given. Please advise pt if code is incorrect.

## 2022-04-14 NOTE — Telephone Encounter (Signed)
LMTCB # 1

## 2022-04-17 ENCOUNTER — Other Ambulatory Visit: Payer: Self-pay

## 2022-04-17 ENCOUNTER — Emergency Department: Payer: Medicaid Other

## 2022-04-17 ENCOUNTER — Emergency Department
Admission: EM | Admit: 2022-04-17 | Discharge: 2022-04-17 | Disposition: A | Discharge status: Home / Self Care | Payer: Medicaid Other | Attending: Student in an Organized Health Care Education/Training Program | Admitting: Student in an Organized Health Care Education/Training Program

## 2022-04-17 ENCOUNTER — Encounter: Payer: Self-pay | Admitting: *Deleted

## 2022-04-17 DIAGNOSIS — M25511 Pain in right shoulder: Secondary | ICD-10-CM | POA: Insufficient documentation

## 2022-04-17 LAB — BASIC METABOLIC PANEL
Anion gap: 10 (ref 5–15)
BUN: 14 mg/dL (ref 6–20)
CO2: 21 mmol/L — ABNORMAL LOW (ref 22–32)
Calcium: 9.1 mg/dL (ref 8.9–10.3)
Chloride: 98 mmol/L (ref 98–111)
Creatinine, Ser: 1.07 mg/dL — ABNORMAL HIGH (ref 0.44–1.00)
GFR, Estimated: 59 mL/min — ABNORMAL LOW (ref >60)
Glucose, Bld: 111 mg/dL — ABNORMAL HIGH (ref 70–99)
Potassium: 3.9 mmol/L (ref 3.5–5.1)
Sodium: 129 mmol/L — ABNORMAL LOW (ref 135–145)

## 2022-04-17 LAB — CBC WITH DIFFERENTIAL/PLATELET
Abs Immature Granulocytes: 0.06 10*3/uL (ref 0.00–0.07)
Basophils Absolute: 0.1 10*3/uL (ref 0.0–0.1)
Basophils Relative: 0 %
Eosinophils Absolute: 0.1 10*3/uL (ref 0.0–0.5)
Eosinophils Relative: 1 %
HCT: 28.8 % — ABNORMAL LOW (ref 36.0–46.0)
Hemoglobin: 10 g/dL — ABNORMAL LOW (ref 12.0–15.0)
Immature Granulocytes: 1 %
Lymphocytes Relative: 22 %
Lymphs Abs: 2.4 10*3/uL (ref 0.7–4.0)
MCH: 32.9 pg (ref 26.0–34.0)
MCHC: 34.7 g/dL (ref 30.0–36.0)
MCV: 94.7 fL (ref 80.0–100.0)
Monocytes Absolute: 1.6 10*3/uL — ABNORMAL HIGH (ref 0.1–1.0)
Monocytes Relative: 14 %
Neutro Abs: 7 10*3/uL (ref 1.7–7.7)
Neutrophils Relative %: 62 %
Platelets: 298 10*3/uL (ref 150–400)
RBC: 3.04 MIL/uL — ABNORMAL LOW (ref 3.87–5.11)
RDW: 12.1 % (ref 11.5–15.5)
WBC: 11.2 10*3/uL — ABNORMAL HIGH (ref 4.0–10.5)
nRBC: 0 % (ref 0.0–0.2)

## 2022-04-17 LAB — D-DIMER, QUANTITATIVE: D-Dimer, Quant: 0.94 ug/mL-FEU — ABNORMAL HIGH (ref 0.00–0.50)

## 2022-04-17 MED ORDER — FENTANYL CITRATE PF 50 MCG/ML IJ SOSY
50.0000 ug | PREFILLED_SYRINGE | Freq: Once | INTRAMUSCULAR | Status: AC
  Administered 2022-04-17: 50 ug via INTRAVENOUS
  Filled 2022-04-17: qty 1

## 2022-04-17 MED ORDER — DIPHENHYDRAMINE HCL 50 MG/ML IJ SOLN
50.0000 mg | Freq: Once | INTRAMUSCULAR | Status: AC
  Administered 2022-04-17: 50 mg via INTRAVENOUS
  Filled 2022-04-17: qty 1

## 2022-04-17 MED ORDER — ONDANSETRON HCL 4 MG/2ML IJ SOLN
4.0000 mg | Freq: Once | INTRAMUSCULAR | Status: AC
  Administered 2022-04-17: 4 mg via INTRAVENOUS
  Filled 2022-04-17: qty 2

## 2022-04-17 MED ORDER — IOHEXOL 350 MG/ML SOLN
75.0000 mL | Freq: Once | INTRAVENOUS | Status: AC | PRN
  Administered 2022-04-17: 75 mL via INTRAVENOUS

## 2022-04-17 MED ORDER — PREDNISONE 10 MG (21) PO TBPK: ORAL_TABLET | ORAL | 0 refills | Status: DC

## 2022-04-17 MED ORDER — HYDROCODONE-ACETAMINOPHEN 5-325 MG PO TABS: 1.0000 | ORAL_TABLET | Freq: Four times a day (QID) | ORAL | 0 refills | Status: DC | PRN

## 2022-04-17 MED ORDER — SODIUM CHLORIDE 0.9 % IV SOLN: Freq: Once | INTRAVENOUS | Status: AC

## 2022-04-17 MED ORDER — METHYLPREDNISOLONE SODIUM SUCC 40 MG IJ SOLR
40.0000 mg | Freq: Once | INTRAMUSCULAR | Status: AC
  Administered 2022-04-17: 40 mg via INTRAVENOUS
  Filled 2022-04-17: qty 1

## 2022-04-17 MED ORDER — DIPHENHYDRAMINE HCL 25 MG PO CAPS: 50.0000 mg | ORAL_CAPSULE | Freq: Once | ORAL | Status: AC

## 2022-04-17 NOTE — ED Notes (Signed)
States she did some relief with pain meds   but states she is uncomfortable  Provider aware

## 2022-04-17 NOTE — Discharge Instructions (Signed)
Please follow-up with your primary care provider if you are not improving over the next few days.  You may take the pain medication that was prescribed to you after your wrist surgery which may also help with your pain.  Return to the emergency department for symptoms that change or worsen if you are unable to see primary care.

## 2022-04-17 NOTE — ED Notes (Signed)
Patient transported to CT 

## 2022-04-17 NOTE — ED Notes (Signed)
Pt was informed by CT that the imaging could take up to 45 minutes to be read. Pt states "hell no" and requests to speak with a provider. Pt states that she wants to go home. Provider aware.

## 2022-04-17 NOTE — ED Notes (Signed)
Patient asked and was given a cola. Patient states she needs to sit up, that the stretcher  was "killing me." This writer attempted to help, but patient refused assistance. Patient states she "might just take all this stuff off and leave." Cari B. Triplett NP aware.

## 2022-04-17 NOTE — ED Notes (Signed)
See triage note  Presents with pain under right shoulder  Denies any injury  States pain increases with inspiration  No fever  States also has had recent surgery

## 2022-04-17 NOTE — ED Triage Notes (Signed)
Patient c/o pain in right shoulder for 5 days. Patient states she has had pain there before.

## 2022-04-17 NOTE — ED Provider Notes (Signed)
[facility] Provider Note    Event Date/Time   First MD Initiated Contact with Patient 04/17/22 1132     (approximate)   History   Shoulder Pain   HPI  ARLINGTON RILE is a 61 y.o. female with ORIF left wrist less than a month ago and as listed in the EMR presents to the emergency department for treatment and evaluation of pain behind the right shoulder blade for the past 5 days. Pain increases with movement and deep breath. She does smoke cigarettes. No hormone supplements. No history of DVT/PE. No known injury.      Physical Exam   Triage Vital Signs: ED Triage Vitals  Enc Vitals Group     BP 04/17/22 1112 (!) 144/81     Pulse Rate 04/17/22 1112 96     Resp 04/17/22 1112 18     Temp 04/17/22 1112 98.5 F (36.9 C)     Temp Source 04/17/22 1112 Oral     SpO2 04/17/22 1112 100 %     Weight 04/17/22 1113 119 lb (54 kg)     Height 04/17/22 1113 5\' 2"  (1.575 m)     Head Circumference --      Peak Flow --      Pain Score 04/17/22 1113 10     Pain Loc --      Pain Edu? --      Excl. in [place]? --     Most recent vital signs: Vitals:   04/17/22 1112 04/17/22 1438  BP: (!) 144/81 (!) 140/78  Pulse: 96 90  Resp: 18 18  Temp: 98.5 F (36.9 C)   SpO2: 100% 100%     General: Awake, no distress. Appears uncomfortable. CV:  Good peripheral perfusion.  Resp:  Normal effort. Breath sounds clear to auscultation Abd:  No distention.  Other:     ED Results / Procedures / Treatments   Labs (all labs ordered are listed, but only abnormal results are displayed) Labs Reviewed  BASIC METABOLIC PANEL - Abnormal; Notable for the following components:      Result Value   Sodium 129 (*)    CO2 21 (*)    Glucose, Bld 111 (*)    Creatinine, Ser 1.07 (*)    GFR, Estimated 59 (*)    All other components within normal limits  CBC WITH DIFFERENTIAL/PLATELET - Abnormal; Notable for the following components:   WBC 11.2 (*)    RBC 3.04 (*)    Hemoglobin 10.0  (*)    HCT 28.8 (*)    Monocytes Absolute 1.6 (*)    All other components within normal limits  D-DIMER, QUANTITATIVE - Abnormal; Notable for the following components:   D-Dimer, Quant 0.94 (*)    All other components within normal limits     EKG  NSR rate of 89   RADIOLOGY  CTA PE study negative for acute concerns.  PROCEDURES:  Critical Care performed: No  Procedures   MEDICATIONS ORDERED IN ED: Medications  fentaNYL (SUBLIMAZE) injection 50 mcg (50 mcg Intravenous Given 04/17/22 1209)  ondansetron (ZOFRAN) injection 4 mg (4 mg Intravenous Given 04/17/22 1209)  methylPREDNISolone sodium succinate (SOLU-MEDROL) 40 mg/mL injection 40 mg (40 mg Intravenous Given 04/17/22 1308)  diphenhydrAMINE (BENADRYL) capsule 50 mg ( Oral See Alternative 04/17/22 1602)    Or  diphenhydrAMINE (BENADRYL) injection 50 mg (50 mg Intravenous Given 04/17/22 1602)  0.9 %  sodium chloride infusion (0 mLs Intravenous Stopping previously hung infusion 04/17/22  1640)  iohexol (OMNIPAQUE) 350 MG/ML injection 75 mL (75 mLs Intravenous Contrast Given 04/17/22 1643)     IMPRESSION / MDM / ASSESSMENT AND PLAN / ED COURSE  I reviewed the triage vital signs and the nursing notes.                              Differential diagnosis includes, but is not limited to pleurisy, PE, musculoskeletal strain  Patient's presentation is most consistent with acute presentation with potential threat to life or bodily function.  61 year old female presenting to the ER for evaluation of subscapular pain. See HPI. With her recent surgical procedure and she smokes cigarettes, PE is of concern. CTA ordered initially, but she has a contrast allergy listed. Patient does not recall the allergy and states if she had a reaction to it, it would have been 40 years ago. D dimer added to labs.  D dimer is elevated. Plan will be to follow the contrast allergy protocol. Patient made aware of the concern for PE and reason we need  to proceed with the CTA. She is agreeable to the plan. Clinical Course as of 04/17/22 1713  Fri Apr 17, 2022  1423 Patient aggravated about the wait and states that she is uncomfortable on the stretcher. Pain medication offered, but she declined. She "just wants to go home."  I again explained the importance of the scan and the risk of her leaving before it is completed. She has agreed to stay for now. [CT]  1707 CTA is negative for PE.  Patient to be discharged home.  She has a history of pleurisy and states that this feels pretty much the same.  She will be treated with tapering prednisone and pain medication. She is to follow up with her primary care provider this week. She was advised to return to the ER for symptoms that change or worsen. [CT]    Clinical Course User Index [CT] Seven Marengo B, FNP     FINAL CLINICAL IMPRESSION(S) / ED DIAGNOSES   Final diagnoses:  Subscapular pain, right     Rx / DC Orders   ED Discharge Orders          Ordered    predniSONE (STERAPRED UNI-PAK 21 TAB) 10 MG (21) TBPK tablet        04/17/22 1705    HYDROcodone-acetaminophen (NORCO/VICODIN) 5-325 MG tablet  Every 6 hours PRN        04/17/22 1711             Note:  This document was prepared using Dragon voice recognition software and may include unintentional dictation errors.   Chinita Pester, FNP 04/17/22 1713    Willy Eddy, MD 04/17/22 1747

## 2022-04-20 ENCOUNTER — Other Ambulatory Visit (INDEPENDENT_AMBULATORY_CARE_PROVIDER_SITE_OTHER): Payer: Self-pay | Admitting: Family Medicine

## 2022-04-20 MED ORDER — SEMAGLUTIDE (1 MG/DOSE) 4 MG/3ML SC SOPN
1.0000 mg | PEN_INJECTOR | SUBCUTANEOUS | 0 refills | Status: DC
Start: 2022-04-20 — End: 2022-06-23

## 2022-04-28 ENCOUNTER — Other Ambulatory Visit (INDEPENDENT_AMBULATORY_CARE_PROVIDER_SITE_OTHER): Payer: Self-pay | Admitting: Family Medicine

## 2022-04-28 ENCOUNTER — Other Ambulatory Visit: Payer: Self-pay | Admitting: Family Medicine

## 2022-04-28 MED ORDER — SEMAGLUTIDE (1 MG/DOSE) 4 MG/3ML SC SOPN
1.0000 mg | PEN_INJECTOR | SUBCUTANEOUS | 1 refills | Status: DC
Start: 2022-04-28 — End: 2022-08-26

## 2022-05-04 ENCOUNTER — Other Ambulatory Visit (INDEPENDENT_AMBULATORY_CARE_PROVIDER_SITE_OTHER): Payer: Self-pay | Admitting: Family Medicine

## 2022-05-04 DIAGNOSIS — I2510 Atherosclerotic heart disease of native coronary artery without angina pectoris: Secondary | ICD-10-CM

## 2022-05-04 DIAGNOSIS — E7800 Pure hypercholesterolemia, unspecified: Secondary | ICD-10-CM

## 2022-05-04 MED ORDER — ROSUVASTATIN CALCIUM 5 MG PO TABS
5.0000 mg | ORAL_TABLET | Freq: Every day | ORAL | 1 refills | Status: DC
Start: 2022-05-04 — End: 2022-10-29

## 2022-05-11 ENCOUNTER — Encounter (INDEPENDENT_AMBULATORY_CARE_PROVIDER_SITE_OTHER): Payer: Self-pay | Admitting: Family Medicine

## 2022-06-12 ENCOUNTER — Telehealth (INDEPENDENT_AMBULATORY_CARE_PROVIDER_SITE_OTHER): Payer: Self-pay

## 2022-06-12 NOTE — Telephone Encounter (Signed)
Ozempic (1 MG/DOSE) 4MG /3ML pen-injectors  Key: [id number]  Rx #: [id number]  Prior authorization submitted using Covermymeds  Pending     Prior Authorization Questions:  1.What is the patient's diagnosis? Other, please specify. BMI 30.0 - 30.9 adult    2.Does the patient have a diagnosis of type 2 diabetes mellitus? NO

## 2022-06-15 NOTE — Telephone Encounter (Addendum)
Ozempic (1 MG/DOSE) 4MG /3ML pen-injectors  Key: [id number]  Rx #: [id number]  Prior authorization submitted using Covermymeds  DENIED - not FDA approved for weight loss.     Patient has been on this medication since 10/2021

## 2022-06-17 ENCOUNTER — Encounter (INDEPENDENT_AMBULATORY_CARE_PROVIDER_SITE_OTHER): Payer: Self-pay | Admitting: Family Medicine

## 2022-06-18 ENCOUNTER — Telehealth (INDEPENDENT_AMBULATORY_CARE_PROVIDER_SITE_OTHER): Payer: Self-pay | Admitting: Family Medicine

## 2022-06-18 NOTE — Telephone Encounter (Signed)
[facility] [address]  is requesting a CB with the ICD code for ozempic. Please advise and f/u

## 2022-06-18 NOTE — Telephone Encounter (Signed)
Called pharmacy and gave them the previous diagnosis BMI 30.0-30.9,adult

## 2022-06-23 ENCOUNTER — Other Ambulatory Visit (INDEPENDENT_AMBULATORY_CARE_PROVIDER_SITE_OTHER): Payer: Self-pay | Admitting: Family Medicine

## 2022-07-09 ENCOUNTER — Other Ambulatory Visit: Payer: Self-pay | Admitting: Obstetrics & Gynecology

## 2022-07-16 ENCOUNTER — Other Ambulatory Visit: Payer: Self-pay | Admitting: Otolaryngology

## 2022-07-16 DIAGNOSIS — R1310 Dysphagia, unspecified: Secondary | ICD-10-CM

## 2022-07-16 DIAGNOSIS — F458 Other somatoform disorders: Secondary | ICD-10-CM

## 2022-07-29 ENCOUNTER — Ambulatory Visit (INDEPENDENT_AMBULATORY_CARE_PROVIDER_SITE_OTHER): Payer: BLUE CROSS/BLUE SHIELD | Admitting: Family Medicine

## 2022-07-29 ENCOUNTER — Encounter (INDEPENDENT_AMBULATORY_CARE_PROVIDER_SITE_OTHER): Payer: Self-pay | Admitting: Family Medicine

## 2022-07-29 VITALS — BP 124/80 | HR 78 | Temp 97.9°F | Ht 66.0 in | Wt 191.0 lb

## 2022-07-29 DIAGNOSIS — G2581 Restless legs syndrome: Secondary | ICD-10-CM | POA: Insufficient documentation

## 2022-07-29 DIAGNOSIS — E7800 Pure hypercholesterolemia, unspecified: Secondary | ICD-10-CM

## 2022-07-29 DIAGNOSIS — I2510 Atherosclerotic heart disease of native coronary artery without angina pectoris: Secondary | ICD-10-CM

## 2022-07-29 DIAGNOSIS — Z6830 Body mass index (BMI) 30.0-30.9, adult: Secondary | ICD-10-CM

## 2022-07-29 DIAGNOSIS — B9689 Other specified bacterial agents as the cause of diseases classified elsewhere: Secondary | ICD-10-CM | POA: Insufficient documentation

## 2022-07-29 NOTE — Progress Notes (Signed)
Subjective:      Patient ID: Melinda Brown is a 62 y.o. female     Chief Complaint   Patient presents with    Consult (Initial)     Cholesterol and medication check        HPI     62 yo female...GYN: Samuel Jester. Prescription medication: Zolpidem CR 12.5 mg as needed, Phentermine 37.5 mg as needed, Crestor 5 mg qd. no known drug allergy. No tobacco/daily alcohol. 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 Past surgical history: OU cataract, Appendectomy, (benign) breast lumpectomy, (multiple) cosmetic . Cardiac risk factor: Female greater than 55, hyperlipidemia (LDL) ((+) CAC 11/23) Kirke Shaggy). family history: atrial fibrillation/non-prem CAD (father), atrial fibrillation/ILD (mother), No colon cancer. Tdap 09/24/2016, Shingrix (02/16/2019, 04/29/2019), Pfizer (09/04/2019, 09/27/19, 04/09/20, bivalent 04/29/21), influenza 04/29/21. Colonoscopy reportedly negative... Mammography 2021 benign      Review of Systems   Constitutional:  Positive for fatigue.   HENT: Negative.     Eyes: Negative.    Respiratory:  Positive for cough.    Cardiovascular: Negative.    Gastrointestinal: Negative.    Endocrine: Negative.    Genitourinary: Negative.    Musculoskeletal: Negative.    Skin: Negative.    Allergic/Immunologic: Negative.    Neurological: Negative.    Hematological: Negative.    Psychiatric/Behavioral:  The patient is nervous/anxious.    All other systems reviewed and are negative.         BP 124/80 (BP Site: Left arm, Patient Position: Sitting)   Pulse 78   Temp 97.9 F (36.6 C) (Tympanic)   Ht 1.676 m (5' 6"$ )   Wt 86.6 kg (191 lb)   LMP  (LMP Unknown)   SpO2 100%   BMI 30.83 kg/m     Objective:     Physical Exam  Vitals and nursing note reviewed.   Constitutional:       Appearance: Normal appearance.   HENT:      Head: Normocephalic and atraumatic.      Right Ear: External ear normal.      Left Ear: External ear normal.       Mouth/Throat:      Pharynx: Oropharynx is clear.   Eyes:      Extraocular Movements: Extraocular movements intact.      Conjunctiva/sclera: Conjunctivae normal.      Pupils: Pupils are equal, round, and reactive to light.   Neck:      Vascular: No carotid bruit.   Cardiovascular:      Rate and Rhythm: Normal rate and regular rhythm.      Pulses: Normal pulses.      Heart sounds: Normal heart sounds.   Pulmonary:      Effort: Pulmonary effort is normal.      Breath sounds: Normal breath sounds. No wheezing, rhonchi or rales.   Abdominal:      General: Abdomen is flat.      Palpations: Abdomen is soft.   Musculoskeletal:         General: Normal range of motion.      Cervical back: Normal range of motion and neck supple.      Right lower leg: No edema.      Left lower leg: No edema.   Lymphadenopathy:      Cervical: No cervical adenopathy.   Skin:     General: Skin is warm and dry.   Neurological:  General: No focal deficit present.      Mental Status: She is alert and oriented to person, place, and time. Mental status is at baseline.   Psychiatric:         Mood and Affect: Mood normal.         Behavior: Behavior normal.         Thought Content: Thought content normal.         Judgment: Judgment normal.        Assessment:     1. ASCVD (arteriosclerotic cardiovascular disease)    2. Pure hypercholesterolemia    3. BMI 30.0-30.9,adult        Plan:     See assessment. Discussed continue current treatment, update laboratory with next annual physical exam due on/after 10/21/2022, work accommodation form completed    Hortencia Pilar, MD

## 2022-08-10 ENCOUNTER — Ambulatory Visit
Admission: RE | Admit: 2022-08-10 | Discharge: 2022-08-10 | Disposition: A | Discharge status: Home / Self Care | Payer: Medicaid Other | Source: Ambulatory Visit | Attending: Otolaryngology | Admitting: Otolaryngology

## 2022-08-10 DIAGNOSIS — R1310 Dysphagia, unspecified: Secondary | ICD-10-CM | POA: Diagnosis not present

## 2022-08-10 DIAGNOSIS — F458 Other somatoform disorders: Secondary | ICD-10-CM | POA: Insufficient documentation

## 2022-08-10 NOTE — Progress Notes (Signed)
Modified Barium Swallow Study  Patient Details  Name: Lynn Black MRN: [id number] Date of Birth: [birth year]  Today's Date: 08/10/2022  Modified Barium Swallow completed.  Full report located under Chart Review in the Imaging Section.  History of Present Illness Lynn Black is a 62 y.o. female with past medical history including cervical spondylosis with myelopathy, s/p ACDF C3-4 and C7-T1, GERD (on omeprazole), HTN, dysphonia, and tobacco use referred by Dr. Pryor Ochoa for MBSS due to reports of globus sensation when swallowing. Laryngoscopy on 07/15/22 showed MTD, mild bowing of vocal cords, and mild reflux changes. Pt reported "not being able to start the swallowing process."   Clinical Impression Patient presents with multifactorial oropharyngeal and pharyngoesophageal dysphagia. While underlying structural changes are felt to be contributory to pt's dysphagia (position of cervical spine, ACDF hardware which appears to narrow pharyngeal space, impede epiglottic deflection) motor and sensory impairments are noted as well. Oral phase is characterized by slow bolus transport and incomplete/disorganized mastication. Patient is noted to exert head forward, appearing to attempt to compensate for pharyngeal weakness/decreased pharyngeal stripping. Swallow initiation is delayed to the level of the pyriform sinuses. There was laryngeal penetration and aspiration before the swallow, which was silent, as well as of residue after the swallow (also silent) with thin liquids. Incomplete hyoid excursion, and laryngeal closure, as well as incomplete epiglottic deflection, lead to retention of all consistencies and penetration during/after the swallow. Amplitude and duration of pharyngoesophageal segment opening is impaired, which also impacts bolus clearance. 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 Most effective strategies were chin tuck in combination with multiple swallows, as well as liquid wash, which reduced residue and prevented laryngeal penetration/ aspiration. Recommend she utilize these strategies for all swallows; soft/moistened solids and thin liquids, with meds crushed in puree. Recommend course of OP ST for dysphagia for targeted exercise program to address hyolaryngeal strengthening and duration of PE segment opening. She may also benefit from OP ST for MTD/vocal fold bowing. Factors that may increase risk of adverse event in presence of aspiration Phineas Douglas & Padilla 2021):    Swallow Evaluation Recommendations Recommendations: PO diet Liquid Administration via: Cup Medication Administration: Crushed with puree Supervision: Patient able to self-feed Swallowing strategies  : Slow rate;Small bites/sips;Multiple dry swallows after each bite/sip;Chin tuck;Clear throat intermittently Postural changes: Position pt fully upright for meals;Stay upright 30-60 min after meals Oral care recommendations: Oral care BID (2x/day) Recommended consults: Consider esophageal assessment;Other(comment) (consider neuro consultation)    Deneise Lever, [place], CCC-SLP Speech-Language Pathologist [phone]   Aliene Altes 08/10/2022,5:57 PM

## 2022-08-21 ENCOUNTER — Other Ambulatory Visit (INDEPENDENT_AMBULATORY_CARE_PROVIDER_SITE_OTHER): Payer: Self-pay | Admitting: Family Medicine

## 2022-08-21 MED ORDER — OZEMPIC (1 MG/DOSE) 4 MG/3ML SC SOPN
1.0000 mg | PEN_INJECTOR | SUBCUTANEOUS | 0 refills | Status: DC
Start: 2022-08-21 — End: 2022-11-09

## 2022-08-22 ENCOUNTER — Encounter (INDEPENDENT_AMBULATORY_CARE_PROVIDER_SITE_OTHER): Payer: Self-pay | Admitting: Family Medicine

## 2022-08-25 ENCOUNTER — Ambulatory Visit: Payer: 59 | Attending: Otolaryngology | Admitting: Speech Pathology

## 2022-08-25 DIAGNOSIS — R490 Dysphonia: Secondary | ICD-10-CM | POA: Insufficient documentation

## 2022-08-25 DIAGNOSIS — R1312 Dysphagia, oropharyngeal phase: Secondary | ICD-10-CM | POA: Insufficient documentation

## 2022-08-25 NOTE — Therapy (Unsigned)
OUTPATIENT SPEECH LANGUAGE PATHOLOGY VOICE EVALUATION   Patient Name: Lynn Black MRN: [id number] DOB:[birth year], 62 y.o., female Today's Date: 08/26/2022  PCP: Macarthur Critchley, MD  REFERRING PROVIDER: Carloyn Manner, MD    End of Session - 08/25/22 1543     Visit Number 1    Number of Visits 17    Date for SLP Re-Evaluation 10/20/22    Authorization Type [place] - Visit Number 1    Progress Note Due on Visit 10    SLP Start Time 1445    SLP Stop Time  1545    SLP Time Calculation (min) 60 min    Activity Tolerance Patient tolerated treatment well             Past Medical History:  Diagnosis Date   Eczema    GERD (gastroesophageal reflux disease)    Heart murmur    " nothing to wory about"   Pruritus    Psoriasis    Seasonal allergies    Smoking    Vitamin B12 deficiency 05/2019   Vitamin D deficiency 05/2019   Wears dentures    partial upper and lower   Past Surgical History:  Procedure Laterality Date   ANTERIOR CERVICAL DECOMP/DISCECTOMY FUSION N/A 10/26/2016   Procedure: Cervical four-five, Cervical five-six  Anterior cervical decompression/discectomy/fusion;  Surgeon: Kristeen Miss, MD;  Location: [facility];  Service: Neurosurgery;  Laterality: N/A;   ANTERIOR CERVICAL DECOMP/DISCECTOMY FUSION N/A 10/24/2020   Procedure: Cervical three-four, Cervical seven-Thoracic one Anterior cervical decompression/discectomy fusion;  Surgeon: Kristeen Miss, MD;  Location: [facility];  Service: Neurosurgery;  Laterality: N/A;   CERVICAL DISCECTOMY     OPEN REDUCTION INTERNAL FIXATION (ORIF) DISTAL RADIAL FRACTURE Left 03/27/2022   Procedure: OPEN REDUCTION INTERNAL FIXATION (ORIF) DISTAL RADIUS FRACTURE;  Surgeon: Hessie Knows, MD;  Location: [facility];  Service: Orthopedics;  Laterality: Left;  Latex   ROTATOR CUFF REPAIR Right    Patient Active Problem List   Diagnosis Date Noted   Cervical spondylosis with myelopathy  and radiculopathy 10/24/2020   Hypertension 09/12/2018   Tobacco dependence 10/15/2017   Pruritus 10/15/2017   Neuropathic pain 10/15/2017   Psoriasis 03/16/2017   Cervical arthritis with myelopathy 10/26/2016    Onset date: 10/21/21  REFERRING DIAG: R13.10 (ICD-10-CM) - Dysphagia, unspecified; dysphonia (R49.0)  THERAPY DIAG:  Dysphonia  Dysphagia, oropharyngeal phase  Rationale for Evaluation and Treatment Rehabilitation  SUBJECTIVE:   SUBJECTIVE STATEMENT: Pt appeared in a pleasant mood. Required numerous repetitions of instructions as she didn't hear the first time. SLP instructed pt to schedule an ENT appt to get an audiological assessment.  Pt accompanied by: self  PERTINENT HISTORY: Pt is a 62 y/o female referred for multifactorial oropharyngeal and pharyngoesophageal dysphagia and dysphonia. Pt presents with a history of cervical spondylosis with myelopathy, GERD (taking omeprazole), dysphonia, tobacco use, and Globus sensation when swallowing.   DIAGNOSTIC FINDINGS:  Laryngoscopy on Oct 21, 2021:  Larynx:  Interarytenoid notch: interarytenoid pachydermia.  Left corniculate (arytenoid): interarytenoid pachydermia  Mild bilateral bowing of vocal folds and Bilateral muscle tension dysphonia with touching of false vocal folds with phonation,  Dx: LPR treated with omeprazole   Laryngoscopy 12/05/21  Larynx (by mirror exam):  Interarytenoid notch: interarytenoid pachydermia.  Left corniculate (arytenoid): interarytenoid pachydermia  Mild bilateral bowing of vocal folds and Bilateral muscle tension dysphonia with touching of false vocal folds with phonation,   Laryngoscopy on 07/15/22   Larynx:  Mild  bilateral bowing of vocal folds and Bilateral muscle tension dysphonia with touching of false vocal folds with phonation      MBSS 08/10/22 Patient presents with multifactorial oropharyngeal and pharyngoesophageal dysphagia. While underlying structural changes are felt to  be contributory to pt's dysphagia (position of cervical spine, ACDF hardware which appears to narrow pharyngeal space, impede epiglottic deflection) motor and sensory impairments are noted as well.There was laryngeal penetration and aspiration before the swallow, which was silent, as well as of residue after the swallow (also silent) with thin liquids. Incomplete hyoid excursion, and laryngeal closure, as well as incomplete epiglottic deflection, lead to retention of all consistencies and penetration during/after the swallow. Amplitude and duration of pharyngoesophageal segment opening is impaired, which also impacts bolus clearance. 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 Most effective strategies were chin tuck in combination with multiple swallows, as well as liquid wash, which reduced residue and prevented laryngeal penetration/ aspiration.  Recommendations/Plan: Swallowing strategies  : Slow rate; Small bites/sips; Multiple dry swallows after each bite/sip; Chin tuck; Clear throat intermittently Postural changes: Position pt fully upright for meals; Stay upright 30-60 min after meals Recommended consults: Consider esophageal assessment; Other(comment) (consider neuro consultation)    PAIN:  Are you having pain? No   FALLS: Has patient fallen in last 6 months? No, Number of falls: N/A   LIVING ENVIRONMENT: Lives with: lives with their family and lives with their son Lives in: House/apartment  PLOF: Independent  PATIENT GOALS to fix voice   OBJECTIVE:   COGNITION: Overall cognitive status: Impaired; needs further assessment Areas of impairment:  Functional deficits: pt had difficulty recalling recent compensatory swallow strategies as well as several other pieces of  information throughout the session. Difficult to assess if this is related to pt's apparent hearing difficulty or true cognitive deficits.   The "[place] Mental Status" (SLUMS) Examination was administered. Pt scored **/30, raising concern for the presence of a neurocognitive disorder. Further testing would be beneficial, as deficits of attention, memory, oriantion, problem solving, and executive functions identified today may negatively impact pt safety with independent living.   SLUMS Examination Orientation  3/3  Numeric Problem Solving  2/3  Memory  4/5  Attention 0/2  Thought Organization 3/3  Clock Drawing 0/4  Visuospatial Skills               1/2  Short Story Recall  4/8  Total  17/30     Scoring  High School Education  Less than High School Education   Normal  27-30 25-30  Mild Neurocognitive Disorder 21-26 20-24  Dementia  1-20 1-19    SOCIAL HISTORY: Occupation: Insurance claims handler intake: suboptimal Caffeine/alcohol intake: minimal Daily voice use: moderate Environmental risks: Noisy environment, Dry or dusty environment, and [employer] environment Occupational risks: None identified and Other:  Misuse: Speaks excessively Phonotraumatic behaviors: Excessive and/or habitual throat clearing  PERCEPTUAL VOICE ASSESSMENT: Voice quality: hoarse, harsh, rough, and strained Vocal abuse: habitual throat clearing Resonance: normal Respiratory function: diaphragmatic/abdominal breathing  OBJECTIVE VOICE ASSESSMENT: Sustained "ah" maximum phonation time: 9.1 seconds Sustained "ah" loudness average: 74.6 dB Average fundamental frequency during sustained "ah":194.3 Hz   (1.8 SD below average of  244 Hz +/- 27 for gender)  Oral reading (passage) loudness average: 77 dB Oral reading loudness range: 10 dB Conversational pitch  average: 185 Hz Highest dynamic pitch in conversational speech: 257 Hz Lowest dynamic pitch in conversational speech: 150 Hz Conversational pitch  range: 106 Hz Conversational loudness average: 81 dB Conversational loudness range: 14 dB S/z ratio: 1.47 (Suggestive of dysfunction >1.0) Voice quality: hoarse, harsh, rough, strained, and low vocal intensity      ORAL MOTOR EXAMINATION Facial : WFL Lingual: WFL Velum: WFL Mandible: WFL Cough: WFL Voice: WFL    PATIENT REPORTED OUTCOME MEASURES (PROM):  VOICE HANDICAP INDEX (VHI)  The Voice Handicap Index is comprised of a series of questions to assess the patient's perception of their voice. It is designed to evaluate the emotional, physical and functional components of the voice problem.  Functional: 4 Physical: 13 Emotional: 2 Total: 19 (Normal mean 8.75, SD =14.97)  z score =  0.685 (total score -8.75 / 14.97) no significant impact = 0-1.00, mild = 1.01-1.99, moderate = 2.00-2.99, severe = 3.00+ and @REFLUXSEVERITYINDEX @  Reflux Symptom Index These are statements many people have used to describe their voices and the effects of their voices on their lives. In the last  month, how did the following problems affect you?   Hoarseness or a problem with your voice 4=Severe problem  Clearing your throat 1=Very mild problem  Excess throat mucous or postnasal drip 4=Severe problem  Difficulty swallowing food, liquids, or pills 4=Severe problem  Coughing after you eat or after lying down 1=Very mild problem  Breathing difficulties or choking episodes 1=Very mild problem  Troublesome or annoying cough 1=Very mild problem  Sensation of something sticking in your throat or a lump in your throat 1=Very mild problem  Heartburn, chest pain, indigestion, or stomach acid coming up 4=Severe problem   Total: 25    Normative data suggests that an RSI of greater than or equal to 13 is clinically significant  Therefore, an RSI > 13 may be indicative of significant reflux disease.    EATING ASSESSMENT TOOL (EAT-10)   The patient was asked to rate to what extent the following  statements are problematic on a scale of 0-4. 0 = No problem; 4 = Severe problem. A total score of 3 or higher is considered abnormal.  1.) My swallowing problem has caused me to lose weight. 0 2.) My swallowing problem interferes with my ability to go out for meals. 2 3.) Swallowing liquids takes extra effort. 0 4.) Swallowing solids takes extra effort. 4   5.) Swallowing pills takes extra effort. 4  6.) Swallowing is painful. 0 7.) The pleasure of eating is affected by my swallowing. 4  8.) When I swallow food sticks in my throat. 4  9.) I cough when I eat. 4  10.) Swallowing is stressful. 0   TOTAL SCORE: 22   TODAY'S TREATMENT:  SLP provided continued education on use of chin tuck when consuming thin liquids. To aid in pt recall and use, pt was given post-it notes to put reminders on her kitchen table, beside her chair.   PATIENT EDUCATION: Education details: Above Results  Person educated: Patient Education method: Customer service manager Education comprehension: verbalized understanding   HOME EXERCISE PROGRAM: N/A  GOALS: Goals reviewed with patient? Yes  SHORT TERM GOALS: Target date: 10 sessions  The patient will decrease laryngeal and articulatory muscle tension by independently completing relaxation/stretching exercises.  Baseline: Goal status: INITIAL  2.  The patient will demonstrate abdominal breathing patterns and steady release of breath on exhalation to optimize efficiency of voicing and decrease laryngeal hyperfunction.  Baseline:  Goal status: INITIAL  3.  Pt will complete 3 sets of pharyngeal strengthening exercises without fatigue over 5 out of 7 sessions.   Baseline:  Goal status: INITIAL  4.  With minimal assistance, pt with demonstrate increased understanding of dysphagia and aspiration by answering WH-questions with 75% accuracy.  Baseline:  Goal status: INITIAL   LONG TERM GOALS: Target date: 10/20/2022  Patient will consume  recommended diet using strategies and compensations without overt s/sx aspiration >95% of the time. Baseline:  Goal status: INITIAL  2.  Patient will improve perception of swallowing as indicated by an improvement in EAT-10 score to 5 (Baseline = 10) by 12 weeks from initial swallowing therapy session. Baseline:  Goal status: INITIAL  3.  Pt will improve vocal quality, vocal hygiene, voice projection, and prevent vocal fatigue.   Baseline:  Goal status: INITIAL   ASSESSMENT:  CLINICAL IMPRESSION: Patient is a 62 y.o. female who was seen today for oropharyngeal and pharyngoesophageal dysphagia, and dysphonia. Pt presents with impaired swallowing ability, characterized by a delayed swallow. Pt instructed to utilize swallowing strategy of chin tuck while eating and drinking to initiate a swallow.  Pt presents with dysphonia characterized by a hoarse, strained voice. While pt reports no deficits in memory at home, pt presents with impaired cognitive abilities when given a cognitive standardized assessment. A barrier to accurately answering the questions on the assessment was the pts inability to hear instructions without multiple repetitions. SLP instructed pt schedule audiological assessment with [facility]. Pt appeared to be have difficulty recalling information from past doctors appointments, concerning compensatory strategies and was unable to provide clear information regarding various personal questions throughout the assessment.    OBJECTIVE IMPAIRMENTS include attention, awareness, executive functioning, voice disorder, and dysphagia. These impairments are limiting patient from effectively communicating at home and in community and safety when swallowing. Factors affecting potential to achieve goals and functional outcome are ability to learn/carryover information, co-morbidities, cooperation/participation level, and previous level of function.. Patient will benefit from skilled SLP  services to address above impairments and improve overall function.  REHAB POTENTIAL: Good  PLAN: SLP FREQUENCY: 1-2x/week  SLP DURATION: 8 weeks  PLANNED INTERVENTIONS: Aspiration precaution training, Pharyngeal strengthening exercises, SLP instruction and feedback, and Compensatory strategies    Alphonzo Grieve, SLP Graduate Clinician

## 2022-08-26 ENCOUNTER — Telehealth (INDEPENDENT_AMBULATORY_CARE_PROVIDER_SITE_OTHER): Payer: Self-pay

## 2022-08-26 ENCOUNTER — Encounter (INDEPENDENT_AMBULATORY_CARE_PROVIDER_SITE_OTHER): Payer: Self-pay | Admitting: Family Medicine

## 2022-08-26 ENCOUNTER — Other Ambulatory Visit (INDEPENDENT_AMBULATORY_CARE_PROVIDER_SITE_OTHER): Payer: Self-pay | Admitting: Family Medicine

## 2022-08-26 DIAGNOSIS — Z6830 Body mass index (BMI) 30.0-30.9, adult: Secondary | ICD-10-CM

## 2022-08-26 MED ORDER — SEMAGLUTIDE (1 MG/DOSE) 4 MG/3ML SC SOPN
1.0000 mg | PEN_INJECTOR | SUBCUTANEOUS | 1 refills | Status: DC
Start: 2022-08-26 — End: 2022-11-09

## 2022-08-26 NOTE — Telephone Encounter (Signed)
Zolpidem Tartrate ER 12.5MG  er tablets  Key: [id number]   PA Case ID: [id number]  Prior authorization submitted using Covermymeds  APPROVED  07/27/2022 through 08/26/2023    Prior Authorization Questions:  1.What is the patient's age in years? 22    2.What is the patient's diagnosis? Insomnia    3.Does the prescriber agree to discontinue the sedative hypnotic if the patient experiences a complex sleep behavior, such as sleep-walking or sleep-driving? YES    4.Will this medication be used in combination with any other Prior Authorization (PA) sleep aids or an oxybate product such as Xyrem or Xywav?  No.    5.Which strength of medication is being requested? 12.5mg     6.How many tablets will the patient need for a 90 day supply? [name]

## 2022-09-01 ENCOUNTER — Ambulatory Visit: Payer: 59 | Admitting: Speech Pathology

## 2022-09-02 ENCOUNTER — Ambulatory Visit: Payer: 59 | Admitting: Speech Pathology

## 2022-09-07 ENCOUNTER — Other Ambulatory Visit (INDEPENDENT_AMBULATORY_CARE_PROVIDER_SITE_OTHER): Payer: Self-pay | Admitting: Family Medicine

## 2022-09-07 DIAGNOSIS — Z6830 Body mass index (BMI) 30.0-30.9, adult: Secondary | ICD-10-CM

## 2022-09-07 MED ORDER — SEMAGLUTIDE-WEIGHT MANAGEMENT 1 MG/0.5ML SC SOAJ
1.0000 mg | SUBCUTANEOUS | 0 refills | Status: AC
Start: 2022-09-07 — End: 2022-09-29

## 2022-09-08 ENCOUNTER — Telehealth (INDEPENDENT_AMBULATORY_CARE_PROVIDER_SITE_OTHER): Payer: Self-pay

## 2022-09-08 ENCOUNTER — Encounter: Payer: Medicaid Other | Admitting: Speech Pathology

## 2022-09-08 NOTE — Telephone Encounter (Signed)
Called pt to advise printed rx for wegovy is ready for pick up at the front desk , no answer, left detailed message rx ready for pick up.

## 2022-09-09 ENCOUNTER — Telehealth (INDEPENDENT_AMBULATORY_CARE_PROVIDER_SITE_OTHER): Payer: Self-pay

## 2022-09-09 NOTE — Telephone Encounter (Signed)
Wegovy 1MG /0.5ML auto-injectors  Key: [id number]  Rx #: [id number]  Prior authorization submitted using Covermymeds  APPROVED  09/09/2022 - 03/08/2023    Patient was notified using MyChartMsg    Prior Authorization Questions:  1.What is the patient's age in years? 83    2.What is the patients diagnosis? Obesity, used for chronic weight management    3.Will the patient use this medication in combination with lifestyle changes and a reduced calorie diet? YES    4.Has the patient been on this medication continuously for the last 4 months excluding samples? NO    5.Has the patient participated in a comprehensive weight management program that encourages behavioral modification, reduced calorie diet, and increased physical activity with continuing follow-up for at least 3 months prior to using this medication? YES    6.Please confirm the patient's age. 39 years of age or older    33.What is the patient's body mass index (BMI) in kilograms per square meter (kg/m2)? Greater than or equal to 30 kg/m2    8.Is the patient currently using another glucagon-like peptide-1 (GLP-1) receptor agonist? No, not using another GLP-1.    9.Will this medication be used in combination with another Prior Authorization (PA) medication for weight loss? No    10.Will the patient need more than 12 single-dose pens every 84 days? No    11.Is this medication being requested as a change from Zepbound to allow the member access to their copay benefit? No

## 2022-09-10 ENCOUNTER — Ambulatory Visit: Payer: 59 | Attending: Otolaryngology | Admitting: Speech Pathology

## 2022-09-10 DIAGNOSIS — R1312 Dysphagia, oropharyngeal phase: Secondary | ICD-10-CM | POA: Insufficient documentation

## 2022-09-10 DIAGNOSIS — R490 Dysphonia: Secondary | ICD-10-CM | POA: Diagnosis not present

## 2022-09-10 NOTE — Therapy (Signed)
OUTPATIENT SPEECH LANGUAGE PATHOLOGY TREATMENT NOTE   Patient Name: Lynn Black MRN: [id number] DOB:10/19/1960, 62 y.o., female Today's Date: 09/10/2022  PCP: Mia Creek, MD REFERRING PROVIDER: Carloyn Manner, MD  END OF SESSION:   End of Session - 09/10/22 1501     Visit Number 2    Number of Visits 17    Date for SLP Re-Evaluation 10/20/22    Authorization Type [employer] [place] - Visit Number 2    Progress Note Due on Visit 10    SLP Start Time 1500    SLP Stop Time  1545    SLP Time Calculation (min) 45 min    Activity Tolerance Patient tolerated treatment well             Past Medical History:  Diagnosis Date   Eczema    GERD (gastroesophageal reflux disease)    Heart murmur    " nothing to wory about"   Pruritus    Psoriasis    Seasonal allergies    Smoking    Vitamin B12 deficiency 05/2019   Vitamin D deficiency 05/2019   Wears dentures    partial upper and lower   Past Surgical History:  Procedure Laterality Date   ANTERIOR CERVICAL DECOMP/DISCECTOMY FUSION N/A 10/26/2016   Procedure: Cervical four-five, Cervical five-six  Anterior cervical decompression/discectomy/fusion;  Surgeon: Kristeen Miss, MD;  Location: [place];  Service: Neurosurgery;  Laterality: N/A;   ANTERIOR CERVICAL DECOMP/DISCECTOMY FUSION N/A 10/24/2020   Procedure: Cervical three-four, Cervical seven-Thoracic one Anterior cervical decompression/discectomy fusion;  Surgeon: Kristeen Miss, MD;  Location: [place];  Service: Neurosurgery;  Laterality: N/A;   CERVICAL DISCECTOMY     OPEN REDUCTION INTERNAL FIXATION (ORIF) DISTAL RADIAL FRACTURE Left 03/27/2022   Procedure: OPEN REDUCTION INTERNAL FIXATION (ORIF) DISTAL RADIUS FRACTURE;  Surgeon: Hessie Knows, MD;  Location: [place];  Service: Orthopedics;  Laterality: Left;  Latex   ROTATOR CUFF REPAIR Right    Patient Active Problem List   Diagnosis Date Noted   Cervical spondylosis with  myelopathy and radiculopathy 10/24/2020   Hypertension 09/12/2018   Tobacco dependence 10/15/2017   Pruritus 10/15/2017   Neuropathic pain 10/15/2017   Psoriasis 03/16/2017   Cervical arthritis with myelopathy 10/26/2016    Onset date: 10/21/21   REFERRING DIAG: R13.10 (ICD-10-CM) - Dysphagia, unspecified; dysphonia (R49.0)   PERTINENT HISTORY: Pt is a 62 y/o female referred for multifactorial oropharyngeal and pharyngoesophageal dysphagia and dysphonia. Pt presents with a history of cervical spondylosis with myelopathy, GERD (taking omeprazole), dysphonia, tobacco use, and Globus sensation when swallowing.    DIAGNOSTIC FINDINGS:  Laryngoscopy on Oct 21, 2021:  Larynx:  Interarytenoid notch: interarytenoid pachydermia.  Left corniculate (arytenoid): interarytenoid pachydermia  Mild bilateral bowing of vocal folds and Bilateral muscle tension dysphonia with touching of false vocal folds with phonation,  Dx: LPR treated with omeprazole    Laryngoscopy 12/05/21  Larynx (by mirror exam):  Interarytenoid notch: interarytenoid pachydermia.  Left corniculate (arytenoid): interarytenoid pachydermia  Mild bilateral bowing of vocal folds and Bilateral muscle tension dysphonia with touching of false vocal folds with phonation,    Laryngoscopy on 07/15/22              Larynx:  Mild bilateral bowing of vocal folds and Bilateral muscle tension dysphonia with touching of false vocal folds with phonation          MBSS 08/10/22 Patient presents with multifactorial oropharyngeal and pharyngoesophageal dysphagia. While underlying structural changes  are felt to be contributory to pt's dysphagia (position of cervical spine, ACDF hardware which appears to narrow pharyngeal space, impede epiglottic deflection) motor and sensory impairments are noted as well.There was laryngeal penetration and aspiration before the swallow, which was silent, as well as of residue after the swallow (also silent) with thin  liquids. Incomplete hyoid excursion, and laryngeal closure, as well as incomplete epiglottic deflection, lead to retention of all consistencies and penetration during/after the swallow. Amplitude and duration of pharyngoesophageal segment opening is impaired, which also impacts bolus clearance. 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 Most effective strategies were chin tuck in combination with multiple swallows, as well as liquid wash, which reduced residue and prevented laryngeal penetration/ aspiration.   Recommendations/Plan: Swallowing strategies  : Slow rate; Small bites/sips; Multiple dry swallows after each bite/sip; Chin tuck; Clear throat intermittently Postural changes: Position pt fully upright for meals; Stay upright 30-60 min after meals Recommended consults: Consider esophageal assessment; Other(comment) (consider neuro consultation)  THERAPY DIAG:  Dysphonia  Dysphagia, oropharyngeal phase  Rationale for Evaluation and Treatment Rehabilitation  SUBJECTIVE: pt pleasant, had questions about her denture adhesive  Pt accompanied by: self  PAIN:  Are you having pain? No  PATIENT GOALS: to fix her voice  OBJECTIVE:   TODAY'S TREATMENT: Skilled treatment session focused on pt's dysphonia. SLP facilitated session by providing the following interventions:  Pt was independent with recall and use of chin tuck with consumption of thin liquids.   She reports making an appointment with audiology ("sometime next month"). Inability to effectively hear continues to be a barrier throughout session.   SLP provided instruction neck stretches. Pt able to follow independently.   Patient instructed in flow phonation through skill level 1: establish airflow release:  Unarticulated: moderate faded to minimal assistance to establish airflow release; Articulated (unvoiced): pt benefited from maximal multi-modal assistance to achieve ~ 50% accuracy with articulating sounds  Initiated skill level 2-  Airflow + Voicing: Unarticulated:  some initial good quality voicing noted      PATIENT EDUCATION: Education details: see above Person educated: Patient Education method: Explanation Education comprehension: needs further education  HOME EXERCISE PROGRAM:  Practice stretches Voiceless practice of articulated words/phrases  GOALS: Goals reviewed with patient? Yes  GOALS: Goals reviewed with patient? Yes   SHORT TERM GOALS: Target date: 10 sessions   The patient will decrease laryngeal and articulatory muscle tension by independently completing relaxation/stretching exercises.   Baseline: Goal status: INITIAL   2.  The patient will demonstrate abdominal breathing patterns and steady release of breath on exhalation to optimize efficiency of voicing and decrease laryngeal hyperfunction.   Baseline:  Goal status: INITIAL   3.  Pt will complete 3 sets of pharyngeal strengthening exercises without fatigue over 5 out of 7 sessions.   Baseline:  Goal status: INITIAL   4.  With minimal assistance, pt with demonstrate increased understanding of dysphagia and aspiration by answering WH-questions with 75% accuracy.  Baseline:  Goal status: INITIAL     LONG TERM GOALS: Target date: 10/20/2022   Patient will consume recommended diet using strategies and compensations without overt s/sx aspiration >95% of the time. Baseline:  Goal status: INITIAL   2.  Patient will improve perception of swallowing as indicated by an improvement in EAT-10 score to 5 (Baseline = 10)  by 12 weeks from initial swallowing therapy session. Baseline:  Goal status: INITIAL   3.  Pt will improve vocal quality, vocal hygiene, voice projection, and prevent vocal fatigue.     Baseline:  Goal status: INITIAL  ASSESSMENT:  CLINICAL IMPRESSION: Pt presents with intermittent dysphonia today c/b hoarse, strained vocal quality.   OBJECTIVE IMPAIRMENTS include attention, awareness, executive functioning, voice disorder, and dysphagia. These impairments are limiting patient from effectively communicating at home and in community and safety when swallowing. Factors affecting potential to achieve goals and functional outcome are ability to learn/carryover information, co-morbidities, cooperation/participation level, and previous level of function.. Patient will benefit from skilled SLP services to address above impairments and improve overall function.   REHAB POTENTIAL: Good   PLAN: SLP FREQUENCY: 1-2x/week   SLP DURATION: 8 weeks   PLANNED INTERVENTIONS: Aspiration precaution training, Pharyngeal strengthening exercises, SLP instruction and feedback, and Compensatory strategies         Tyronda Vizcarrondo B. Dreama Saa, M.S., CCC-SLP, Tree surgeon Certified Brain Injury Specialist [facility]  [facility] Rehabilitation Services Office [phone] Ascom [phone] Fax [phone]

## 2022-09-11 ENCOUNTER — Encounter (INDEPENDENT_AMBULATORY_CARE_PROVIDER_SITE_OTHER): Payer: Self-pay

## 2022-09-15 ENCOUNTER — Encounter: Payer: Medicaid Other | Admitting: Speech Pathology

## 2022-09-15 DIAGNOSIS — R936 Abnormal findings on diagnostic imaging of limbs: Secondary | ICD-10-CM | POA: Diagnosis not present

## 2022-09-15 DIAGNOSIS — M19012 Primary osteoarthritis, left shoulder: Secondary | ICD-10-CM | POA: Diagnosis not present

## 2022-09-15 DIAGNOSIS — M79622 Pain in left upper arm: Secondary | ICD-10-CM | POA: Diagnosis not present

## 2022-09-17 ENCOUNTER — Encounter: Payer: Medicaid Other | Admitting: Speech Pathology

## 2022-09-22 ENCOUNTER — Encounter: Payer: Medicaid Other | Admitting: Speech Pathology

## 2022-09-23 ENCOUNTER — Encounter: Payer: Medicaid Other | Admitting: Speech Pathology

## 2022-09-23 DIAGNOSIS — F17210 Nicotine dependence, cigarettes, uncomplicated: Secondary | ICD-10-CM | POA: Diagnosis not present

## 2022-09-23 DIAGNOSIS — Z122 Encounter for screening for malignant neoplasm of respiratory organs: Secondary | ICD-10-CM | POA: Diagnosis not present

## 2022-09-23 DIAGNOSIS — R918 Other nonspecific abnormal finding of lung field: Secondary | ICD-10-CM | POA: Diagnosis not present

## 2022-09-23 DIAGNOSIS — I2510 Atherosclerotic heart disease of native coronary artery without angina pectoris: Secondary | ICD-10-CM | POA: Diagnosis not present

## 2022-09-24 ENCOUNTER — Encounter: Payer: Medicaid Other | Admitting: Speech Pathology

## 2022-09-29 ENCOUNTER — Encounter: Payer: Medicaid Other | Admitting: Speech Pathology

## 2022-10-01 ENCOUNTER — Other Ambulatory Visit (INDEPENDENT_AMBULATORY_CARE_PROVIDER_SITE_OTHER): Payer: Self-pay | Admitting: Family Medicine

## 2022-10-01 ENCOUNTER — Encounter: Payer: Medicaid Other | Admitting: Speech Pathology

## 2022-10-05 ENCOUNTER — Encounter: Payer: Medicaid Other | Admitting: Speech Pathology

## 2022-10-07 ENCOUNTER — Encounter: Payer: Medicaid Other | Admitting: Speech Pathology

## 2022-10-08 ENCOUNTER — Ambulatory Visit (INDEPENDENT_AMBULATORY_CARE_PROVIDER_SITE_OTHER): Payer: BLUE CROSS/BLUE SHIELD | Admitting: Family Medicine

## 2022-10-08 ENCOUNTER — Encounter (INDEPENDENT_AMBULATORY_CARE_PROVIDER_SITE_OTHER): Payer: Self-pay | Admitting: Family Medicine

## 2022-10-08 ENCOUNTER — Encounter: Payer: Medicaid Other | Admitting: Speech Pathology

## 2022-10-08 VITALS — BP 122/86 | HR 79 | Temp 98.4°F | Ht 66.0 in | Wt 185.0 lb

## 2022-10-08 DIAGNOSIS — Z5181 Encounter for therapeutic drug level monitoring: Secondary | ICD-10-CM

## 2022-10-08 DIAGNOSIS — Z1159 Encounter for screening for other viral diseases: Secondary | ICD-10-CM

## 2022-10-08 DIAGNOSIS — Z0000 Encounter for general adult medical examination without abnormal findings: Secondary | ICD-10-CM

## 2022-10-08 DIAGNOSIS — E7800 Pure hypercholesterolemia, unspecified: Secondary | ICD-10-CM

## 2022-10-08 LAB — URINALYSIS WITH MICROSCOPIC EXAM
Bilirubin, UA: NEGATIVE
Blood, UA: NEGATIVE
Glucose, UA: NEGATIVE
Ketones UA: NEGATIVE
Leukocyte Esterase, UA: NEGATIVE
Nitrite, UA: NEGATIVE
Protein, UR: NEGATIVE
Specific Gravity UA: 1.01 (ref 1.001–1.035)
Urine pH: 6 (ref 5.0–8.0)
Urobilinogen, UA: NORMAL mg/dL

## 2022-10-08 LAB — CBC AND DIFFERENTIAL
Absolute NRBC: 0 10*3/uL (ref 0.00–0.00)
Basophils Absolute Automated: 0.04 10*3/uL (ref 0.00–0.08)
Basophils Automated: 0.9 %
Eosinophils Absolute Automated: 0.06 10*3/uL (ref 0.00–0.44)
Eosinophils Automated: 1.3 %
Hematocrit: 41.5 % (ref 34.7–43.7)
Hgb: 14.5 g/dL (ref 11.4–14.8)
Immature Granulocytes Absolute: 0.01 10*3/uL (ref 0.00–0.07)
Immature Granulocytes: 0.2 %
Instrument Absolute Neutrophil Count: 2.61 10*3/uL (ref 1.10–6.33)
Lymphocytes Absolute Automated: 1.51 10*3/uL (ref 0.42–3.22)
Lymphocytes Automated: 32.8 %
MCH: 30.9 pg (ref 25.1–33.5)
MCHC: 34.9 g/dL (ref 31.5–35.8)
MCV: 88.3 fL (ref 78.0–96.0)
MPV: 11.3 fL (ref 8.9–12.5)
Monocytes Absolute Automated: 0.37 10*3/uL (ref 0.21–0.85)
Monocytes: 8 %
Neutrophils Absolute: 2.61 10*3/uL (ref 1.10–6.33)
Neutrophils: 56.8 %
Nucleated RBC: 0 /100 WBC (ref 0.0–0.0)
Platelets: 178 10*3/uL (ref 142–346)
RBC: 4.7 10*6/uL (ref 3.90–5.10)
RDW: 13 % (ref 11–15)
WBC: 4.6 10*3/uL (ref 3.10–9.50)

## 2022-10-08 NOTE — Progress Notes (Signed)
Subjective:      Patient ID: Melinda Brown is a 62 y.o. female     Chief Complaint   Patient presents with    Annual Exam     Last pap 2023  Last mammogram 2024  Last colonoscopy 2023        HPI     62 yo female for WWE...GYN: Melinda Brown. Prescription medication: Zolpidem CR 12.5 mg as needed, Weygovy 1 mg qw, Crestor 5 mg qd. no known drug allergy. No tobacco/daily alcohol. 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 Past surgical history: OU cataract, Appendectomy, (benign) breast lumpectomy, (multiple) cosmetic . Cardiac risk factor: Female greater than 55, hyperlipidemia (LDL), ASCVD ((+) CAC 11/23) Melinda Brown). family history: atrial fibrillation/non-prem CAD (father), atrial fibrillation/ILD (mother), No colon cancer. Tdap 09/24/2016, Shingrix (02/16/2019, 04/29/2019), Pfizer (09/04/2019, 09/27/19, 04/09/20, bivalent 04/29/21), influenza 04/29/21.Mammography 2021 benign      Review of Systems   Constitutional: Negative.    HENT: Negative.     Eyes: Negative.    Respiratory: Negative.     Cardiovascular: Negative.    Gastrointestinal: Negative.    Endocrine: Negative.    Genitourinary: Negative.    Musculoskeletal: Negative.    Skin: Negative.    Allergic/Immunologic: Negative.    Neurological: Negative.    Hematological: Negative.    Psychiatric/Behavioral:  Positive for sleep disturbance.    All other systems reviewed and are negative.         BP 122/86   Pulse 79   Temp 98.4 F (36.9 C) (Tympanic)   Ht 1.676 m (5\' 6" )   Wt 83.9 kg (185 lb)   LMP  (LMP Unknown)   SpO2 98%   BMI 29.86 kg/m     Objective:     Physical Exam  Vitals and nursing note reviewed.   Constitutional:       Appearance: Normal appearance.   HENT:      Head: Normocephalic and atraumatic.      Right Ear: External ear normal.      Left Ear: External ear normal.   Eyes:      Extraocular  Movements: Extraocular movements intact.      Conjunctiva/sclera: Conjunctivae normal.      Pupils: Pupils are equal, round, and reactive to light.   Neck:      Vascular: No carotid bruit.   Cardiovascular:      Rate and Rhythm: Normal rate and regular rhythm.      Pulses: Normal pulses.      Heart sounds: Normal heart sounds.   Pulmonary:      Effort: Pulmonary effort is normal.      Breath sounds: Normal breath sounds.   Abdominal:      General: Abdomen is flat.      Palpations: Abdomen is soft.   Musculoskeletal:         General: Normal range of motion.      Cervical back: Normal range of motion and neck supple.      Right lower leg: No edema.      Left lower leg: No edema.   Lymphadenopathy:      Cervical: No cervical adenopathy.   Skin:     General: Skin is warm and dry.   Neurological:      General: No focal deficit present.      Mental Status: She is alert and oriented to person,  place, and time. Mental status is at baseline.   Psychiatric:         Mood and Affect: Mood normal.         Behavior: Behavior normal.         Thought Content: Thought content normal.         Judgment: Judgment normal.        Assessment:     1. Health care maintenance  - CBC and differential  - TSH    2. Pure hypercholesterolemia  - Lipid panel    3. Medication monitoring encounter  - Comprehensive metabolic panel  - Urinalysis with microscopic    4. Need for hepatitis C screening test  - Hepatitis C (HCV) antibody, Total        Plan:     See assessment/orders. Discussed update colonoscopy per gastroenterology, continue current treatment, annual physical exam    Theresa Mulligan, MD

## 2022-10-09 LAB — TSH: TSH: 1.72 u[IU]/mL (ref 0.35–4.94)

## 2022-10-09 LAB — COMPREHENSIVE METABOLIC PANEL
ALT: 24 U/L (ref 0–55)
AST (SGOT): 22 U/L (ref 5–41)
Albumin/Globulin Ratio: 1.5 (ref 0.9–2.2)
Albumin: 4.4 g/dL (ref 3.5–5.0)
Alkaline Phosphatase: 84 U/L (ref 37–117)
Anion Gap: 9 (ref 5.0–15.0)
BUN: 14 mg/dL (ref 7.0–21.0)
Bilirubin, Total: 0.7 mg/dL (ref 0.2–1.2)
CO2: 24 mEq/L (ref 17–29)
Calcium: 10.2 mg/dL (ref 8.5–10.5)
Chloride: 107 mEq/L (ref 99–111)
Creatinine: 0.9 mg/dL (ref 0.4–1.0)
Globulin: 2.9 g/dL (ref 2.0–3.6)
Glucose: 83 mg/dL (ref 70–100)
Potassium: 5.2 mEq/L (ref 3.5–5.3)
Protein, Total: 7.3 g/dL (ref 6.0–8.3)
Sodium: 140 mEq/L (ref 135–145)
eGFR: >60 mL/min/{1.73_m2} (ref >=60)

## 2022-10-09 LAB — LIPID PANEL
Cholesterol / HDL Ratio: 2.8 Index
Cholesterol: 183 mg/dL (ref 0–199)
HDL: 66 mg/dL (ref 40–9999)
LDL Calculated: 93 mg/dL (ref 0–99)
Triglycerides: 120 mg/dL (ref 34–149)
VLDL Calculated: 24 mg/dL (ref 10–40)

## 2022-10-09 LAB — HEMOLYSIS INDEX(SOFT): Hemolysis Index: 4 Index (ref 0–24)

## 2022-10-09 LAB — HEPATITIS C ANTIBODY, TOTAL: Hepatitis C, AB: NONREACTIVE

## 2022-10-12 ENCOUNTER — Telehealth: Payer: Self-pay | Admitting: Speech Pathology

## 2022-10-12 NOTE — Telephone Encounter (Signed)
LVM asking patient if she wanted to resume speech therapy. Left her our main number and my number to call us back and let us know either way.

## 2022-10-13 ENCOUNTER — Encounter: Payer: Medicaid Other | Admitting: Speech Pathology

## 2022-10-14 ENCOUNTER — Encounter: Payer: Medicaid Other | Admitting: Speech Pathology

## 2022-10-14 ENCOUNTER — Encounter: Payer: No Typology Code available for payment source | Admitting: Speech Pathology

## 2022-10-14 ENCOUNTER — Ambulatory Visit: Payer: 59 | Attending: Otolaryngology | Admitting: Speech Pathology

## 2022-10-14 DIAGNOSIS — R1313 Dysphagia, pharyngeal phase: Secondary | ICD-10-CM | POA: Diagnosis not present

## 2022-10-14 DIAGNOSIS — R490 Dysphonia: Secondary | ICD-10-CM | POA: Diagnosis not present

## 2022-10-14 NOTE — Therapy (Unsigned)
OUTPATIENT SPEECH LANGUAGE PATHOLOGY TREATMENT NOTE DISCHARGE SUMMARY   Patient Name: Lynn Black MRN: [id number] DOB:Apr 19, 1961, 62 y.o., female Today's Date: 10/14/2022  PCP: Cheri Kearns, MD REFERRING PROVIDER: Bud Face, MD  END OF SESSION:   End of Session - 10/14/22 1505     Visit Number 3    Number of Visits 17    Date for SLP Re-Evaluation 10/20/22    Authorization Type [place] MEDICAID [place] [employer]    Authorization - Visit Number 3    Progress Note Due on Visit 10    SLP Start Time 1500    SLP Stop Time  1645    SLP Time Calculation (min) 105 min    Activity Tolerance Patient tolerated treatment well             Past Medical History:  Diagnosis Date   Eczema    GERD (gastroesophageal reflux disease)    Heart murmur    " nothing to wory about"   Pruritus    Psoriasis    Seasonal allergies    Smoking    Vitamin B12 deficiency 05/2019   Vitamin D deficiency 05/2019   Wears dentures    partial upper and lower   Past Surgical History:  Procedure Laterality Date   ANTERIOR CERVICAL DECOMP/DISCECTOMY FUSION N/A 10/26/2016   Procedure: Cervical four-five, Cervical five-six  Anterior cervical decompression/discectomy/fusion;  Surgeon: Barnett Abu, MD;  Location: [facility];  Service: Neurosurgery;  Laterality: N/A;   ANTERIOR CERVICAL DECOMP/DISCECTOMY FUSION N/A 10/24/2020   Procedure: Cervical three-four, Cervical seven-Thoracic one Anterior cervical decompression/discectomy fusion;  Surgeon: Barnett Abu, MD;  Location: [facility];  Service: Neurosurgery;  Laterality: N/A;   CERVICAL DISCECTOMY     OPEN REDUCTION INTERNAL FIXATION (ORIF) DISTAL RADIAL FRACTURE Left 03/27/2022   Procedure: OPEN REDUCTION INTERNAL FIXATION (ORIF) DISTAL RADIUS FRACTURE;  Surgeon: Kennedy Bucker, MD;  Location: [facility];  Service: Orthopedics;  Laterality: Left;  Latex   ROTATOR CUFF REPAIR Right    Patient Active Problem List   Diagnosis Date Noted   Cervical  spondylosis with myelopathy and radiculopathy 10/24/2020   Hypertension 09/12/2018   Tobacco dependence 10/15/2017   Pruritus 10/15/2017   Neuropathic pain 10/15/2017   Psoriasis 03/16/2017   Cervical arthritis with myelopathy 10/26/2016    Onset date: 10/21/21   REFERRING DIAG: R13.10 (ICD-10-CM) - Dysphagia, unspecified; dysphonia (R49.0)   PERTINENT HISTORY: Pt is a 62 y/o female referred for multifactorial oropharyngeal and pharyngoesophageal dysphagia and dysphonia. Pt presents with a history of cervical spondylosis with myelopathy, GERD (taking omeprazole), dysphonia, tobacco use, and Globus sensation when swallowing.    DIAGNOSTIC FINDINGS:  Laryngoscopy on Oct 21, 2021:  Larynx:  Interarytenoid notch: interarytenoid pachydermia.  Left corniculate (arytenoid): interarytenoid pachydermia  Mild bilateral bowing of vocal folds and Bilateral muscle tension dysphonia with touching of false vocal folds with phonation,  Dx: LPR treated with omeprazole    Laryngoscopy 12/05/21  Larynx (by mirror exam):  Interarytenoid notch: interarytenoid pachydermia.  Left corniculate (arytenoid): interarytenoid pachydermia  Mild bilateral bowing of vocal folds and Bilateral muscle tension dysphonia with touching of false vocal folds with phonation,    Laryngoscopy on 07/15/22              Larynx:  Mild bilateral bowing of vocal folds and Bilateral muscle tension dysphonia with touching of false vocal folds with phonation      MBSS 08/10/22 Patient presents with multifactorial oropharyngeal and pharyngoesophageal dysphagia. While underlying structural changes are felt  to be contributory to pt's dysphagia (position of cervical spine, ACDF hardware which appears to narrow pharyngeal space, impede epiglottic deflection) motor and sensory impairments are noted as well.There was laryngeal penetration and aspiration before the swallow, which was silent, as well as of residue after the swallow (also silent)  with thin liquids. Incomplete hyoid excursion, and laryngeal closure, as well as incomplete epiglottic deflection, lead to retention of all consistencies and penetration during/after the swallow. Amplitude and duration of pharyngoesophageal segment opening is impaired, which also impacts bolus clearance. 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 Most effective strategies were chin tuck in combination with multiple swallows, as well as liquid wash, which reduced residue and prevented laryngeal penetration/ aspiration.   Recommendations/Plan: Swallowing strategies  : Slow rate; Small bites/sips; Multiple dry swallows after each bite/sip; Chin tuck; Clear throat intermittently Postural changes: Position pt fully upright for meals; Stay upright 30-60 min after meals Recommended consults: Consider esophageal assessment; Other(comment) (consider neuro consultation)  THERAPY DIAG:  Dysphagia, pharyngeal phase  Dysphonia  Rationale for Evaluation and Treatment Rehabilitation  SUBJECTIVE: pt pleasant, had questions about her denture adhesive  Pt accompanied by: self  PAIN:  Are you having pain? No  PATIENT GOALS: to fix her voice  OBJECTIVE:   TODAY'S TREATMENT: Skilled treatment session focused on pt's dysphonia. SLP facilitated session by providing the following interventions:  Pt was independent with recall and use of chin tuck with consumption of thin liquids.   Pt presents after missing one month of therapy with much improved vocal quality. Therefore new objective measures were completed.   While pt's pitch (215 Hz) continues to low, it is closer to the average range for a female and is no longer strained, harsh, breathy, hoarse.   Pt completed the Voice Handicapped  Index and Reflux Severity Index - both with much improved scores.    VOICE HANDICAP INDEX (VHI)  The Voice Handicap Index is comprised of a series of questions to assess the patient's perception of their voice. It is designed to evaluate the emotional, physical and functional components of the voice problem.  Functional: 0 (as compared to 4 at time of evaluation) Physical: 5 (as compared to 13 at time of evaluation) Emotional: 0 (as compared to 2 at time of evaluation) Total: 5 (as compared to 19 at time of evaluation) (Normal mean 8.75, SD =14.97)  z score =  < 0 - no impact   Reflux Symptom Index These are statements many people have used to describe their voices and the effects of their voices on their lives. In the last  month, how did the following problems affect you?   Hoarseness or a problem with your voice 0=No problem  (as compared to 4=severe at time of evaluation) Clearing your throat 0=No problem  (as compared to 1=very mild problem at time of evaluation) Excess throat mucous or postnasal drip 0=No problem  (as compared to 4=severe problem at time of evaluation) Difficulty swallowing food, liquids, or pills 1=Very mild problem  (as compared to 1=very mild problem at time of evaluation) Coughing after you eat or after lying down 0=No problem  (as compared to 1=very mild problem at time of evaluation) Breathing difficulties or choking episodes 0=No problem  (as compared to 1=very mild problem at time of evaluation) Troublesome or annoying cough 0=No  problem  (as compared to 1=very mild problem at time of evaluation) Sensation of something sticking in your throat or a lump in your throat 1=Very mild problem  (as compared to 1=very mild problem at time of evaluation) Heartburn, chest pain, indigestion, or stomach acid coming up 0=No problem  (as compared to 4=severe problem at time of evaluation)  Total: 2 (as compared to 25 at time of evaluation)    Normative data suggests that  an RSI of greater than or equal to 13 is clinically significant  Therefore, an RSI > 13 may be indicative of significant reflux disease.       PATIENT EDUCATION: Education details: see above Person educated: Patient Education method: Explanation Education comprehension: needs further education  HOME EXERCISE PROGRAM:  Geophysical data processor of articulated words/phrases  GOALS: Goals reviewed with patient? Yes  GOALS: Goals reviewed with patient? Yes   SHORT TERM GOALS: Target date: 10 sessions    The patient will decrease laryngeal and articulatory muscle tension by independently completing relaxation/stretching exercises.   Baseline: Goal status: MET   2.  The patient will demonstrate abdominal breathing patterns and steady release of breath on exhalation to optimize efficiency of voicing and decrease laryngeal hyperfunction.   Baseline:  Goal status: MET   3.  Pt will complete 3 sets of pharyngeal strengthening exercises without fatigue over 5 out of 7 sessions.   Baseline:  Goal status: MET   4.  With minimal assistance, pt with demonstrate increased understanding of dysphagia and aspiration by answering WH-questions with 75% accuracy.  Baseline:  Goal status: MET     LONG TERM GOALS: Target date: 10/20/2022   Patient will consume recommended diet using strategies and compensations without overt s/sx aspiration >95% of the time. Baseline:  Goal status: MET   2.  Patient will improve perception of swallowing as indicated by an improvement in EAT-10 score to 5 (Baseline = 10) by 12 weeks from initial swallowing therapy session. Baseline:  Goal status: MET   3.  Pt will improve vocal quality, vocal hygiene, voice projection, and prevent vocal fatigue.    Baseline:  Goal status: MET  ASSESSMENT:  CLINICAL IMPRESSION: At this time, pt presents with improved dysphonia that is no longer handicapping. While she does have confirmed silent  aspiration of thin liquids, she is ambulatory which helps to reduce her risk of developing aspiration pneumonia. Pt has an appointment with neurology d/t concerns with cognitive communication deficits as well as dysphagia.    Faiza Bansal B. Dreama Saa, M.S., CCC-SLP, Tree surgeon Certified Brain Injury Specialist [facility]  [facility] Rehabilitation Services Office [phone] Ascom [phone] Fax [phone]

## 2022-10-15 ENCOUNTER — Encounter: Payer: Medicaid Other | Admitting: Speech Pathology

## 2022-10-20 ENCOUNTER — Encounter: Payer: Medicaid Other | Admitting: Speech Pathology

## 2022-10-22 ENCOUNTER — Ambulatory Visit (INDEPENDENT_AMBULATORY_CARE_PROVIDER_SITE_OTHER): Payer: 59 | Admitting: Diagnostic Neuroimaging

## 2022-10-22 ENCOUNTER — Encounter: Payer: Self-pay | Admitting: Diagnostic Neuroimaging

## 2022-10-22 ENCOUNTER — Telehealth: Payer: Self-pay | Admitting: Diagnostic Neuroimaging

## 2022-10-22 ENCOUNTER — Encounter: Payer: Medicaid Other | Admitting: Speech Pathology

## 2022-10-22 VITALS — BP 171/93 | HR 95 | Ht 62.0 in | Wt 117.0 lb

## 2022-10-22 DIAGNOSIS — R471 Dysarthria and anarthria: Secondary | ICD-10-CM | POA: Diagnosis not present

## 2022-10-22 DIAGNOSIS — R1312 Dysphagia, oropharyngeal phase: Secondary | ICD-10-CM | POA: Diagnosis not present

## 2022-10-22 NOTE — Progress Notes (Signed)
[facility]  PATIENT: Lynn Black DOB: 1961/03/13  REFERRING CLINICIAN: Bud Face, MD HISTORY FROM: patient REASON FOR VISIT: new consult   HISTORICAL  CHIEF COMPLAINT:  Chief Complaint  Patient presents with   Dysphagia    Rm 6 alone Pt is well, reports she started having difficulty swallowing about a month in half ago, currently resolved. States her vocal cords are bowed.     HISTORY OF PRESENT ILLNESS:   62 year old female here for evaluation of dysphagia and dysarthria.  Patient has had at least 1 year of hoarse voice and speech changes.  In the last 1 to 2 months has noted some difficulty with swallowing.  Swallowing difficulty slightly improved.  Voice issues have continued.  She saw ENT and had speech and swallow evaluation.  Patient recommended to have neurology evaluation to rule out other primary neurologic causes of symptoms.  No changes in arms and legs.  No breathing issues.  No numbness or tingling.   REVIEW OF SYSTEMS: Full 14 system review of systems performed and negative with exception of: as per HPI.  ALLERGIES: Allergies  Allergen Reactions   Nickel Rash   Shellfish Allergy     Tested positive for shellfish allergy, unknown reaction   Sulfa Antibiotics Other (See Comments)    Tested positive for sulfa allergy, unknown reaction   Other Hives    [place] dressing    Contrast Media [Iodinated Contrast Media] Swelling    Facial swelling    Onion Hives   Tomato Hives   Latex Hives and Rash    Gloves.  OK with balloons and underwear elastic.   Mixed Feathers Rash   Neosporin [Neomycin-Bacitracin Zn-Polymyx] Rash    HOME MEDICATIONS: Outpatient Medications Prior to Visit  Medication Sig Dispense Refill   clobetasol ointment (TEMOVATE) 0.05 % Apply 1 application topically 2 (two) times daily as needed for rash. (Patient not taking: Reported on 10/22/2022)     DUPIXENT 300 MG/2ML SOPN Inject into the skin. (Patient not  taking: Reported on 10/22/2022)     HYDROcodone-acetaminophen (NORCO/VICODIN) 5-325 MG tablet Take 1 tablet by mouth every 6 (six) hours as needed for moderate pain. (Patient not taking: Reported on 10/22/2022) 12 tablet 0   predniSONE (STERAPRED UNI-PAK 21 TAB) 10 MG (21) TBPK tablet Take 6 tablets on the first day and decrease by 1 tablet each day until finished. (Patient not taking: Reported on 10/22/2022) 21 tablet 0   No facility-administered medications prior to visit.    PAST MEDICAL HISTORY: Past Medical History:  Diagnosis Date   Eczema    GERD (gastroesophageal reflux disease)    Heart murmur    " nothing to wory about"   Pruritus    Psoriasis    Seasonal allergies    Smoking    Vitamin B12 deficiency 05/2019   Vitamin D deficiency 05/2019   Wears dentures    partial upper and lower    PAST SURGICAL HISTORY: Past Surgical History:  Procedure Laterality Date   ANTERIOR CERVICAL DECOMP/DISCECTOMY FUSION N/A 10/26/2016   Procedure: Cervical four-five, Cervical five-six  Anterior cervical decompression/discectomy/fusion;  Surgeon: Barnett Abu, MD;  Location: [facility] OR;  Service: Neurosurgery;  Laterality: N/A;   ANTERIOR CERVICAL DECOMP/DISCECTOMY FUSION N/A 10/24/2020   Procedure: Cervical three-four, Cervical seven-Thoracic one Anterior cervical decompression/discectomy fusion;  Surgeon: Barnett Abu, MD;  Location: [facility] OR;  Service: Neurosurgery;  Laterality: N/A;   CERVICAL DISCECTOMY     OPEN REDUCTION INTERNAL FIXATION (ORIF) DISTAL RADIAL FRACTURE  Left 03/27/2022   Procedure: OPEN REDUCTION INTERNAL FIXATION (ORIF) DISTAL RADIUS FRACTURE;  Surgeon: Kennedy Bucker, MD;  Location: [facility];  Service: Orthopedics;  Laterality: Left;  Latex   ROTATOR CUFF REPAIR Right     FAMILY HISTORY: Family History  Family history unknown: Yes    SOCIAL HISTORY: Social History   Socioeconomic History   Marital status: Divorced    Spouse name: Not on file   Number of  children: Not on file   Years of education: Not on file   Highest education level: Not on file  Occupational History   Not on file  Tobacco Use   Smoking status: Every Day    Packs/day: 0.50    Years: 43.00    Additional pack years: 0.00    Total pack years: 21.50    Types: Cigarettes   Smokeless tobacco: Never   Tobacco comments:    Started smoking age 57  Vaping Use   Vaping Use: Never used  Substance and Sexual Activity   Alcohol use: Yes    Alcohol/week: 10.0 standard drinks of alcohol    Types: 10 Cans of beer per week    Comment: every other day   Drug use: No   Sexual activity: Not Currently  Other Topics Concern   Not on file  Social History Narrative   ** Merged History Encounter **       Social Determinants of Health   Financial Resource Strain: Not on file  Food Insecurity: Not on file  Transportation Needs: Not on file  Physical Activity: Not on file  Stress: Not on file  Social Connections: Not on file  Intimate Partner Violence: Not on file     PHYSICAL EXAM  GENERAL EXAM/CONSTITUTIONAL: Vitals:  Vitals:   10/22/22 1606 10/22/22 1613  BP: (!) 166/94 (!) 171/93  Pulse: 95 95  Weight: 117 lb (53.1 kg)   Height: 5\' 2"  (1.575 m)    Body mass index is 21.4 kg/m. Wt Readings from Last 3 Encounters:  10/22/22 117 lb (53.1 kg)  04/17/22 119 lb (54 kg)  03/27/22 115 lb 11.2 oz (52.5 kg)   Patient is in no distress; well developed, nourished and groomed; neck is supple  CARDIOVASCULAR: Examination of carotid arteries is normal; no carotid bruits Regular rate and rhythm, no murmurs Examination of peripheral vascular system by observation and palpation is normal  EYES: Ophthalmoscopic exam of optic discs and posterior segments is normal; no papilledema or hemorrhages No results found.  MUSCULOSKELETAL: Gait, strength, tone, movements noted in Neurologic exam below  NEUROLOGIC: MENTAL STATUS:      No data to display         awake,  alert, oriented to person, place and time recent and remote memory intact normal attention and concentration language fluent, comprehension intact, naming intact fund of knowledge appropriate  CRANIAL NERVE:  2nd - no papilledema on fundoscopic exam 2nd, 3rd, 4th, 6th - pupils equal and reactive to light, visual fields full to confrontation, extraocular muscles intact, no nystagmus 5th - facial sensation symmetric 7th - facial strength symmetric 8th - hearing intact 9th - palate elevates symmetrically, uvula midline 11th - shoulder shrug symmetric 12th - tongue protrusion midline MILD DYSARTHRIA  MOTOR:  normal bulk and tone, full strength in the BUE, BLE; EXCEPT BILATERAL DELTOID 3 AND BILATERAL DORSIFLEXION 4  SENSORY:  normal and symmetric to light touch, temperature, vibration  COORDINATION:  finger-nose-finger, fine finger movements normal  REFLEXES:  deep tendon reflexes  2+ and symmetric; 1 AT ANKLES  GAIT/STATION:  narrow based gait     DIAGNOSTIC DATA (LABS, IMAGING, TESTING) - I reviewed patient records, labs, notes, testing and imaging myself where available.  Lab Results  Component Value Date   WBC 11.2 (H) 04/17/2022   HGB 10.0 (L) 04/17/2022   HCT 28.8 (L) 04/17/2022   MCV 94.7 04/17/2022   PLT 298 04/17/2022      Component Value Date/Time   NA 129 (L) 04/17/2022 1154   NA 138 08/21/2020 1600   NA 136 10/30/2011 0829   K 3.9 04/17/2022 1154   K 3.4 (L) 10/30/2011 0829   CL 98 04/17/2022 1154   CL 103 10/30/2011 0829   CO2 21 (L) 04/17/2022 1154   CO2 24 10/30/2011 0829   GLUCOSE 111 (H) 04/17/2022 1154   GLUCOSE 93 10/30/2011 0829   BUN 14 04/17/2022 1154   BUN 12 08/21/2020 1600   BUN 8 10/30/2011 0829   CREATININE 1.07 (H) 04/17/2022 1154   CREATININE 0.52 (L) 10/30/2011 0829   CALCIUM 9.1 04/17/2022 1154   CALCIUM 9.0 10/30/2011 0829   PROT 7.4 08/21/2020 1600   PROT 7.8 10/30/2011 0829   ALBUMIN 4.6 08/21/2020 1600   ALBUMIN 3.7  10/30/2011 0829   AST 17 08/21/2020 1600   AST 23 10/30/2011 0829   ALT 11 08/21/2020 1600   ALT 16 10/30/2011 0829   ALKPHOS 91 08/21/2020 1600   ALKPHOS 58 10/30/2011 0829   BILITOT 0.2 08/21/2020 1600   BILITOT 0.5 10/30/2011 0829   GFRNONAA 59 (L) 04/17/2022 1154   GFRNONAA >60 10/30/2011 0829   GFRAA 80 05/30/2019 1555   GFRAA >60 10/30/2011 0829   Lab Results  Component Value Date   CHOL 170 08/21/2020   HDL 71 08/21/2020   LDLCALC 83 08/21/2020   TRIG 88 08/21/2020   CHOLHDL 2.4 08/21/2020   Lab Results  Component Value Date   HGBA1C 5.2 05/30/2019   Lab Results  Component Value Date   VITAMINB12 260 08/21/2020   Lab Results  Component Value Date   TSH 1.570 08/21/2020    08/10/22 swallow testing Clinical Impression: Patient presents with multifactorial oropharyngeal and pharyngoesophageal dysphagia. While underlying structural changes are felt to be contributory to pt's dysphagia (position of cervical spine, ACDF hardware which appears to narrow pharyngeal space, impede epiglottic deflection) motor and sensory impairments are noted as well.     ASSESSMENT AND PLAN  62 y.o. year old female here with:   Dx:  1. Oropharyngeal dysphagia   2. Dysarthria     PLAN:  DYSPHAGIA / DYSARTHRIA (gradual progressive x 1 year) - check CT soft tissue neck (history of smoking; rule out cancer) - check MRI brain w/wo (rule out stroke, inflamm conditions) - check neuropathy, myopathy, NMJ dz labs  Orders Placed This Encounter  Procedures   CT SOFT TISSUE NECK W WO CONTRAST   MR BRAIN W WO CONTRAST   CK   Aldolase   AChR Abs with Reflex to MuSK   Vitamin B12   Hemoglobin A1c   Return for pending if symptoms worsen or fail to improve, pending test results.    Suanne Marker, MD 10/22/2022, 4:33 PM Certified in Neurology, Neurophysiology and Neuroimaging  [facility] [address] [place], [place] [address] [phone]

## 2022-10-22 NOTE — Patient Instructions (Signed)
  DYSPHAGIA / DYSARTHRIA (gradual progressive x 1 year) - check CT soft tissue neck - check MRI brain w/wo - check neuropathy, myopathy, NMJ dz labs

## 2022-10-22 NOTE — Telephone Encounter (Signed)
sent to GI they obtain Lynn Black, Lynn Black [phone]

## 2022-10-24 ENCOUNTER — Other Ambulatory Visit (INDEPENDENT_AMBULATORY_CARE_PROVIDER_SITE_OTHER): Payer: Self-pay | Admitting: Family Medicine

## 2022-10-27 ENCOUNTER — Encounter: Payer: Self-pay | Admitting: Diagnostic Neuroimaging

## 2022-10-27 ENCOUNTER — Encounter: Payer: Medicaid Other | Admitting: Speech Pathology

## 2022-10-27 LAB — HEMOGLOBIN A1C
Est. average glucose Bld gHb Est-mCnc: 108 mg/dL
Hgb A1c MFr Bld: 5.4 % (ref 4.8–5.6)

## 2022-10-27 LAB — MUSK ANTIBODIES

## 2022-10-29 ENCOUNTER — Other Ambulatory Visit (INDEPENDENT_AMBULATORY_CARE_PROVIDER_SITE_OTHER): Payer: Self-pay | Admitting: Family Medicine

## 2022-10-29 ENCOUNTER — Encounter: Payer: Medicaid Other | Admitting: Speech Pathology

## 2022-10-29 DIAGNOSIS — I2510 Atherosclerotic heart disease of native coronary artery without angina pectoris: Secondary | ICD-10-CM

## 2022-10-29 DIAGNOSIS — E7800 Pure hypercholesterolemia, unspecified: Secondary | ICD-10-CM

## 2022-11-03 ENCOUNTER — Encounter: Payer: Medicaid Other | Admitting: Speech Pathology

## 2022-11-04 LAB — VITAMIN B12: Vitamin B-12: 291 pg/mL (ref 232–1245)

## 2022-11-04 LAB — ACHR ABS WITH REFLEX TO MUSK: AChR Binding Ab, Serum: <0.03 nmol/L (ref 0.00–0.24)

## 2022-11-04 LAB — CK: Total CK: 73 U/L (ref 32–182)

## 2022-11-04 LAB — ALDOLASE: Aldolase: 5.6 U/L (ref 3.3–10.3)

## 2022-11-05 ENCOUNTER — Telehealth: Payer: Self-pay | Admitting: Anesthesiology

## 2022-11-05 ENCOUNTER — Telehealth: Payer: Self-pay

## 2022-11-05 ENCOUNTER — Ambulatory Visit
Admission: RE | Admit: 2022-11-05 | Discharge: 2022-11-05 | Disposition: A | Discharge status: Home / Self Care | Payer: Medicaid Other | Source: Ambulatory Visit | Attending: Diagnostic Neuroimaging | Admitting: Diagnostic Neuroimaging

## 2022-11-05 ENCOUNTER — Telehealth: Payer: Self-pay | Admitting: Diagnostic Neuroimaging

## 2022-11-05 ENCOUNTER — Encounter: Payer: Medicaid Other | Admitting: Speech Pathology

## 2022-11-05 DIAGNOSIS — R1312 Dysphagia, oropharyngeal phase: Secondary | ICD-10-CM

## 2022-11-05 DIAGNOSIS — R471 Dysarthria and anarthria: Secondary | ICD-10-CM

## 2022-11-05 NOTE — Telephone Encounter (Signed)
Left message for patient to return call regarding lab results.   **please have pt fill out a DPR, was not able to find one on file**

## 2022-11-05 NOTE — Telephone Encounter (Signed)
-----   Message from Suanne Marker, MD sent at 11/05/2022  1:37 PM EDT ----- Normal labs. -VRP

## 2022-11-05 NOTE — Telephone Encounter (Signed)
Stacy @ [facility] called to report that if pt is to have the CT with contrast pt will have to be pre medicated, Kennyth Arnold can be reached at [phone] Kennyth Arnold is in the office until 4:30

## 2022-11-05 NOTE — Telephone Encounter (Signed)
I spoke with the patient and provided normal results. She verbalized understanding/appreciation for the call.

## 2022-11-05 NOTE — Telephone Encounter (Signed)
-----   Message from Vikram R Penumalli, MD sent at 11/05/2022  1:37 PM EDT ----- Normal labs. -VRP 

## 2022-11-06 ENCOUNTER — Other Ambulatory Visit (INDEPENDENT_AMBULATORY_CARE_PROVIDER_SITE_OTHER): Payer: Self-pay | Admitting: Family Medicine

## 2022-11-06 MED ORDER — SEMAGLUTIDE-WEIGHT MANAGEMENT 1 MG/0.5ML SC SOAJ
1.0000 mg | SUBCUTANEOUS | 0 refills | Status: DC
Start: 2022-11-06 — End: 2022-11-09

## 2022-11-07 ENCOUNTER — Encounter (INDEPENDENT_AMBULATORY_CARE_PROVIDER_SITE_OTHER): Payer: Self-pay | Admitting: Family Medicine

## 2022-11-09 ENCOUNTER — Other Ambulatory Visit (INDEPENDENT_AMBULATORY_CARE_PROVIDER_SITE_OTHER): Payer: Self-pay | Admitting: Family Medicine

## 2022-11-09 DIAGNOSIS — Z6829 Body mass index (BMI) 29.0-29.9, adult: Secondary | ICD-10-CM

## 2022-11-09 MED ORDER — SEMAGLUTIDE-WEIGHT MANAGEMENT 1.7 MG/0.75ML SC SOAJ
1.7000 mg | SUBCUTANEOUS | 0 refills | Status: DC
Start: 2022-11-09 — End: 2022-11-29

## 2022-11-10 NOTE — Telephone Encounter (Signed)
Spoke with Sharl Ma at [facility], she stated pt will be premedicated for her CT scan appointment on 11/16/2022, states pt will be getting a call from the nurse to inform and explain about the 13 hr prep and the prednisone/Benadryl she will be getting prior to appointment.

## 2022-11-11 ENCOUNTER — Telehealth: Payer: Self-pay

## 2022-11-11 MED ORDER — PREDNISONE 50 MG PO TABS: ORAL_TABLET | ORAL | 0 refills | Status: DC

## 2022-11-11 MED ORDER — DIPHENHYDRAMINE HCL 50 MG PO TABS: ORAL_TABLET | ORAL | 0 refills | Status: AC

## 2022-11-11 NOTE — Telephone Encounter (Signed)
Phone call to patient to review instructions for 13 hr prep for CT w/ contrast on 11/16/22 at 4:00pm. Prescription called into [facility]. Pt aware and verbalized understanding of instructions. Prescription:Pt to take 50 mg of prednisone on 11/16/22 at 3:00 am, 50 mg of prednisone on 11/16/22 at 9:00 am, and 50 mg of prednisone on 11/16/22 at 3:00 pm. Pt is also to take 50 mg of benadryl on 11/16/22 at 3:00 pm. Please call [phone] with any questions.

## 2022-11-16 ENCOUNTER — Ambulatory Visit
Admission: RE | Admit: 2022-11-16 | Discharge: 2022-11-16 | Disposition: A | Discharge status: Home / Self Care | Payer: Medicaid Other | Source: Ambulatory Visit | Attending: Diagnostic Neuroimaging | Admitting: Diagnostic Neuroimaging

## 2022-11-16 DIAGNOSIS — R471 Dysarthria and anarthria: Secondary | ICD-10-CM

## 2022-11-16 DIAGNOSIS — R1312 Dysphagia, oropharyngeal phase: Secondary | ICD-10-CM

## 2022-11-16 MED ORDER — DIPHENHYDRAMINE HCL 50 MG PO CAPS: 50.0000 mg | ORAL_CAPSULE | Freq: Once | ORAL | Status: DC

## 2022-11-16 MED ORDER — DIPHENHYDRAMINE HCL 50 MG/ML IJ SOLN: 50.0000 mg | Freq: Once | INTRAMUSCULAR | Status: DC

## 2022-11-16 MED ORDER — IOPAMIDOL (ISOVUE-300) INJECTION 61%
75.0000 mL | Freq: Once | INTRAVENOUS | Status: AC | PRN
  Administered 2022-11-16: 75 mL via INTRAVENOUS

## 2022-11-16 MED ORDER — PREDNISONE 50 MG PO TABS: 50.0000 mg | ORAL_TABLET | Freq: Four times a day (QID) | ORAL | Status: DC

## 2022-11-18 ENCOUNTER — Encounter: Payer: No Typology Code available for payment source | Admitting: Speech Pathology

## 2022-11-29 ENCOUNTER — Other Ambulatory Visit (INDEPENDENT_AMBULATORY_CARE_PROVIDER_SITE_OTHER): Payer: Self-pay | Admitting: Family Medicine

## 2022-11-29 DIAGNOSIS — Z6829 Body mass index (BMI) 29.0-29.9, adult: Secondary | ICD-10-CM

## 2022-11-30 ENCOUNTER — Telehealth: Payer: Self-pay

## 2022-11-30 NOTE — Telephone Encounter (Signed)
-----   Message from Suanne Marker, MD sent at 11/29/2022  3:28 PM EDT ----- Scan looks good. No abnormality to explain swallowing / speech issues. Some atherosclerosis of carotid arteries. Follow up with PCP for blood pressure, lipid and diabetes screening and treatments.

## 2022-11-30 NOTE — Telephone Encounter (Signed)
Contacted pt, informed her Scan looks good. No abnormality to explain swallowing / speech issues. Some atherosclerosis, build up of fats/ cholesterol, of carotid arteries. Advised to follow up with PCP for blood pressure, lipid and diabetes screening and treatments.  Advised to call the office back with any questions or concerns as she had none at this time and was appreciative.

## 2022-12-02 ENCOUNTER — Encounter: Payer: No Typology Code available for payment source | Admitting: Speech Pathology

## 2022-12-05 ENCOUNTER — Other Ambulatory Visit (INDEPENDENT_AMBULATORY_CARE_PROVIDER_SITE_OTHER): Payer: Self-pay | Admitting: Family Medicine

## 2022-12-05 DIAGNOSIS — Z6829 Body mass index (BMI) 29.0-29.9, adult: Secondary | ICD-10-CM

## 2022-12-09 ENCOUNTER — Encounter: Payer: No Typology Code available for payment source | Admitting: Speech Pathology

## 2022-12-16 ENCOUNTER — Encounter: Payer: No Typology Code available for payment source | Admitting: Speech Pathology

## 2022-12-18 ENCOUNTER — Other Ambulatory Visit (INDEPENDENT_AMBULATORY_CARE_PROVIDER_SITE_OTHER): Payer: Self-pay | Admitting: Family Medicine

## 2022-12-18 DIAGNOSIS — G4700 Insomnia, unspecified: Secondary | ICD-10-CM

## 2022-12-23 ENCOUNTER — Encounter: Payer: No Typology Code available for payment source | Admitting: Speech Pathology

## 2022-12-30 ENCOUNTER — Encounter: Payer: No Typology Code available for payment source | Admitting: Speech Pathology

## 2023-01-06 ENCOUNTER — Encounter: Payer: No Typology Code available for payment source | Admitting: Speech Pathology

## 2023-01-11 ENCOUNTER — Other Ambulatory Visit (INDEPENDENT_AMBULATORY_CARE_PROVIDER_SITE_OTHER): Payer: Self-pay | Admitting: Family Medicine

## 2023-01-11 DIAGNOSIS — Z6829 Body mass index (BMI) 29.0-29.9, adult: Secondary | ICD-10-CM

## 2023-01-11 MED ORDER — SEMAGLUTIDE-WEIGHT MANAGEMENT 2.4 MG/0.75ML SC SOAJ
2.4000 mg | SUBCUTANEOUS | 1 refills | Status: DC
Start: 2023-01-11 — End: 2023-03-09

## 2023-01-13 ENCOUNTER — Encounter: Payer: No Typology Code available for payment source | Admitting: Speech Pathology

## 2023-01-20 ENCOUNTER — Encounter: Payer: No Typology Code available for payment source | Admitting: Speech Pathology

## 2023-02-26 ENCOUNTER — Telehealth (INDEPENDENT_AMBULATORY_CARE_PROVIDER_SITE_OTHER): Payer: Self-pay

## 2023-02-26 NOTE — Telephone Encounter (Signed)
Wegovy 2.4MG /0.75ML auto-injectors  Key: [id number]  Prior authorization submitted using Covermymeds  APPROVED  01/27/2023 through 02/26/2024    Patient was notified using MyChartMsg    Prior Authorization Questions:  1.What is the patient's age in years? 25    2.What is the patient's diagnosis? Obesity, used for chronic weight management    3.Will the patient use this medication in combination with lifestyle changes and a reduced calorie diet? YES    4.Has the patient been on this medication continuously for the last 4 months excluding samples? YES    5.Has the patient had a clinically significant improvement in weight and/or has maintained their weight loss while on this medication? YES    6.Is the patient currently using another glucagon-like peptide-1 (GLP-1) receptor agonist? No, not using another GLP-1.    7.Will this medication be used in combination with another Prior Authorization (PA) medication for weight loss? No    8.Will the patient need more than 12 single-dose pens every 84 days? No

## 2023-03-06 ENCOUNTER — Other Ambulatory Visit (INDEPENDENT_AMBULATORY_CARE_PROVIDER_SITE_OTHER): Payer: Self-pay | Admitting: Family Medicine

## 2023-03-06 DIAGNOSIS — Z6829 Body mass index (BMI) 29.0-29.9, adult: Secondary | ICD-10-CM

## 2023-03-09 NOTE — Telephone Encounter (Signed)
Dis 3, 1 refill pending for Wegovy 2.4mg /0.41ml injection        Last OV - Patient was last seen on 10/08/2022 patient does have a future appointment on 03/10/2023        Dr. Christy Sartorius - Please review. Thank you!

## 2023-03-10 ENCOUNTER — Ambulatory Visit (INDEPENDENT_AMBULATORY_CARE_PROVIDER_SITE_OTHER): Payer: BLUE CROSS/BLUE SHIELD | Admitting: Family Medicine

## 2023-03-10 ENCOUNTER — Encounter (INDEPENDENT_AMBULATORY_CARE_PROVIDER_SITE_OTHER): Payer: Self-pay | Admitting: Family Medicine

## 2023-03-10 VITALS — BP 112/74 | HR 82 | Temp 97.7°F | Wt 184.0 lb

## 2023-03-10 DIAGNOSIS — I2510 Atherosclerotic heart disease of native coronary artery without angina pectoris: Secondary | ICD-10-CM

## 2023-03-10 DIAGNOSIS — G2581 Restless legs syndrome: Secondary | ICD-10-CM

## 2023-03-10 DIAGNOSIS — Z5181 Encounter for therapeutic drug level monitoring: Secondary | ICD-10-CM

## 2023-03-10 MED ORDER — DIAZEPAM 5 MG PO TABS
5.0000 mg | ORAL_TABLET | Freq: Every evening | ORAL | 5 refills | Status: AC | PRN
Start: 2023-03-10 — End: ?

## 2023-03-10 NOTE — Progress Notes (Signed)
Subjective:      Patient ID: Melinda Brown is a 62 y.o. female     Chief Complaint   Patient presents with    Follow-up    Hyperlipidemia    Insomnia        HPI     62 yo female with need for fasting laboratories since increasing rosuvastatin from 5 to 10 mg approximately 5 months ago--along with the increase she is experienced myalgias in the lower extremities and wonders whether it is due to her restless leg syndrome (request refill diazepam) or the increase in rosuvastatin...GYN: [name]. Prescription medication: Zolpidem CR 12.5 mg as needed, Weygovy 2.4 mg qw, Crestor 10 mg qd. no known drug allergy. No tobacco/daily alcohol. 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 Past surgical history: OU cataract, Appendectomy, (benign) breast lumpectomy, (multiple) cosmetic . Cardiac risk factor: Female greater than 55, hyperlipidemia, ASCVD ((+) CAC 11/23) [name]). family history: atrial fibrillation/non-prem CAD (father), atrial fibrillation/ILD (mother), No colon cancer. Tdap 09/24/2016, Shingrix (02/16/2019, 04/29/2019), Pfizer (09/04/2019, 09/27/19, 04/09/20, bivalent 04/29/21), influenza 04/29/21.Mammography 2021 benign      Review of Systems see HPI, otherwise negative/noncontributory      BP 112/74 (BP Site: Left arm, Patient Position: Sitting, Cuff Size: Large)   Pulse 82   Temp 97.7 F (36.5 C)   Wt 83.5 kg (184 lb)   LMP  (LMP Unknown)   SpO2 99%   BMI 29.70 kg/m     Objective:     Physical Exam  Vitals and nursing note reviewed.   Constitutional:       Appearance: Normal appearance.   HENT:      Head: Normocephalic and atraumatic.      Right Ear: External ear normal.      Left Ear: External ear normal.      Mouth/Throat:      Pharynx: Oropharynx is clear.   Eyes:      Extraocular Movements: Extraocular movements intact.      Conjunctiva/sclera:  Conjunctivae normal.      Pupils: Pupils are equal, round, and reactive to light.   Neck:      Vascular: No carotid bruit.   Cardiovascular:      Rate and Rhythm: Normal rate and regular rhythm.      Pulses: Normal pulses.      Heart sounds: Normal heart sounds.   Pulmonary:      Effort: Pulmonary effort is normal.      Breath sounds: Normal breath sounds.   Abdominal:      General: Abdomen is flat.      Palpations: Abdomen is soft.      Comments: Waist circumference 36 inches   Musculoskeletal:         General: Normal range of motion.      Cervical back: Normal range of motion and neck supple.      Right lower leg: No edema.      Left lower leg: No edema.   Lymphadenopathy:      Cervical: No cervical adenopathy.   Skin:     General: Skin is warm and dry.   Neurological:      General: No focal deficit present.      Mental Status: She is alert and oriented to person, place, and time. Mental status is at baseline.   Psychiatric:         Mood and  Affect: Mood normal.         Behavior: Behavior normal.         Thought Content: Thought content normal.         Judgment: Judgment normal.        Assessment:     1. RLS (restless legs syndrome)  - diazePAM (VALIUM) 5 MG tablet; Take 1 tablet (5 mg) by mouth nightly as needed (RLS)  Dispense: 30 tablet; Refill: 5    2. ASCVD (arteriosclerotic cardiovascular disease)  - Lipid Panel; Future    3. Medication monitoring encounter  - Comprehensive Metabolic Panel; Future        Plan:     See assessment/orders. Discussed addition of co-Q10    Theresa Mulligan, MD

## 2023-03-12 ENCOUNTER — Other Ambulatory Visit (FREE_STANDING_LABORATORY_FACILITY): Payer: BLUE CROSS/BLUE SHIELD

## 2023-03-12 DIAGNOSIS — Z5181 Encounter for therapeutic drug level monitoring: Secondary | ICD-10-CM

## 2023-03-12 DIAGNOSIS — I2510 Atherosclerotic heart disease of native coronary artery without angina pectoris: Secondary | ICD-10-CM

## 2023-03-12 LAB — COMPREHENSIVE METABOLIC PANEL
ALT: 46 U/L (ref 0–55)
AST (SGOT): 33 U/L (ref 5–41)
Albumin/Globulin Ratio: 1.4 (ref 0.9–2.2)
Albumin: 4.4 g/dL (ref 3.5–5.0)
Alkaline Phosphatase: 93 U/L (ref 37–117)
Anion Gap: 9 (ref 5.0–15.0)
BUN: 16 mg/dL (ref 7–21)
Bilirubin, Total: 0.6 mg/dL (ref 0.2–1.2)
CO2: 24 meq/L (ref 17–29)
Calcium: 10.2 mg/dL (ref 8.5–10.5)
Chloride: 106 meq/L (ref 99–111)
Creatinine: 0.8 mg/dL (ref 0.4–1.0)
GFR: >60 mL/min/{1.73_m2} (ref >=60.0)
Globulin: 3.2 g/dL (ref 2.0–3.6)
Glucose: 90 mg/dL (ref 70–100)
Hemolysis Index: 8 {index}
Potassium: 4.9 meq/L (ref 3.5–5.3)
Protein, Total: 7.6 g/dL (ref 6.0–8.3)
Sodium: 139 meq/L (ref 135–145)

## 2023-03-12 LAB — LIPID PANEL
Cholesterol / HDL Ratio: 2.9 {index}
Cholesterol: 190 mg/dL (ref <=199)
HDL: 66 mg/dL (ref >=40)
LDL Calculated: 92 mg/dL (ref 0–99)
Triglycerides: 162 mg/dL — ABNORMAL HIGH (ref 34–149)
VLDL Calculated: 32 mg/dL (ref 10–40)

## 2023-03-13 ENCOUNTER — Other Ambulatory Visit: Payer: Self-pay

## 2023-03-13 ENCOUNTER — Emergency Department
Admission: EM | Admit: 2023-03-13 | Discharge: 2023-03-13 | Disposition: A | Discharge status: Home / Self Care | Payer: Medicaid Other | Attending: Emergency Medicine | Admitting: Emergency Medicine

## 2023-03-13 DIAGNOSIS — R1314 Dysphagia, pharyngoesophageal phase: Secondary | ICD-10-CM | POA: Insufficient documentation

## 2023-03-13 DIAGNOSIS — R1310 Dysphagia, unspecified: Secondary | ICD-10-CM | POA: Diagnosis present

## 2023-03-13 DIAGNOSIS — R030 Elevated blood-pressure reading, without diagnosis of hypertension: Secondary | ICD-10-CM | POA: Insufficient documentation

## 2023-03-13 NOTE — Discharge Instructions (Addendum)
Follow-up with your primary provider and specialist as discussed.

## 2023-03-13 NOTE — ED Triage Notes (Signed)
Pt to ED for burning to throat and problems swallowing since several months. States was told her vocal cords are "bowed". States voice is hoarse on and off. States sometimes food gets stuck in throat when swallowing. Systolic BP 225, took twice. Denies hx HTN. Denies HA, vision problems.

## 2023-03-13 NOTE — ED Notes (Signed)
Patient c/o increased burning to her throat. Patient states that she has a history of difficulty swallowing, but the last few days patient states that she has not been able to eat.

## 2023-03-16 NOTE — ED Provider Notes (Signed)
[facility] Emergency Department Provider Note     Event Date/Time   First MD Initiated Contact with Patient 03/13/23 1312     (approximate)   History   swallowing problem   HPI  Lynn Black is a 62 y.o. female with a noncontributory medical history, presents to the ED for evaluation of burning to the throat and some painful swallowing for several months.  Patient poor to force voice, does endorse prior evaluation by both ENT, GI, speech pathology, and neurology.  She notes that according to upper endoscopy, she has "bowed" vocal cords.  She has been evaluated and discharged by speech-language path, and apparently has been discharged from [facility] service secondary to missed appointments.  She is awaiting a referral to an secondary ENT specialist by [facility].  Patient denies any cough, congestion, recent choking.  I is difficult to ascertain what intervention she believes the ED can provide considering she has been evaluated by all of the appropriate specialist in this particular presentation.  Physical Exam   Triage Vital Signs: ED Triage Vitals  Encounter Vitals Group     BP 03/13/23 1202 (!) 225/90     Systolic BP Percentile --      Diastolic BP Percentile --      Pulse Rate 03/13/23 1202 (!) 109     Resp 03/13/23 1202 17     Temp 03/13/23 1202 98.3 F (36.8 C)     Temp Source 03/13/23 1202 Oral     SpO2 03/13/23 1221 98 %     Weight 03/13/23 1203 110 lb (49.9 kg)     Height 03/13/23 1203 5\' 2"  (1.575 m)     Head Circumference --      Peak Flow --      Pain Score 03/13/23 1200 6     Pain Loc --      Pain Education --      Exclude from Growth Chart --     Most recent vital signs: Vitals:   03/13/23 1329 03/13/23 1407  BP: (!) 175/88   Pulse: 99 95  Resp: 16 16  Temp:    SpO2: 100% 98%    General Awake, no distress. NAD HEENT NCAT. PERRL. EOMI. No rhinorrhea. Mucous membranes are moist.  Uvula is midline tonsils are flat.   Oropharyngeal lesions appreciated.  Normal voice. CV:  Good peripheral perfusion.  RESP:  Normal effort.  ABD:  No distention.    ED Results / Procedures / Treatments   Labs (all labs ordered are listed, but only abnormal results are displayed) Labs Reviewed - No data to display   EKG   RADIOLOGY  No results found.   PROCEDURES:  Critical Care performed: No  Procedures   MEDICATIONS ORDERED IN ED: Medications - No data to display   IMPRESSION / MDM / ASSESSMENT AND PLAN / ED COURSE  I reviewed the triage vital signs and the nursing notes.                              Differential diagnosis includes, but is not limited to, reflux, postnasal drip, pill esophagitis, food bolus, chronic laryngitis  Patient's presentation is most consistent with acute, uncomplicated illness.  Patient's diagnosis is consistent with for esophageal dysphagia and elevated BP without diagnosis of HTN.  Patient with reassuring exam at this time without evidence of acute airway compromise, angioedema, or food bolus.  Patient will be discharged home with tractions take her home medications as prescribed, and monitor and track her blood pressure readings. Patient is to follow up with PCP and specialist as discussed, as needed or otherwise directed. Patient is given ED precautions to return to the ED for any worsening or new symptoms.     FINAL CLINICAL IMPRESSION(S) / ED DIAGNOSES   Final diagnoses:  Pharyngoesophageal dysphagia  Elevated BP without diagnosis of hypertension     Rx / DC Orders   ED Discharge Orders     None        Note:  This document was prepared using Dragon voice recognition software and may include unintentional dictation errors.    Lissa Hoard, PA-C 03/16/23 0112    Janith Lima, MD 03/17/23 [phone]

## 2023-03-31 ENCOUNTER — Ambulatory Visit
Admission: EM | Admit: 2023-03-31 | Discharge: 2023-03-31 | Disposition: A | Discharge status: Home / Self Care | Payer: Medicaid Other

## 2023-03-31 DIAGNOSIS — R030 Elevated blood-pressure reading, without diagnosis of hypertension: Secondary | ICD-10-CM

## 2023-03-31 DIAGNOSIS — I10 Essential (primary) hypertension: Secondary | ICD-10-CM

## 2023-03-31 DIAGNOSIS — M436 Torticollis: Secondary | ICD-10-CM | POA: Diagnosis not present

## 2023-03-31 MED ORDER — IBUPROFEN 600 MG PO TABS: 600.0000 mg | ORAL_TABLET | Freq: Four times a day (QID) | ORAL | 0 refills | Status: AC | PRN

## 2023-03-31 MED ORDER — METHOCARBAMOL 500 MG PO TABS: 500.0000 mg | ORAL_TABLET | Freq: Two times a day (BID) | ORAL | 0 refills | Status: DC | PRN

## 2023-03-31 NOTE — Discharge Instructions (Addendum)
Take the ibuprofen as directed.  Take the muscle relaxer as needed for muscle spasm; Do not drive, operate machinery, or drink alcohol with this medication as it can cause drowsiness.  Follow up with your primary care provider if your symptoms are not improving.    Your blood pressure is elevated today at 154/88.  Please have this rechecked by your primary care provider in 2-4 weeks.

## 2023-03-31 NOTE — ED Triage Notes (Signed)
Patient to Urgent Care with complaints of neck pain and stiffness. Hx of the same- diagnosed with a pinched nerve.  Symptoms started on Monday. Denies any injury. Reports she was fixing lunch when symptoms started.

## 2023-03-31 NOTE — ED Provider Notes (Signed)
[facility] Cower    CSN: [id number] Arrival date & time: 03/31/23  1540      History   Chief Complaint Chief Complaint  Patient presents with   Torticollis    HPI Lynn Black is a 62 y.o. female.  Patient presents with left side neck pain and stiffness x 2 days.  No falls or injury.  The pain is nonradiating.  It is worse with movement.  No numbness, weakness, paresthesias.  No OTC medications taken.  She denies fever, chills, sore throat, cough, shortness of breath, chest pain, or other symptoms.  Patient was seen at [facility] ED on 03/13/2023; diagnosed with pharyngoesophageal dysphagia, elevated BP.  The history is provided by the patient and medical records.    Past Medical History:  Diagnosis Date   Eczema    GERD (gastroesophageal reflux disease)    Heart murmur    " nothing to wory about"   Pruritus    Psoriasis    Seasonal allergies    Smoking    Vitamin B12 deficiency 05/2019   Vitamin D deficiency 05/2019   Wears dentures    partial upper and lower    Patient Active Problem List   Diagnosis Date Noted   Cervical spondylosis with myelopathy and radiculopathy 10/24/2020   Hypertension 09/12/2018   Tobacco dependence 10/15/2017   Pruritus 10/15/2017   Neuropathic pain 10/15/2017   Psoriasis 03/16/2017   Cervical arthritis with myelopathy 10/26/2016    Past Surgical History:  Procedure Laterality Date   ANTERIOR CERVICAL DECOMP/DISCECTOMY FUSION N/A 10/26/2016   Procedure: Cervical four-five, Cervical five-six  Anterior cervical decompression/discectomy/fusion;  Surgeon: Barnett Abu, MD;  Location: [facility] OR;  Service: Neurosurgery;  Laterality: N/A;   ANTERIOR CERVICAL DECOMP/DISCECTOMY FUSION N/A 10/24/2020   Procedure: Cervical three-four, Cervical seven-Thoracic one Anterior cervical decompression/discectomy fusion;  Surgeon: Barnett Abu, MD;  Location: [facility] OR;  Service: Neurosurgery;  Laterality: N/A;   CERVICAL DISCECTOMY     OPEN REDUCTION  INTERNAL FIXATION (ORIF) DISTAL RADIAL FRACTURE Left 03/27/2022   Procedure: OPEN REDUCTION INTERNAL FIXATION (ORIF) DISTAL RADIUS FRACTURE;  Surgeon: Kennedy Bucker, MD;  Location: [facility];  Service: Orthopedics;  Laterality: Left;  Latex   ROTATOR CUFF REPAIR Right     OB History     Gravida  1   Para  0   Term  0   Preterm  0   AB  0   Living         SAB  0   IAB  0   Ectopic  0   Multiple      Live Births               Home Medications    Prior to Admission medications   Medication Sig Start Date End Date Taking? Authorizing Provider  atorvastatin (LIPITOR) 20 MG tablet Take 20 mg by mouth daily. 12/01/22  Yes [provider]  DUPIXENT 300 MG/2ML SOAJ Inject into the skin. 03/09/23  Yes [provider]  ibuprofen (ADVIL) 600 MG tablet Take 1 tablet (600 mg total) by mouth every 6 (six) hours as needed. 03/31/23  Yes Mickie Bail, NP  methocarbamol (ROBAXIN) 500 MG tablet Take 1 tablet (500 mg total) by mouth 2 (two) times daily as needed for muscle spasms. 03/31/23  Yes Mickie Bail, NP  diphenhydrAMINE (BENADRYL) 50 MG tablet Pt to take 50 mg of prednisone on 11/16/22 at 3:00 am, 50 mg of prednisone on 11/16/22 at 9:00 am,  and 50 mg of prednisone on 11/16/22 at 3:00 pm. Pt is also to take 50 mg of benadryl on 11/16/22 at 3:00 pm. Please call [phone] with any questions. 11/11/22   Sterling Big, MD  predniSONE (DELTASONE) 50 MG tablet Pt to take 50 mg of prednisone on 11/16/22 at 3:00 am, 50 mg of prednisone on 11/16/22 at 9:00 am, and 50 mg of prednisone on 11/16/22 at 3:00 pm. Pt is also to take 50 mg of benadryl on 11/16/22 at 3:00 pm. Please call [phone] with any questions. 11/11/22   Sterling Big, MD    Family History Family History  Family history unknown: Yes    Social History Social History   Tobacco Use   Smoking status: Every Day    Current packs/day: 0.50    Average packs/day: 0.5 packs/day for  43.0 years (21.5 ttl pk-yrs)    Types: Cigarettes   Smokeless tobacco: Never   Tobacco comments:    Started smoking age 9  Vaping Use   Vaping status: Never Used  Substance Use Topics   Alcohol use: Yes    Alcohol/week: 10.0 standard drinks of alcohol    Types: 10 Cans of beer per week    Comment: every other day   Drug use: No     Allergies   Nickel, Shellfish allergy, Sulfa antibiotics, Other, Contrast media [iodinated contrast media], Onion, Tomato, Latex, Mixed feathers, and Neosporin [neomycin-bacitracin zn-polymyx]   Review of Systems Review of Systems  Constitutional:  Negative for chills and fever.  HENT:  Negative for sore throat and trouble swallowing.   Respiratory:  Negative for cough and shortness of breath.   Cardiovascular:  Negative for chest pain and palpitations.  Musculoskeletal:  Positive for neck pain and neck stiffness. Negative for joint swelling.  Skin:  Negative for color change, rash and wound.  Neurological:  Negative for weakness and numbness.     Physical Exam Triage Vital Signs ED Triage Vitals  Encounter Vitals Group     BP 03/31/23 1622 (!) 154/88     Systolic BP Percentile --      Diastolic BP Percentile --      Pulse Rate 03/31/23 1617 (!) 105     Resp 03/31/23 1617 18     Temp 03/31/23 1617 98.2 F (36.8 C)     Temp src --      SpO2 03/31/23 1617 98 %     Weight --      Height --      Head Circumference --      Peak Flow --      Pain Score 03/31/23 1619 10     Pain Loc --      Pain Education --      Exclude from Growth Chart --    No data found.  Updated Vital Signs BP (!) 154/88   Pulse (!) 105   Temp 98.2 F (36.8 C)   Resp 18   SpO2 98%   Visual Acuity Right Eye Distance:   Left Eye Distance:   Bilateral Distance:    Right Eye Near:   Left Eye Near:    Bilateral Near:     Physical Exam Constitutional:      General: She is not in acute distress. HENT:     Mouth/Throat:     Mouth: Mucous membranes are  moist.  Cardiovascular:     Rate and Rhythm: Normal rate and regular rhythm.     Heart sounds: Normal  heart sounds.  Pulmonary:     Effort: Pulmonary effort is normal. No respiratory distress.     Breath sounds: Normal breath sounds.  Musculoskeletal:        General: Tenderness present. No swelling or deformity.     Comments: Left lateral neck muscular tenderness.  Limited ROM due to discomfort.  Skin:    General: Skin is warm and dry.     Findings: No bruising, erythema, lesion or rash.  Neurological:     Mental Status: She is alert.     Sensory: No sensory deficit.     Motor: No weakness.      UC Treatments / Results  Labs (all labs ordered are listed, but only abnormal results are displayed) Labs Reviewed - No data to display  EKG   Radiology No results found.  Procedures Procedures (including critical care time)  Medications Ordered in UC Medications - No data to display  Initial Impression / Assessment and Plan / UC Course  I have reviewed the triage vital signs and the nursing notes.  Pertinent labs & imaging results that were available during my care of the patient were reviewed by me and considered in my medical decision making (see chart for details).    Torticollis, Elevated blood pressure reading with hypertension.  No trauma.  Patient declines x-ray.  Treating torticollis with ibuprofen and methocarbamol.  Precautions for drowsiness with methocarbamol discussed.  Education provided on torticollis.  Instructed patient to follow-up with her PCP if she is not improving.  ED precautions given.  Also discussed with patient that her blood pressure is elevated today and needs to be rechecked by PCP in 2 to 4 weeks.  Education provided on managing hypertension.  She agrees to plan of care.   Final Clinical Impressions(s) / UC Diagnoses   Final diagnoses:  Torticollis  Elevated blood pressure reading in office with diagnosis of hypertension     Discharge  Instructions      Take the ibuprofen as directed.  Take the muscle relaxer as needed for muscle spasm; Do not drive, operate machinery, or drink alcohol with this medication as it can cause drowsiness.  Follow up with your primary care provider if your symptoms are not improving.    Your blood pressure is elevated today at 154/88.  Please have this rechecked by your primary care provider in 2-4 weeks.          ED Prescriptions     Medication Sig Dispense Auth. Provider   ibuprofen (ADVIL) 600 MG tablet Take 1 tablet (600 mg total) by mouth every 6 (six) hours as needed. 20 tablet Mickie Bail, NP   methocarbamol (ROBAXIN) 500 MG tablet Take 1 tablet (500 mg total) by mouth 2 (two) times daily as needed for muscle spasms. 10 tablet Mickie Bail, NP      PDMP not reviewed this encounter.   Mickie Bail, NP 03/31/23 [phone]

## 2023-04-01 ENCOUNTER — Telehealth: Payer: Self-pay | Admitting: Emergency Medicine

## 2023-04-01 MED ORDER — METAXALONE 800 MG PO TABS: 800.0000 mg | ORAL_TABLET | Freq: Three times a day (TID) | ORAL | 0 refills | Status: AC

## 2023-04-01 NOTE — Telephone Encounter (Signed)
Patient reports one episode of emesis yesterday evening after taking one dose of methocarbamol.  Skelaxin prescription sent to pharmacy today.  Work note provided per patient request.  Instructed her to follow up with her primary care provider tomorrow and to go to the emergency department if she has worsening symptoms.

## 2023-04-08 ENCOUNTER — Other Ambulatory Visit (INDEPENDENT_AMBULATORY_CARE_PROVIDER_SITE_OTHER): Payer: Self-pay | Admitting: Family Medicine

## 2023-04-08 DIAGNOSIS — G4700 Insomnia, unspecified: Secondary | ICD-10-CM

## 2023-04-20 ENCOUNTER — Ambulatory Visit (INDEPENDENT_AMBULATORY_CARE_PROVIDER_SITE_OTHER): Payer: BLUE CROSS/BLUE SHIELD | Admitting: Student

## 2023-04-20 VITALS — BP 126/78 | HR 68 | Temp 98.8°F | Resp 18 | Wt 181.4 lb

## 2023-04-20 DIAGNOSIS — J189 Pneumonia, unspecified organism: Secondary | ICD-10-CM

## 2023-04-20 MED ORDER — AMOXICILLIN-POT CLAVULANATE ER 1000-62.5 MG PO TB12
2.0000 | ORAL_TABLET | Freq: Two times a day (BID) | ORAL | 0 refills | Status: AC
Start: 2023-04-20 — End: 2023-04-27

## 2023-04-20 MED ORDER — AZITHROMYCIN 250 MG PO TABS
ORAL_TABLET | ORAL | 0 refills | Status: AC
Start: 2023-04-20 — End: 2023-04-25

## 2023-04-20 NOTE — Progress Notes (Signed)
 [facility] [place] [employer] [facility]                       Date of Exam: 04/20/2023 12:32 PM        Patient ID: Darlynn Ricco is a 62 y.o. female.  Attending Physician: Posey Boyer, MD        Chief Complaint:    Chief Complaint

## 2023-04-22 ENCOUNTER — Other Ambulatory Visit: Payer: BLUE CROSS/BLUE SHIELD

## 2023-04-22 ENCOUNTER — Other Ambulatory Visit: Payer: Self-pay | Admitting: Student

## 2023-04-22 DIAGNOSIS — J189 Pneumonia, unspecified organism: Secondary | ICD-10-CM

## 2023-04-22 LAB — LAB USE ONLY - CBC WITH DIFFERENTIAL
Absolute Basophils: 0.03 10*3/uL (ref 0.00–0.08)
Absolute Eosinophils: 0.17 10*3/uL (ref 0.00–0.44)
Absolute Immature Granulocytes: 0.02 10*3/uL (ref 0.00–0.07)
Absolute Lymphocytes: 0.89 10*3/uL (ref 0.42–3.22)
Absolute Monocytes: 0.44 10*3/uL (ref 0.21–0.85)
Absolute Neutrophils: 2.99 10*3/uL (ref 1.10–6.33)
Absolute nRBC: 0 10*3/uL (ref <=0.00)
Basophils %: 0.7 %
Eosinophils %: 3.7 %
Hematocrit: 40.4 % (ref 34.7–43.7)
Hemoglobin: 14.1 g/dL (ref 11.4–14.8)
Immature Granulocytes %: 0.4 %
Lymphocytes %: 19.6 %
MCH: 30.5 pg (ref 25.1–33.5)
MCHC: 34.9 g/dL (ref 31.5–35.8)
MCV: 87.4 fL (ref 78.0–96.0)
MPV: 11.5 fL (ref 8.9–12.5)
Monocytes %: 9.7 %
Neutrophils %: 65.9 %
Platelet Count: 182 10*3/uL (ref 142–346)
Preliminary Absolute Neutrophil Count: 2.99 10*3/uL (ref 1.10–6.33)
RBC: 4.62 10*6/uL (ref 3.90–5.10)
RDW: 12 % (ref 11–15)
WBC: 4.54 10*3/uL (ref 3.10–9.50)
nRBC %: 0 /100{WBCs} (ref <=0.0)

## 2023-04-23 ENCOUNTER — Encounter (INDEPENDENT_AMBULATORY_CARE_PROVIDER_SITE_OTHER): Payer: Self-pay | Admitting: Student

## 2023-04-23 LAB — URINE LEGIONELLA PNEUMOPHILA SEROGROUP 1 ANTIGEN: Urine Legionella pneumophila serogroup 1 Antigen: NEGATIVE

## 2023-04-26 LAB — MYCOPLASMA PNEUMONIAE ANTIBODIES, IGG/IGM
Mycoplasma pneumoniae Antibody, IgG: 4.08 — ABNORMAL HIGH (ref <=0.90)
Mycoplasma pneumoniae Antibody, IgM: 979 U/mL — ABNORMAL HIGH (ref <770)

## 2023-05-09 ENCOUNTER — Other Ambulatory Visit (INDEPENDENT_AMBULATORY_CARE_PROVIDER_SITE_OTHER): Payer: Self-pay | Admitting: Family Medicine

## 2023-05-09 DIAGNOSIS — Z6829 Body mass index (BMI) 29.0-29.9, adult: Secondary | ICD-10-CM

## 2023-05-20 ENCOUNTER — Ambulatory Visit (INDEPENDENT_AMBULATORY_CARE_PROVIDER_SITE_OTHER): Payer: BLUE CROSS/BLUE SHIELD | Admitting: Student

## 2023-05-20 ENCOUNTER — Encounter (INDEPENDENT_AMBULATORY_CARE_PROVIDER_SITE_OTHER): Payer: Self-pay | Admitting: Student

## 2023-05-20 VITALS — BP 110/82 | HR 86 | Temp 97.5°F | Wt 184.0 lb

## 2023-05-20 DIAGNOSIS — J157 Pneumonia due to Mycoplasma pneumoniae: Secondary | ICD-10-CM

## 2023-05-20 DIAGNOSIS — Z836 Family history of other diseases of the respiratory system: Secondary | ICD-10-CM

## 2023-05-20 DIAGNOSIS — Z5189 Encounter for other specified aftercare: Secondary | ICD-10-CM

## 2023-05-20 MED ORDER — CEFPODOXIME PROXETIL 200 MG PO TABS
200.0000 mg | ORAL_TABLET | Freq: Two times a day (BID) | ORAL | 0 refills | Status: AC
Start: 2023-05-20 — End: 2023-05-25

## 2023-05-20 MED ORDER — DOXYCYCLINE HYCLATE 100 MG PO CAPS
100.0000 mg | ORAL_CAPSULE | Freq: Two times a day (BID) | ORAL | 0 refills | Status: AC
Start: 2023-05-20 — End: 2023-05-25

## 2023-05-20 NOTE — Progress Notes (Signed)
[facility] [place] [facility]                       Date of Exam: 05/20/2023 2:44 PM        Patient ID: Melinda Brown is a 62 y.o. female.  Attending Physician: Posey Boyer, MD        Chief Complaint:    Chief Complaint   Patient presents with    Follow-up     Follow up pneumonia                HPI:    HPI    #PNA follow up from 04/20/23  #M.pneumoniae infection   - Diagnosed with PNA on 04/22/23 empirically   - CXR - no acute CP process 04/23/23  - Mycoplasma pneumoniae IgM - 979 - positive   - Urine legionella - negative  - CBC - grossly normal  - Compled course of Z-pack 11/17   - Did not complete course of Augmentin XR due to GI SE profile    - Cough is productive of sputum but still green   - She does endorse some dyspnea with walking  - No fevers or chills but endorses ongoing night sweats  - Her son and close contact also dx with walking pna recently  - She feels like PNA sx have not fully resolved   - She is getting married at the end of the month     Requested Pulm referral for hx of ILD in her mother   ILD etiology reported to be related to her mother's asthma hx     Discussed finding on CXR - tortuous aorta   No mention of aneurysm             Problem List:    Problem List[1]          Current Meds:    Medications Taking[2]       Allergies:    Allergies[3]          Past Surgical History:    Past Surgical History[4]        Family History:    Family History[5]        Social History:    Social History[6]        The following sections were reviewed this encounter by the provider:            Vital Signs:    BP 110/82 (BP Site: Right arm, Patient Position: Sitting, Cuff Size: Large)   Pulse 86   Temp 97.5 F (36.4 C)   Wt 83.5 kg (184 lb)   LMP  (LMP Unknown)   SpO2 97%   BMI 29.70 kg/m          ROS:    Review of Systems           Physical Exam:    Physical Exam   GEN: Alert, well appearing, in no acute distress  Head: normocephalic, atraumatic  Eyes: EOMI, PERRL,  sclera non-icteric  ENT: oropharynx clear, moist mucous membranes  Neck: supple, no cervical LAD, no thyromegaly  CV: regular rate and rhythm, no murmur or gallop, 2+ peripheral pulses  Pulm: normal respiratory effort on room air, no wheezes, crackles in the RLL   Ext: no edema, no joint swelling  Skin: skin is warm, no rashes  Neuro: face symmetric, moving all extremities, no gross deficits          Assessment:  1. Pneumonia due to Mycoplasma pneumoniae, unspecified laterality, unspecified part of lung  - cefpodoxime (VANTIN) 200 MG tablet; Take 1 tablet (200 mg) by mouth 2 (two) times daily for 5 days  Dispense: 10 tablet; Refill: 0  - doxycycline (VIBRAMYCIN) 100 MG capsule; Take 1 capsule (100 mg) by mouth 2 (two) times daily for 5 days  Dispense: 10 capsule; Refill: 0    2. Follow-up medical care requested by patient  - Referral to Pulmonology; Future    3. Family history of interstitial lung disease  - Referral to Pulmonology; Future            Plan:    1. Pneumonia due to Mycoplasma pneumoniae, unspecified laterality, unspecified part of lung  - Atypical PNA due to mycoplasma pneumoniae based on recent labs with strongly positive IgM  - S/p Z-pack course without significant improvement; and persistent RLL crackles/persistent productive cough/dyspnea and subjective fevers   - did not tolerate Augmentin XR course  - Due to moderate sx improvement, c/f resistant infection   - Will treat again with course of Doxycycline 100 mg BID x 5 days and Cefpodoxime 200 mg BID   - Defer CXR at this time as will not change management; discussed CXR previously normal even in the setting of positive mycoplasma pneumoniae testing   - No s/s of respiratory failure, stable on RA    Provided referral to Pulmonology on behalf of patient           Follow-up:    Return if symptoms worsen or fail to improve.         Posey Boyer, MD  --  Patient encounter was 30 minutes of total time spent on the date of the encounter: Preparing  to see the patient (reviewing prior notes and tests), obtaining and/or reviewing separately obtained history, performing a medically appropriate examination and/or evaluation, counseling and educating the patient/family/caregiver, ordering prescription medications, tests or procedures, documenting clinical information in the electronic health record, independently interpreting results and communicating results with the patient/family/caregiver and care coordination (not separately reported).            [1]   Patient Active Problem List  Diagnosis    Insomnia    Abnormal weight gain    Contact dermatitis    Coronary atherosclerosis due to calcified coronary lesion    Depressive disorder    Encounter for weight loss counseling    Mixed hyperlipidemia    Obesity    Obstructive sleep apnea syndrome    Overweight    Plantar fasciitis    Pure hypercholesterolemia, unspecified    Snoring    Serum creatinine raised    Right inguinal pain    Abnormal radiographic examination    Abnormal weight loss    Acne    Acute bacterial bronchitis    Acute pharyngitis    Candidal vulvovaginitis    Deviated nasal septum    Precordial pain    Restless legs    Sprain of ankle    Skin sensation disturbance    Vitamin D deficiency   [2]   Outpatient Medications Marked as Taking for the 05/20/23 encounter (Office Visit) with Posey Boyer, MD   Medication Sig Dispense Refill    diazePAM (VALIUM) 5 MG tablet Take 1 tablet (5 mg) by mouth nightly as needed (RLS) 30 tablet 5    rosuvastatin (CRESTOR) 10 MG tablet Take 1 tablet (10 mg) by mouth daily      Wegovy 2.4 MG/0.75ML injection INJECT  2.4 MG UNDER THE SKIN ONCE WEEKLY 3 mL 1    zolpidem (AMBIEN CR) 12.5 MG CR tablet TAKE ONE TABLET BY MOUTH AT BEDTIME AS NEEDED FOR SLEEP 90 tablet 1   [3]   Allergies  Allergen Reactions    Penicillins Nausea And Vomiting   [4]   Past Surgical History:  Procedure Laterality Date    ABDOMINOPLASTY, LIPOSUCTION (COSMETIC) Bilateral 02/26/2021     Procedure: ABDOMINOPLASTY-EXTENDED WITH LIPOSUCTION TO FLANKS;  Surgeon: Marybelle Killings, MD;  Location: [facility];  Service: Plastics;  Laterality: Bilateral;    APPENDECTOMY (OPEN)  06/08/2001    AUGMENTATION, BREAST, IMPLANT (MEDICAL) Bilateral 2011    BLEPHAROPLASTY, UPPER & LOWER, (MEDICAL) Bilateral 09/18/2020    Procedure: EYELID LIFT LOWER BILATERAL, EYELID LIFT UPPER WITH BROWPEXY BILATERAL, FACELIFT INCLUDES NECKLIFT, FAT GRAFTING: LIPS AND SMILE LINES (COSMETIC);  Surgeon: Marybelle Killings, MD;  Location: [facility];  Service: Plastics;  Laterality: Bilateral;  Eye Lid     COLONOSCOPY, DIAGNOSTIC (SCREENING)  07/10/2011    COLONOSCOPY, DIAGNOSTIC (SCREENING)  03/10/2022    DERMAL FAT GRAFT (COSMETIC) N/A 09/18/2020    Procedure: FAT GRAFTING: LIPS AND SMILE LINES (COSMETIC) FAT HARVESTED FROM ABDOMINE;  Surgeon: Marybelle Killings, MD;  Location: [facility] OR;  Service: Plastics;  Laterality: N/A;  Lip    FACELIFT, (RHYTIDECTOMY) (COSMETIC) Bilateral 09/18/2020    Procedure: FACELIFT INCLUDED NECKLIFT (COSMETIC);  Surgeon: Marybelle Killings, MD;  Location: [facility] OR;  Service: Plastics;  Laterality: Bilateral;    LIPOSUCTION, THIGHS (COSMETIC) Bilateral 02/26/2021    Procedure: BILATERAL INNER THIGH AND BILATERAL KNEE LIPOSUCTION(COSMETIC);  Surgeon: Marybelle Killings, MD;  Location: [facility];  Service: Plastics;  Laterality: Bilateral;    LUMPECTOMY Left 2011    VARICOSE VEIN SURGERY  10/06/2001    varicose vein stripping   [5]   Family History  Problem Relation Name Age of Onset    Heart disease Mother      Asthma Daughter          OLDEST   [6]   Social History  Tobacco Use    Smoking status: Never     Passive exposure: Never    Smokeless tobacco: Never   Vaping Use    Vaping status: Never Used   Substance Use Topics    Alcohol use: Yes     Alcohol/week: 6.0 standard drinks of alcohol     Types: 6 Cans of beer per week    Drug use: Never

## 2023-06-15 ENCOUNTER — Ambulatory Visit: Payer: Medicaid Other | Admitting: Family Medicine

## 2023-06-15 ENCOUNTER — Encounter: Payer: Self-pay | Admitting: Family Medicine

## 2023-06-15 VITALS — BP 186/94 | HR 100 | Temp 97.8°F | Resp 18 | Ht 62.0 in | Wt 124.2 lb

## 2023-06-15 DIAGNOSIS — L84 Corns and callosities: Secondary | ICD-10-CM | POA: Diagnosis not present

## 2023-06-15 DIAGNOSIS — I10 Essential (primary) hypertension: Secondary | ICD-10-CM | POA: Diagnosis not present

## 2023-06-15 DIAGNOSIS — Z23 Encounter for immunization: Secondary | ICD-10-CM

## 2023-06-15 DIAGNOSIS — F17200 Nicotine dependence, unspecified, uncomplicated: Secondary | ICD-10-CM | POA: Diagnosis not present

## 2023-06-15 DIAGNOSIS — S238XXA Sprain of other specified parts of thorax, initial encounter: Secondary | ICD-10-CM | POA: Diagnosis not present

## 2023-06-15 DIAGNOSIS — Z1231 Encounter for screening mammogram for malignant neoplasm of breast: Secondary | ICD-10-CM

## 2023-06-15 DIAGNOSIS — Z780 Asymptomatic menopausal state: Secondary | ICD-10-CM

## 2023-06-15 DIAGNOSIS — Z1211 Encounter for screening for malignant neoplasm of colon: Secondary | ICD-10-CM

## 2023-06-15 MED ORDER — LOSARTAN POTASSIUM 25 MG PO TABS
25.0000 mg | ORAL_TABLET | Freq: Every day | ORAL | 1 refills | Status: AC
Start: 2023-06-15 — End: ?

## 2023-06-15 MED ORDER — VARENICLINE TARTRATE (STARTER) 0.5 MG X 11 & 1 MG X 42 PO TBPK
ORAL_TABLET | ORAL | 0 refills | Status: AC
Start: 2023-06-15 — End: ?

## 2023-06-15 NOTE — Assessment & Plan Note (Signed)
 Has painful callus plantar surface right foot.  Referral to podiatry for definitive care.

## 2023-06-15 NOTE — Assessment & Plan Note (Signed)
 She is on Dupixent for her eczema.  Advised she needs a flu shot and pneumonia vaccine.  She will need to go to the drugstore to get her flu vaccine.  Prevnar 20 given today

## 2023-06-15 NOTE — Assessment & Plan Note (Signed)
 She needs a bone density scan given her risk of small frame, postmenopausal and tobacco addiction.

## 2023-06-15 NOTE — Assessment & Plan Note (Signed)
 Started on losartan 25 mg daily.  Needs follow-up in a month for recheck of blood pressure and basic chemistry.

## 2023-06-15 NOTE — Assessment & Plan Note (Signed)
 Has tenderness over the rhomboid right and muscle spasms felt.  Asked her to try Salonpas.

## 2023-06-15 NOTE — Assessment & Plan Note (Addendum)
 She would like to quit smoking.  She is willing to try Chantix.  Advised if she were to have feelings of self-harm she would need to immediately stop taking the Chantix.  Also advised Chantix since the causes vivid dreaming.

## 2023-06-15 NOTE — Progress Notes (Signed)
 Established Patient Office Visit  Subjective   Patient ID: SUE FERNICOLA, female    DOB: July 22, 1960  Age: 63 y.o. MRN: [id number]  Chief Complaint  Patient presents with   Medical Management of Chronic Issues    HPI Latrice feels really well today.  She reports that her voice is a lot better.  She saw [facility] speech pathology and neurology and ended up with no treatment.  Blood pressure is very elevated today 189/77 and 173/84.  She was smoking before she came in the building. She is interested in quitting smoking she like to try Chantix . Her foot hurts is the same callus we have been working on.  She does not want me to trim it down today but would like a referral to podiatry for more definitive treatment. She is having pain in her right scapula she carried 2 heavy trash cans out and thinks maybe that is what happened she has no numbness and tingling and or shooting pain in either arm. Her eczema is under good control with dermatology she is on Dupixent.  Asked her to consider vaccinations for influenza and pneumonia.    ROS    Objective:     BP (!) 186/94 (BP Location: Right Arm, Patient Position: Sitting, Cuff Size: Normal)   Pulse 100   Temp 97.8 F (36.6 C) (Oral)   Resp 18   Ht 5' 2 (1.575 m)   Wt 124 lb 3.2 oz (56.3 kg)   SpO2 99%   BMI 22.72 kg/m    Physical Exam Vitals and nursing note reviewed.  Constitutional:      Appearance: Normal appearance.  HENT:     Head: Normocephalic and atraumatic.  Eyes:     Conjunctiva/sclera: Conjunctivae normal.  Cardiovascular:     Rate and Rhythm: Normal rate and regular rhythm.  Pulmonary:     Effort: Pulmonary effort is normal.     Breath sounds: Normal breath sounds.  Musculoskeletal:     Right lower leg: No edema.     Left lower leg: No edema.  Feet:     Comments: 0.3cm painful callus right heel area Skin:    General: Skin is warm and dry.  Neurological:     Mental Status: She is alert and oriented to person,  place, and time.  Psychiatric:        Mood and Affect: Mood normal.        Behavior: Behavior normal.        Thought Content: Thought content normal.        Judgment: Judgment normal.          No results found for any visits on 06/15/23.    The ASCVD Risk score (Arnett DK, et al., 2019) failed to calculate for the following reasons:   Unable to determine if patient is Non-Hispanic African American    Assessment & Plan:  Encounter for screening mammogram for malignant neoplasm of breast -     Digital Screening Mammogram, Left and Right; Future  Screening for colon cancer -     Ambulatory referral to Gastroenterology  Immunization due Assessment & Plan: She is on Dupixent for her eczema.  Advised she needs a flu shot and pneumonia vaccine.  She will need to go to the drugstore to get her flu vaccine.  Prevnar 20 given today   Primary hypertension Assessment & Plan: Started on losartan  25 mg daily.  Needs follow-up in a month for recheck of blood pressure and basic  chemistry.  Orders: -     Losartan  Potassium; Take 1 tablet (25 mg total) by mouth daily.  Dispense: 30 tablet; Refill: 1  Callus of foot Assessment & Plan: Has painful callus plantar surface right foot.  Referral to podiatry for definitive care.  Orders: -     Ambulatory referral to Podiatry  Sprain of rhomboid, initial encounter Assessment & Plan: Has tenderness over the rhomboid right and muscle spasms felt.  Asked her to try Salonpas.   Tobacco dependence Assessment & Plan: She would like to quit smoking.  She is willing to try Chantix .  Advised if she were to have feelings of self-harm she would need to immediately stop taking the Chantix .  Also advised Chantix  since the causes vivid dreaming.  Orders: -     Varenicline  Tartrate (Starter); Use as directed on the package.  Dispense: 53 each; Refill: 0  Postmenopausal estrogen deficiency Assessment & Plan: She needs a bone density scan given her  risk of small frame, postmenopausal and tobacco addiction.  Orders: -     DG Bone Density; Future  Need for pneumococcal 20-valent conjugate vaccination -     Pneumococcal conjugate vaccine 20-valent     Return in about 4 weeks (around 07/13/2023) for HTN follow up.    [place] K Lyden Redner, MD

## 2023-06-16 ENCOUNTER — Telehealth (INDEPENDENT_AMBULATORY_CARE_PROVIDER_SITE_OTHER): Payer: Self-pay | Admitting: Family Medicine

## 2023-06-16 NOTE — Telephone Encounter (Signed)
 Former pt of Dr. Christy Sartorius, requesting to have Dr. Lorin Picket become her new pcp. Pt says Dr. Christy Sartorius recommended her. Ok to switch pcp to Dr.Scott?

## 2023-06-16 NOTE — Addendum Note (Signed)
 Addended by: Benay Pike on: 06/16/2023 12:06 PM   Modules accepted: Orders

## 2023-06-21 ENCOUNTER — Other Ambulatory Visit: Payer: Self-pay

## 2023-06-21 ENCOUNTER — Telehealth: Payer: Self-pay | Admitting: Family Medicine

## 2023-06-21 DIAGNOSIS — L84 Corns and callosities: Secondary | ICD-10-CM

## 2023-06-21 NOTE — Telephone Encounter (Signed)
Pt advised  Via VM

## 2023-06-21 NOTE — Telephone Encounter (Signed)
 Ok to add to my panel.  Please advise patient of long wait times to be seen    If any immediate needs, please schedule with 1st available provider.  Dr. Lorin Picket

## 2023-06-21 NOTE — Telephone Encounter (Signed)
 Pt called in because she requested a referral to a podiatrist during her last visit with Dr. Girtha Rm. Pt would a referral.

## 2023-07-01 ENCOUNTER — Encounter: Payer: Self-pay | Admitting: *Deleted

## 2023-07-13 ENCOUNTER — Ambulatory Visit (INDEPENDENT_AMBULATORY_CARE_PROVIDER_SITE_OTHER): Payer: BLUE CROSS/BLUE SHIELD

## 2023-07-13 ENCOUNTER — Ambulatory Visit (INDEPENDENT_AMBULATORY_CARE_PROVIDER_SITE_OTHER): Payer: Medicaid Other | Admitting: Family Medicine

## 2023-07-13 ENCOUNTER — Encounter: Payer: Self-pay | Admitting: Family Medicine

## 2023-07-13 VITALS — BP 102/68 | HR 98 | Temp 98.0°F | Resp 18 | Ht 66.0 in | Wt 184.4 lb

## 2023-07-13 VITALS — BP 166/86 | HR 98 | Temp 97.9°F | Resp 14 | Ht 62.0 in | Wt 119.8 lb

## 2023-07-13 DIAGNOSIS — I10 Essential (primary) hypertension: Secondary | ICD-10-CM | POA: Diagnosis not present

## 2023-07-13 DIAGNOSIS — Z713 Dietary counseling and surveillance: Secondary | ICD-10-CM

## 2023-07-13 DIAGNOSIS — G4700 Insomnia, unspecified: Secondary | ICD-10-CM

## 2023-07-13 DIAGNOSIS — Z8701 Personal history of pneumonia (recurrent): Secondary | ICD-10-CM

## 2023-07-13 DIAGNOSIS — Z836 Family history of other diseases of the respiratory system: Secondary | ICD-10-CM

## 2023-07-13 DIAGNOSIS — Z6829 Body mass index (BMI) 29.0-29.9, adult: Secondary | ICD-10-CM

## 2023-07-13 MED ORDER — SEMAGLUTIDE-WEIGHT MANAGEMENT 2.4 MG/0.75ML SC SOAJ
2.4000 mg | SUBCUTANEOUS | 0 refills | Status: DC
Start: 2023-07-13 — End: 2023-10-11

## 2023-07-13 MED ORDER — ZOLPIDEM TARTRATE ER 12.5 MG PO TBCR
12.5000 mg | EXTENDED_RELEASE_TABLET | Freq: Every evening | ORAL | 1 refills | Status: AC | PRN
Start: 2023-07-13 — End: ?

## 2023-07-13 NOTE — Assessment & Plan Note (Addendum)
Checking her renal function.  If this is good will change to losartan-hydrochlorothiazide 50/12.5mg . If renal function is affected then will start amlodipine.    Recheck in a month.  Please avoid salt.

## 2023-07-13 NOTE — Progress Notes (Signed)
 [facility] - [facility] [place] [facility]                       Date of Exam: 07/13/2023 3:28 PM        Patient ID: Melinda Brown is a 63 y.o. female.  Attending Physician: Rosaline LELON Oms, FNP        Chief Complaint:    Chief Complaint   Patient presents with    Follow-up     Medication- for weight loss  Need refills on wegovy   Hx of heart condition               HPI:    Patient presents today for medication management     Patient is on a weight loss journey  She is currently taking wegovy  2.4 mg once weekly  She has been taking wegovy  since 09/2022  Total weight loss has been about 16 lbs since starting therapy   She is working on healthy lifestyle measures including diet and exercise  She would like to loose about 20 more lbs then titrate off the medication     Hx of insomnia  She is currently taking ambien  12.5 mg as needed   She has not been sleeping 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, 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 a job or not     Patient was recently diagnosed and treated for pneumonia  She was supposed to follow up with pulmonology given her mother's history of interstitial lung disease but there was some confusion on the referral  The symptoms of pneumonia have resolved   She needs a new referral today              Problem List:    Problem List[1]          Current Meds:    Medications Taking[2]       Allergies:    Allergies[3]          Past Surgical History:    Past Surgical History[4]        Family History:    Family History[5]        Social History:    Social History[6]        The following sections were reviewed this encounter by the provider:            Vital Signs:    BP 102/68   Pulse 98   Temp 98 F (36.7 C)   Resp 18   Ht 1.676 m (5' 6)   Wt 83.6 kg (184 lb 6.4 oz)   LMP  (LMP Unknown)   SpO2 98%   BMI 29.76 kg/m          ROS:    Review of Systems   Constitutional: Negative.    HENT: Negative.     Respiratory: Negative.     Cardiovascular:  Negative.    Gastrointestinal: Negative.    Genitourinary: Negative.    Musculoskeletal: Negative.    Neurological: Negative.    Psychiatric/Behavioral:  Positive for sleep disturbance.               Physical Exam:    Physical Exam  Vitals and nursing note reviewed.   Constitutional:       Appearance: Normal appearance.   HENT:      Head: Normocephalic and atraumatic.      Nose: Nose normal.      Mouth/Throat:  Mouth: Mucous membranes are moist.      Pharynx: Oropharynx is clear.   Eyes:      Extraocular Movements: Extraocular movements intact.      Conjunctiva/sclera: Conjunctivae normal.      Pupils: Pupils are equal, round, and reactive to light.   Cardiovascular:      Rate and Rhythm: Normal rate and regular rhythm.      Pulses: Normal pulses.      Heart sounds: Normal heart sounds.   Pulmonary:      Effort: Pulmonary effort is normal.      Breath sounds: Normal breath sounds.   Musculoskeletal:         General: Normal range of motion.      Cervical back: Normal range of motion and neck supple.   Skin:     General: Skin is warm and dry.   Neurological:      General: No focal deficit present.      Mental Status: She is alert and oriented to person, place, and time.   Psychiatric:         Mood and Affect: Mood normal.         Behavior: Behavior normal.         Thought Content: Thought content normal.         Judgment: Judgment normal.              Assessment:    1. Encounter for weight loss counseling    2. BMI 29.0-29.9,adult  - semaglutide  (Wegovy ) 2.4 MG/0.75ML injection; Inject 0.75 mLs (2.4 mg) into the skin once a week for 28 days  Dispense: 3 mL; Refill: 0    3. Insomnia, unspecified type  - zolpidem  (AMBIEN  CR) 12.5 MG CR tablet; Take 1 tablet (12.5 mg) by mouth nightly as needed for Sleep  Dispense: 90 tablet; Refill: 1    4. Family history of interstitial lung disease  - Referral to Pulmonology; Future    5. History of pneumonia  - Referral to Pulmonology; Future            Plan:      Continue wegovy   as prescribed for weight loss  Reviewed side effects  Continue healthy lifestyle measures for weight loss  Patient to reach out via mychart for refills   Continue zolpidem  as prescribed for insomnia  Reviewed healthy sleep hygiene habits  Referral to pulmonology provided for family hx of interstitial lung disease   Red flag sxs reviewed           Follow-up:    Return in about 6 months (around 01/10/2024) for WWE.         Rosaline LELON Oms, FNP          [1]   Patient Active Problem List  Diagnosis    Insomnia    Abnormal weight gain    Contact dermatitis    Coronary atherosclerosis due to calcified coronary lesion    Depressive disorder    Encounter for weight loss counseling    Mixed hyperlipidemia    Obesity    Obstructive sleep apnea syndrome    Overweight    Plantar fasciitis    Pure hypercholesterolemia, unspecified    Snoring    Serum creatinine raised    Right inguinal pain    Abnormal radiographic examination    Abnormal weight loss    Acne    Acute bacterial bronchitis    Acute pharyngitis    Candidal vulvovaginitis  Deviated nasal septum    Precordial pain    Restless legs    Sprain of ankle    Skin sensation disturbance    Vitamin D deficiency   [2]   Outpatient Medications Marked as Taking for the 07/13/23 encounter (Office Visit) with Blain Rosaline ORN, FNP   Medication Sig Dispense Refill    diazePAM  (VALIUM ) 5 MG tablet Take 1 tablet (5 mg) by mouth nightly as needed (RLS) 30 tablet 5    rosuvastatin  (CRESTOR ) 10 MG tablet Take 1 tablet (10 mg) by mouth daily      [DISCONTINUED] Wegovy  2.4 MG/0.75ML injection INJECT 2.4 MG UNDER THE SKIN ONCE WEEKLY 3 mL 1    [DISCONTINUED] zolpidem  (AMBIEN  CR) 12.5 MG CR tablet TAKE ONE TABLET BY MOUTH AT BEDTIME AS NEEDED FOR SLEEP 90 tablet 1   [3]   Allergies  Allergen Reactions    Penicillins Nausea And Vomiting   [4]   Past Surgical History:  Procedure Laterality Date    ABDOMINOPLASTY, LIPOSUCTION (COSMETIC) Bilateral 02/26/2021    Procedure:  ABDOMINOPLASTY-EXTENDED WITH LIPOSUCTION TO FLANKS;  Surgeon: Donnise Zachary PARAS, MD;  Location: [facility] OR;  Service: Plastics;  Laterality: Bilateral;    APPENDECTOMY (OPEN)  06/08/2001    AUGMENTATION, BREAST, IMPLANT (MEDICAL) Bilateral 2011    BLEPHAROPLASTY, UPPER & LOWER, (MEDICAL) Bilateral 09/18/2020    Procedure: EYELID LIFT LOWER BILATERAL, EYELID LIFT UPPER WITH BROWPEXY BILATERAL, FACELIFT INCLUDES NECKLIFT, FAT GRAFTING: LIPS AND SMILE LINES (COSMETIC);  Surgeon: Donnise Zachary PARAS, MD;  Location: [facility] SUR[facility] OR;  Service: Plastics;  Laterality: Bilateral;  Eye Lid     COLONOSCOPY, DIAGNOSTIC (SCREENING)  07/10/2011    COLONOSCOPY, DIAGNOSTIC (SCREENING)  03/10/2022    DERMAL FAT GRAFT (COSMETIC) N/A 09/18/2020    Procedure: FAT GRAFTING: LIPS AND SMILE LINES (COSMETIC) FAT HARVESTED FROM ABDOMINE;  Surgeon: Donnise Zachary PARAS, MD;  Location: [facility];  Service: Plastics;  Laterality: N/A;  Lip    FACELIFT, (RHYTIDECTOMY) (COSMETIC) Bilateral 09/18/2020    Procedure: FACELIFT INCLUDED NECKLIFT (COSMETIC);  Surgeon: Donnise Zachary PARAS, MD;  Location: [facility] OR;  Service: Plastics;  Laterality: Bilateral;    LIPOSUCTION, THIGHS (COSMETIC) Bilateral 02/26/2021    Procedure: BILATERAL INNER THIGH AND BILATERAL KNEE LIPOSUCTION(COSMETIC);  Surgeon: Donnise Zachary PARAS, MD;  Location: [facility];  Service: Plastics;  Laterality: Bilateral;    LUMPECTOMY Left 2011    VARICOSE VEIN SURGERY  10/06/2001    varicose vein stripping   [5]   Family History  Problem Relation Name Age of Onset    Heart disease Mother      Asthma Daughter          OLDEST   [6]   Social History  Tobacco Use    Smoking status: Never     Passive exposure: Never    Smokeless tobacco: Never   Vaping Use    Vaping status: Never Used   Substance Use Topics    Alcohol use: Yes     Alcohol/week: 6.0 standard drinks of alcohol     Types: 6 Cans of beer per week    Drug use: Never

## 2023-07-13 NOTE — Progress Notes (Signed)
   Established Patient Office Visit  Subjective   Patient ID: Lynn Black, female    DOB: 1960-11-16  Age: 63 y.o. MRN: [id number]  Chief Complaint  Patient presents with   Medical Management of Chronic Issues    HTN    HPI hypertension blood pressure was elevated at 168/90 and then second check 168/86.  She is on losartan  25 mg daily no side effects whatsoever.  Discussed the silent killer.  Denies chest pain, headache ,shortness of breath, peripheral edema, PND and orthopnea. Offered her mammogram but she declined. Offered her bone density scan she declined Going to see podiatry tomorrow about the painful callus on her foot.     ROS    Objective:     BP (!) 166/86 (BP Location: Right Arm, Patient Position: Sitting, Cuff Size: Normal)   Pulse 98   Temp 97.9 F (36.6 C) (Oral)   Resp 14   Ht 5' 2 (1.575 m) Comment: per patient  Wt 119 lb 12.8 oz (54.3 kg)   SpO2 100%   BMI 21.91 kg/m    Physical Exam Vitals and nursing note reviewed.  Constitutional:      Appearance: Normal appearance.  HENT:     Head: Normocephalic and atraumatic.  Eyes:     Conjunctiva/sclera: Conjunctivae normal.  Cardiovascular:     Rate and Rhythm: Normal rate and regular rhythm.  Pulmonary:     Effort: Pulmonary effort is normal.     Breath sounds: Normal breath sounds.  Musculoskeletal:     Right lower leg: No edema.     Left lower leg: No edema.  Skin:    General: Skin is warm and dry.  Neurological:     Mental Status: She is alert and oriented to person, place, and time.  Psychiatric:        Mood and Affect: Mood normal.        Behavior: Behavior normal.        Thought Content: Thought content normal.        Judgment: Judgment normal.          No results found for any visits on 07/13/23.    The ASCVD Risk score (Arnett DK, et al., 2019) failed to calculate for the following reasons:   Unable to determine if patient is Non-Hispanic African American    Assessment &  Plan:  Primary hypertension Assessment & Plan: Checking her renal function.  If this is good will change to losartan -hydrochlorothiazide 50/12.5mg . If renal function is affected then will start amlodipine.    Recheck in a month.  Please avoid salt.    Orders: -     Basic metabolic panel     Return in about 4 weeks (around 08/10/2023).    Kilie Rund K Marci Polito, MD

## 2023-07-14 ENCOUNTER — Encounter: Payer: Self-pay | Admitting: Podiatry

## 2023-07-14 ENCOUNTER — Encounter: Payer: Self-pay | Admitting: Family Medicine

## 2023-07-14 ENCOUNTER — Ambulatory Visit: Payer: Medicaid Other | Admitting: Podiatry

## 2023-07-14 DIAGNOSIS — D2370 Other benign neoplasm of skin of unspecified lower limb, including hip: Secondary | ICD-10-CM

## 2023-07-14 LAB — BASIC METABOLIC PANEL
BUN/Creatinine Ratio: 15 (ref 12–28)
BUN: 17 mg/dL (ref 8–27)
CO2: 18 mmol/L — ABNORMAL LOW (ref 20–29)
Calcium: 10.1 mg/dL (ref 8.7–10.3)
Chloride: 98 mmol/L (ref 96–106)
Creatinine, Ser: 1.12 mg/dL — ABNORMAL HIGH (ref 0.57–1.00)
Glucose: 106 mg/dL — ABNORMAL HIGH (ref 70–99)
Potassium: 4.1 mmol/L (ref 3.5–5.2)
Sodium: 136 mmol/L (ref 134–144)
eGFR: 56 mL/min/{1.73_m2} — ABNORMAL LOW (ref >59)

## 2023-07-14 NOTE — Patient Instructions (Signed)
 Look for salicylic acid 40% cream medicated pads, or ointment and apply to the skin lesions This can be bought over the counter, at a pharmacy or online such as [employer].

## 2023-07-15 NOTE — Progress Notes (Signed)
  Subjective:  Patient ID: Lynn Black, female    DOB: June 09, 1960,  MRN: [id number]  Chief Complaint  Patient presents with   Callouses    My doctor seems to think I have calluses.  I have one on both feet.  The right one is burning and stinging.  It hurts really bad.  I walk a lot at work.    63 y.o. female presents with the above complaint. History confirmed with patient.   Objective:  Physical Exam: warm, good capillary refill, no trophic changes or ulcerative lesions, normal DP and PT pulses, normal sensory exam, and benign-appearing painful hypertrophic skin lesion bilateral plantar forefoot.  Assessment:   1. Benign neoplasm of skin of lower extremity, unspecified laterality      Plan:  Patient was evaluated and treated and all questions answered.  Lesions debrided sharply with a scalpel to nucleate and expose the lesion central portion.  Discussed treatment of this including blistering agents.  Salicylic acid treatment applied.  Leave on for 24 hours then wash with soap and water.  Advised to use this as at home for treatment as well with continued salicylic acid use.  Return to see us  as needed if it does not improve or worsens.  Return if symptoms worsen or fail to improve.

## 2023-07-19 ENCOUNTER — Encounter: Payer: Self-pay | Admitting: *Deleted

## 2023-08-16 ENCOUNTER — Telehealth (INDEPENDENT_AMBULATORY_CARE_PROVIDER_SITE_OTHER): Payer: Self-pay

## 2023-08-16 NOTE — Telephone Encounter (Signed)
 Zolpidem  Tartrate ER 12.5MG  er tablets  Key: [id number]  PA Case ID #: [id number]  Prior authorization submitted using Covermymeds  APPROVED  07/17/2023 through 08/15/2024    Patient was notified using MyChartMsg    Prior Authorization Questions:  1.What is the patient's age in years? 63    2.What is the patient's diagnosis? Insomnia    3.Does the prescriber agree to discontinue the sedative hypnotic if the patient experiences a complex sleep behavior, such as sleep-walking or sleep-driving? YES    4.Will this medication be used in combination with another Prior Authorization (PA) sleep aid or with an oxybate product such as Xyrem or Xywav? No    5.Which strength of medication is being requested? 12.5mg     6.Will the patient need more than 90 tablets every 90 days? No

## 2023-09-02 ENCOUNTER — Encounter (INDEPENDENT_AMBULATORY_CARE_PROVIDER_SITE_OTHER): Payer: Self-pay | Admitting: Student

## 2023-10-10 ENCOUNTER — Other Ambulatory Visit (INDEPENDENT_AMBULATORY_CARE_PROVIDER_SITE_OTHER): Payer: Self-pay

## 2023-10-10 DIAGNOSIS — Z6829 Body mass index (BMI) 29.0-29.9, adult: Secondary | ICD-10-CM

## 2023-10-11 NOTE — Telephone Encounter (Signed)
 Patient due for weight loss follow up. Please schedule

## 2023-10-11 NOTE — Telephone Encounter (Signed)
 Pt scheduled 11/10/2023

## 2023-11-10 ENCOUNTER — Encounter (INDEPENDENT_AMBULATORY_CARE_PROVIDER_SITE_OTHER): Payer: Self-pay | Admitting: Family Medicine

## 2023-11-10 ENCOUNTER — Encounter (INDEPENDENT_AMBULATORY_CARE_PROVIDER_SITE_OTHER): Payer: Self-pay

## 2023-11-10 ENCOUNTER — Telehealth (INDEPENDENT_AMBULATORY_CARE_PROVIDER_SITE_OTHER): Payer: Self-pay | Admitting: Family Medicine

## 2023-11-10 ENCOUNTER — Ambulatory Visit (FREE_STANDING_LABORATORY_FACILITY): Payer: BLUE CROSS/BLUE SHIELD | Admitting: Family Medicine

## 2023-11-10 VITALS — BP 110/72 | HR 90 | Temp 99.1°F | Ht 66.0 in | Wt 189.0 lb

## 2023-11-10 DIAGNOSIS — E6609 Other obesity due to excess calories: Secondary | ICD-10-CM

## 2023-11-10 DIAGNOSIS — I2510 Atherosclerotic heart disease of native coronary artery without angina pectoris: Secondary | ICD-10-CM

## 2023-11-10 DIAGNOSIS — F321 Major depressive disorder, single episode, moderate: Secondary | ICD-10-CM | POA: Insufficient documentation

## 2023-11-10 DIAGNOSIS — I2584 Coronary atherosclerosis due to calcified coronary lesion: Secondary | ICD-10-CM

## 2023-11-10 DIAGNOSIS — Z23 Encounter for immunization: Secondary | ICD-10-CM

## 2023-11-10 DIAGNOSIS — Z1231 Encounter for screening mammogram for malignant neoplasm of breast: Secondary | ICD-10-CM

## 2023-11-10 DIAGNOSIS — E7800 Pure hypercholesterolemia, unspecified: Secondary | ICD-10-CM

## 2023-11-10 DIAGNOSIS — Z0000 Encounter for general adult medical examination without abnormal findings: Secondary | ICD-10-CM

## 2023-11-10 DIAGNOSIS — E66811 Obesity, class 1: Secondary | ICD-10-CM

## 2023-11-10 DIAGNOSIS — F5104 Psychophysiologic insomnia: Secondary | ICD-10-CM

## 2023-11-10 DIAGNOSIS — Z6830 Body mass index (BMI) 30.0-30.9, adult: Secondary | ICD-10-CM

## 2023-11-10 LAB — COMPREHENSIVE METABOLIC PANEL
ALT: 60 U/L — ABNORMAL HIGH (ref <=55)
AST (SGOT): 49 U/L — ABNORMAL HIGH (ref <=41)
Albumin/Globulin Ratio: 1.3 (ref 0.9–2.2)
Albumin: 4.1 g/dL (ref 3.5–5.0)
Alkaline Phosphatase: 111 U/L (ref 37–117)
Anion Gap: 11 (ref 5.0–15.0)
BUN: 11 mg/dL (ref 7–21)
Bilirubin, Total: 0.5 mg/dL (ref 0.2–1.2)
CO2: 24 meq/L (ref 17–29)
Calcium: 9.8 mg/dL (ref 8.5–10.5)
Chloride: 107 meq/L (ref 99–111)
Creatinine: 0.8 mg/dL (ref 0.4–1.0)
GFR: >60 mL/min/1.73 m2 (ref >=60.0)
Globulin: 3.2 g/dL (ref 2.0–3.6)
Glucose: 89 mg/dL (ref 70–100)
Hemolysis Index: 4 {index}
Potassium: 4.8 meq/L (ref 3.5–5.3)
Protein, Total: 7.3 g/dL (ref 6.0–8.3)
Sodium: 142 meq/L (ref 135–145)

## 2023-11-10 LAB — LAB USE ONLY - CBC WITH DIFFERENTIAL
Absolute Basophils: 0.05 x10 3/uL (ref 0.00–0.08)
Absolute Eosinophils: 0.09 x10 3/uL (ref 0.00–0.44)
Absolute Immature Granulocytes: 0.01 x10 3/uL (ref 0.00–0.07)
Absolute Lymphocytes: 1.19 x10 3/uL (ref 0.42–3.22)
Absolute Monocytes: 0.51 x10 3/uL (ref 0.21–0.85)
Absolute Neutrophils: 2.55 x10 3/uL (ref 1.10–6.33)
Absolute nRBC: 0 x10 3/uL (ref <=0.00)
Basophils %: 1.1 %
Eosinophils %: 2 %
Hematocrit: 41.6 % (ref 34.7–43.7)
Hemoglobin: 14.4 g/dL (ref 11.4–14.8)
Immature Granulocytes %: 0.2 %
Lymphocytes %: 27 %
MCH: 30.6 pg (ref 25.1–33.5)
MCHC: 34.6 g/dL (ref 31.5–35.8)
MCV: 88.5 fL (ref 78.0–96.0)
MPV: 11.3 fL (ref 8.9–12.5)
Monocytes %: 11.6 %
Neutrophils %: 58.1 %
Platelet Count: 185 x10 3/uL (ref 142–346)
Preliminary Absolute Neutrophil Count: 2.55 x10 3/uL (ref 1.10–6.33)
RBC: 4.7 x10 6/uL (ref 3.90–5.10)
RDW: 13 % (ref 11–15)
WBC: 4.4 x10 3/uL (ref 3.10–9.50)
nRBC %: 0 /100{WBCs} (ref <=0.0)

## 2023-11-10 LAB — LIPID PANEL
Cholesterol / HDL Ratio: 2.7 {index}
Cholesterol: 190 mg/dL (ref <=199)
HDL: 70 mg/dL (ref >=40)
LDL Calculated: 104 mg/dL — ABNORMAL HIGH (ref 0–99)
Triglycerides: 80 mg/dL (ref 34–149)
VLDL Calculated: 16 mg/dL (ref 10–40)

## 2023-11-10 LAB — THYROID STIMULATING HORMONE (TSH) WITH REFLEX TO FREE T4: TSH: 1.63 u[IU]/mL (ref 0.35–4.94)

## 2023-11-10 LAB — HEMOGLOBIN A1C
Average Estimated Glucose: 99.7 mg/dL
Hemoglobin A1C: 5.1 % (ref 4.6–5.6)

## 2023-11-10 MED ORDER — TRAZODONE HCL 50 MG PO TABS
50.0000 mg | ORAL_TABLET | Freq: Every evening | ORAL | 5 refills | Status: AC
Start: 2023-11-10 — End: ?

## 2023-11-10 MED ORDER — TIRZEPATIDE-WEIGHT MANAGEMENT 2.5 MG/0.5ML SC SOAJ
2.5000 mg | SUBCUTANEOUS | 0 refills | Status: DC
Start: 2023-11-10 — End: 2023-11-30

## 2023-11-10 NOTE — Telephone Encounter (Signed)
 Medication Name and Strength: Zepbound 2.5MG /0.5ML pen-injectors  Key: [id number]  Rx: [id number]    Prior authorization submitted using Covermymeds  PENDING     Prior Authorization Questions: FEP - WEIGHT LOSS ZEPBOUND SO BO NON PREFERRED (10/01/2023)  FEP - WEIGHT LOSS ZEPBOUND SO BO NON PREFERRED (10/01/2023)  What is the patient's age in years?     What is the patient's diagnosis?  Obesity, used for chronic weight management    We would like to advise you of alternative formulary products for your patient. The products are Saxenda and Wegovy . Would you like to switch the patient to Saxenda or Wegovy ? Medical offices requesting ePA: If switching to an alternative formulary product, please cancel this request and resubmit for the alternative formulary product.  Yes, switch to Saxenda.  Yes, switch to Wegovy .  No, do not switch.    Has the patient been on this medication continuously for the last 4 months excluding samples?  NO    What is the patient's body mass index (BMI) in kilograms per square meter (kg/m2)?  Greater than or equal to 30 kg/m2    Does the patient have an intolerance or contraindication or have they had an inadequate treatment response to at least TWO oral medications for weight management? Oral medications for weight management include: benzphetamine, diethylpropion, phentermine , Qsymia, etc.    NO    Will the patient need more than 12 single-dose pens every 84 days?    No

## 2023-11-10 NOTE — Telephone Encounter (Signed)
 Melinda Brown,  Patient has known CAD - documented on Coronary artery CTA. She has  a serious medical comorbidity. I would like to re-submit PA for GLP 1 therapy for weight loss. Specifically I would like tirzepatide (ZEPBOUND).      Please submit PA and let me know if any additional information needed.    Dr. Glendia

## 2023-11-10 NOTE — Progress Notes (Signed)
 [facility]               Subjective     Chief Complaint   Patient presents with    Annual Exam     History of Present Illness    Melinda Brown is a 63 year old female with coronary artery disease who presents for annual well visit.  She is also here to discuss cardiovascular disease plan of care after being diagnosed with atherosclerosis w/ abnormal coronary calcium  score. She would also like to discuss weight management.    She has a high coronary calcium  score of 167, with evidence of occlusion of the left anterior descending artery. She is on statin therapy and GLP-1 therapy for weight loss. She initially used Ozempic , switched to Wegovy , and is considering Zepbound due to better weight management results associated with this medication and cost concerns.    Her family history includes significant heart disease, with her father having a double bypass at 73 y/o and her grandfather dying of a heart attack around age 78. Both parents and her sister are on statins. She has high cholesterol but no hypertension or diabetes. She has never been tested for lipoprotein d/o.    PREVENTATIVE HEALTH SCREENINGS:  GYN HEALTH/LAST PAP: Her last pap smear was 2-3 years ago.  Normal  STD RISK ASSESSMENT: Monogamous/low risk/declines screening today.  BREAST HEALTH RISK ASSESSMENT: FMHx: No breast CA; She has no breast concerns.  Due for mammogram and US  due to h/o of augmentation. Had lump removed 7 years ago- benign.   BONE HEALTH RISK ASSESSMENT: No FMHx of osteoporosis. No h/o steroid use, no long term provera use or unexplained fractures.  COLON CA RISK ASSESSMENT: No FMHx colon CA; She has normal BM's.   Patient had colonoscopy 03/2022 with Dr. Dale Coria. Had polypectomy - recommend repeat screening 2028  CARDIOVASCULAR RISK ASSESSMENT: FMHx CAD-CABG x 2 (father in his 44's; PGF- died at 36 of MI), Hyperlipidemia (father, mother, sister), CVA, HTN (mom, dad, sister) ; She has no chest  pain or report of exercise intolerance.   LUNG CANCER RISK ASSESSMENT: She is a non smoker.  TOBACCO/ALCOHOL: 2-3 drinks two times a week.   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 Dr. Merced sees her.   OPHTHO & DENTAL HEALTH: Annual ophtho (cataracts- removed a few years ago- going back to see Dr. Starla) and dental appointments.  IMMUNIZATIONS:  Influenza and covid Fall, 2024  \  Review of Systems   Constitutional: Negative.    HENT: Negative.     Eyes: Negative.    Respiratory: Negative.     Cardiovascular: Negative.    Gastrointestinal: Negative.    Endocrine: Negative.    Genitourinary: Negative.    Musculoskeletal:  Positive for myalgias (RLS symptoms).   Skin: Negative.    Allergic/Immunologic: Negative.    Neurological: Negative.    Hematological: Negative.    Psychiatric/Behavioral:  Positive for sleep disturbance.    All other systems reviewed and are negative.      Objective     BP 110/72 (BP Site: Right arm, Patient Position: Sitting, Cuff Size: Large)   Pulse 90   Temp 99.1 F (37.3 C)   Ht 1.676 m (5' 6)   Wt 85.7 kg (189 lb)   LMP  (  LMP Unknown)   SpO2 99%   BMI 30.51 kg/m   Physical Exam  Vitals and nursing note reviewed. Exam conducted with a chaperone present.   Constitutional:       General: She is not in acute distress.     Appearance: Normal appearance. She is normal weight. She is not ill-appearing or toxic-appearing.   HENT:      Head: Normocephalic and atraumatic.      Right Ear: Tympanic membrane, ear canal and external ear normal. There is no impacted cerumen.      Left Ear: Tympanic membrane, ear canal and external ear normal. There is no impacted cerumen.      Nose: Nose normal. No congestion or rhinorrhea.      Mouth/Throat:      Mouth: Mucous membranes are dry.      Pharynx: Oropharynx is  clear. No oropharyngeal exudate or posterior oropharyngeal erythema.   Eyes:      General: No scleral icterus.     Extraocular Movements: Extraocular movements intact.      Conjunctiva/sclera: Conjunctivae normal.      Pupils: Pupils are equal, round, and reactive to light.   Cardiovascular:      Rate and Rhythm: Normal rate and regular rhythm.      Pulses: Normal pulses.      Heart sounds: Normal heart sounds. No murmur heard.     No gallop.   Pulmonary:      Effort: Pulmonary effort is normal. No respiratory distress.      Breath sounds: Normal breath sounds. No wheezing or rhonchi.   Abdominal:      General: Abdomen is flat. Bowel sounds are normal. There is no distension.      Palpations: Abdomen is soft.      Tenderness: There is no abdominal tenderness. There is no rebound.   Genitourinary:     Exam position: Supine.      Pubic Area: No rash.       Tanner stage (genital): 5.      Labia:         Right: No tenderness or lesion.         Left: No tenderness or lesion.       Urethra: No prolapse, urethral pain, urethral swelling or urethral lesion.   Musculoskeletal:         General: No swelling or tenderness. Normal range of motion.      Cervical back: Normal range of motion and neck supple. No rigidity. No muscular tenderness.      Right lower leg: No edema.      Left lower leg: No edema.   Lymphadenopathy:      Cervical: No cervical adenopathy.      Lower Body: No right inguinal adenopathy. No left inguinal adenopathy.   Skin:     General: Skin is warm.      Capillary Refill: Capillary refill takes less than 2 seconds.      Findings: No rash.   Neurological:      General: No focal deficit present.      Mental Status: She is alert and oriented to person, place, and time. Mental status is at baseline.      Cranial Nerves: No cranial nerve deficit.      Motor: No weakness.      Gait: Gait normal.   Psychiatric:         Mood and Affect: Mood normal.         Behavior:  Behavior normal.         Thought Content: Thought  content normal.         Judgment: Judgment normal.       Physical Exam  VITALS: P- 90, BP- 110/72  MEASUREMENTS: Weight- 189, BMI- 30.0.     Results  RADIOLOGY  Coronary calcium  score: 167, 93% occlusion of left anterior descending artery (05/04/2023)    DIAGNOSTIC  Colonoscopy: Three polyps removed, internal hemorrhoids (03/10/2022)    PATHOLOGY  Pathology report: Unspecified polyp type (03/10/2022)      Assessment/Plan     Assessment & Plan    General Health Maintenance    She is due for health maintenance screenings and vaccinations. A 3D mammogram and bilateral breast ultrasound are ordered due to breast augmentation history. Administer pneumonia vaccination today and recommend annual flu and COVID vaccinations. Schedule the next colonoscopy for October 2028.    Health Maintenance Reminder/AVS provided to pt  Importance of tobacco, excessive alcohol, illicit drug use reviewed  Sunscreen encouraged when exposed to sun, to reduce skin cancer risk  Seat belt use encouraged  Encouraged routine eye exam thru Ophthalmology or Optometry  Encouraged routine dental exams, cleaning 2x/yr  Importance of moderate exercise at least 5x/week and a low fat, high fiber diet that includes 5 servings of fruits and vegetables a day is discussed  HM labs as ordered. Offered STD screening: declined by pt in monogamous low risk relationship.    Patient to follow up with GYN for routine Pap smear for Cervical cancer screening, as desired by pt    Routine mammogram screening ordered    Screening colonoscopy UTD  Patient had colonoscopy 03/2022 with Dr. Dale Coria. Had polypectomy - recommend repeat screening 2028.    Routine immunizations -due for PNA 20- given today.    Next well exam in 1 year    Coronary Artery Disease    Coronary artery disease with significant occlusion is managed with statin therapy. Consider GLP-1 therapy for weight management. Resubmit prior authorization for tirzepatide, including details on coronary artery  disease diagnosis and high cholesterol. Continue statin therapy and encourage weight loss.    Obesity    With a BMI of 30, she seeks weight loss treatment. Previous GLP-1 therapy faced insurance issues. Resubmit prior authorization for tirzepatide, including information on obesity and coronary artery disease. Encourage weight loss and healthy lifestyle changes.    Hyperlipidemia    High cholesterol is managed with statin therapy. A lipoprotein A test is ordered to assess additional risk factors. Continue statin therapy.    Restless Leg Syndrome    She experiences restless leg syndrome and uses diazepam  rarely due to side effects. Advised against regular diazepam  use. Consider magnesium  glycinate for muscle relaxation.    Insomnia    She experiences insomnia and occasionally uses Ambien . Advised against regular Ambien  use, and trazodone is suggested as a safer alternative. Prescribe trazodone 50 mg, with the option to increase to 100 mg if needed. Discontinue Ambien  CR.    Follow-up    Follow-up plans include blood work and specialist consultations. Order blood work including lipoprotein A, A1c, thyroid, cholesterol, liver, kidney, and blood count tests. Schedule a follow-up with a cardiologist for an echocardiogram before September 22. Provide a list of recommended gynecologists and encourage regular eye and dental check-ups.    Verbal consent obtained to record this visit.     FOLLOW UP: to discuss any abnormal labs & studies / review treatment options as needed.    *  Patient advised to visit MY CHART for results within a week of the lab visit. Encouraged to contact the office if results not reported within a week.    Josetta Bush, MD

## 2023-11-12 NOTE — Telephone Encounter (Signed)
 Medication Name and Strength: Zepbound 2.5MG /0.5ML pen-injectors   DENIED    Denial letter placed in Team b scan bin for upload to chart.

## 2023-11-13 ENCOUNTER — Encounter (INDEPENDENT_AMBULATORY_CARE_PROVIDER_SITE_OTHER): Payer: Self-pay | Admitting: Family Medicine

## 2023-11-13 DIAGNOSIS — E7800 Pure hypercholesterolemia, unspecified: Secondary | ICD-10-CM

## 2023-11-13 DIAGNOSIS — I2510 Atherosclerotic heart disease of native coronary artery without angina pectoris: Secondary | ICD-10-CM

## 2023-11-13 LAB — LIPOPROTEIN A (LPA): Lipoprotein (a): 13 nmol/L (ref <75)

## 2023-11-15 ENCOUNTER — Encounter (INDEPENDENT_AMBULATORY_CARE_PROVIDER_SITE_OTHER): Payer: Self-pay

## 2023-11-16 ENCOUNTER — Telehealth (INDEPENDENT_AMBULATORY_CARE_PROVIDER_SITE_OTHER): Payer: Self-pay | Admitting: Family Medicine

## 2023-11-16 NOTE — Telephone Encounter (Signed)
 The other medications she has to have tried and failed is Saxenda AND Wegovy .    I know she has tried Wegovy  but I do not see Saxenda.     One of the questions is: We would like to advise you of alternative formulary products for your patient. The products are Saxenda and Wegovy . Would you like to switch the patient to Saxenda or Wegovy ?

## 2023-11-16 NOTE — Telephone Encounter (Addendum)
 I see denial letter. Will call with phone number listed today.    Dr. Glendia

## 2023-11-16 NOTE — Telephone Encounter (Addendum)
 Good Morning Malinda,  I want to send a challenge to the denial of patient's GLP 1 therapy.    She has KNOWN coronary heart disease, confirmed after Dr. Jeanelle sent her for coronary artery calcium  score in 04/2022.    She also cannot take the oral medications that are on the formulary.  She has contraindications taking stimulants like phentermine  with her known heart disease. She has had episodes of paroxysmal a fib, also making a stimulant contraindicated for her.    I think she should be a candidate for GLP 1 therapy based on her KNOWN heart disease risk.    Can I write a letter or do a peer to peer?    I think with a little effort I can get this through. Who do I call? How do I initiate an appeal?    Dr. Glendia

## 2023-11-30 ENCOUNTER — Encounter (INDEPENDENT_AMBULATORY_CARE_PROVIDER_SITE_OTHER): Payer: Self-pay | Admitting: Family Medicine

## 2023-11-30 DIAGNOSIS — Z6830 Body mass index (BMI) 30.0-30.9, adult: Secondary | ICD-10-CM

## 2023-11-30 DIAGNOSIS — I2510 Atherosclerotic heart disease of native coronary artery without angina pectoris: Secondary | ICD-10-CM

## 2023-11-30 DIAGNOSIS — E7800 Pure hypercholesterolemia, unspecified: Secondary | ICD-10-CM

## 2023-11-30 MED ORDER — SEMAGLUTIDE-WEIGHT MANAGEMENT 0.25 MG/0.5ML SC SOAJ
0.2500 mg | SUBCUTANEOUS | 0 refills | Status: DC
Start: 2023-11-30 — End: 2023-12-14

## 2023-12-01 ENCOUNTER — Telehealth (INDEPENDENT_AMBULATORY_CARE_PROVIDER_SITE_OTHER): Payer: Self-pay | Admitting: Family Medicine

## 2023-12-01 DIAGNOSIS — Z6830 Body mass index (BMI) 30.0-30.9, adult: Secondary | ICD-10-CM

## 2023-12-01 NOTE — Progress Notes (Signed)
 Hi Malinda,    Good morning!  Any updates on the Wegovy ?     Thank you!

## 2023-12-01 NOTE — Telephone Encounter (Signed)
 Pls see Dr glendia message

## 2023-12-01 NOTE — Telephone Encounter (Signed)
 Nurses:  Form signed and placed in the Team B black file tower.  Dr. Glendia

## 2023-12-01 NOTE — Telephone Encounter (Signed)
 Malinda,  I actually called and spoke with [employer]EP [employer] my self  and explained that [place] has:    -Known coronary artery disease I25.10, I25.84  -Thoracic Aortic Aneurysm I71.20  -Paroxysmal Atrial Fibrillation I48.91    In [employer] Media there is an approval letter??    Help,  TY,  Dr. Glendia

## 2023-12-02 NOTE — Telephone Encounter (Signed)
 Faxed to [facility] on fax # [phone]     WHAT WAS FAXED:  - Order # Tier Exception Member request form     Confirmation Sheet - yes on 12/02/23 at 1711 hrs    Placed confirmation sheet in the scan bin on Team a side.

## 2023-12-03 ENCOUNTER — Ambulatory Visit (INDEPENDENT_AMBULATORY_CARE_PROVIDER_SITE_OTHER): Payer: Self-pay | Admitting: Family Medicine

## 2023-12-03 DIAGNOSIS — R7989 Other specified abnormal findings of blood chemistry: Secondary | ICD-10-CM

## 2023-12-03 NOTE — Telephone Encounter (Signed)
 Faxed to [facility] on fax # [phone]     WHAT WAS FAXED:  - Order # Tier Exception Member request form     Confirmation Sheet - yes on 12/02/23 at 1711 hrs    Placed confirmation sheet in the scan bin on Team a side.

## 2023-12-03 NOTE — Progress Notes (Signed)
 NURSES:  Please review results and rec's. Encourage follow up LFTs/fasting labs.  Ty,  Dr. Glendia Richard Morning Leita,  Here is a summary of your labs:    -Your blood glucose was normal- no signs of diabetes type 2.  -Your kidney function testing was normal.  -Your electrolytes were all normal.    Your liver enzymes were slightly elevated.    There are so many things that can cause slight elevations in your liver enzymes, from taking tylenol  the day before your blood draw, alcohol intake or ingestion of too many sugars and refined carbohydrates. Sometimes this elevation can be a sign of liver inflammation from a virus. I recommend we repeat your liver enzymes in a month to make sure they have returned to normal.  Please follow up for a nurse's visit in ONE MONTH to repeat your liver enzymes. I have placed an order on the chart for this to happen.    If your numbers remain elevated, you should follow up with the doctors at [facility] for further investigation.    You will see a slight elevation in your LDL 'bad' cholesterol. You may want to reduce your intake of saturated fats, fried foods, full fat dairy products and oils to help bring this number down. Dietary changes and increased exercise can help lower your LDL.    Keep taking your rosuvastatin .  Both your LDL and triglycerides have dramatically improved compared to 3 years ago! Good work.    Your lipoprotein A test was negative/normal range. You do not have lipoprotein A.    Your A1C test, an average of your blood sugar over 3 months, was normal. No signs of type 2 diabetes.    Your thyroid function testing showed no evidence of thyroid disease. Your thyroid function testing was normal.     Your CBC/blood count was normal.  No signs of anemia or blood disorders.    Medrith your labs were for the most part ok. I do want you to follow up re; your elevated liver function to make sure all is well.    An order has been placed on your chart. Please  schedule follow up labs and follow up with the doctors if they remain high.    Please contact me if you have questions about your results. Extensive questions may require a follow up visit.  Dr. Glendia

## 2023-12-13 NOTE — Telephone Encounter (Signed)
 Tier Exception was denied. See Correspondence from 12/06/2023 under media tab/

## 2023-12-14 ENCOUNTER — Encounter (INDEPENDENT_AMBULATORY_CARE_PROVIDER_SITE_OTHER): Payer: Self-pay | Admitting: Student

## 2023-12-14 ENCOUNTER — Ambulatory Visit (INDEPENDENT_AMBULATORY_CARE_PROVIDER_SITE_OTHER): Payer: BLUE CROSS/BLUE SHIELD | Admitting: Student

## 2023-12-14 VITALS — BP 116/82 | HR 77 | Temp 98.4°F | Ht 65.5 in | Wt 189.0 lb

## 2023-12-14 DIAGNOSIS — E7800 Pure hypercholesterolemia, unspecified: Secondary | ICD-10-CM

## 2023-12-14 DIAGNOSIS — R7989 Other specified abnormal findings of blood chemistry: Secondary | ICD-10-CM

## 2023-12-14 DIAGNOSIS — E6609 Other obesity due to excess calories: Secondary | ICD-10-CM

## 2023-12-14 DIAGNOSIS — Z6830 Body mass index (BMI) 30.0-30.9, adult: Secondary | ICD-10-CM

## 2023-12-14 DIAGNOSIS — I2510 Atherosclerotic heart disease of native coronary artery without angina pectoris: Secondary | ICD-10-CM

## 2023-12-14 DIAGNOSIS — I2584 Coronary atherosclerosis due to calcified coronary lesion: Secondary | ICD-10-CM

## 2023-12-14 DIAGNOSIS — E66811 Obesity, class 1: Secondary | ICD-10-CM

## 2023-12-14 MED ORDER — SEMAGLUTIDE-WEIGHT MANAGEMENT 2.4 MG/0.75ML SC SOAJ
2.4000 mg | SUBCUTANEOUS | 2 refills | Status: DC
Start: 2023-12-14 — End: 2024-02-29

## 2023-12-14 NOTE — Progress Notes (Unsigned)
 [facility] - [facility]     {Vanishing Tip Click a link below to be taken to that activity or part of the chart   Chart Review  Order Review  Review Flowsheets  Labs  Health Maintenance  Immunizations  Allergies  Medications  Problem List  History  Synopsis :55325}      Subjective     Chief Complaint   Patient presents with    Medication Review     Wegovy  f/u     History of Present Illness  Melinda Brown is a 63 year old female with coronary artery disease and hyperlipidemia who presents for medication management and follow-up.    Weight management and pharmacotherapy  - Currently using Wegovy  for weight loss, paying out-of-pocket due to insurance complications - has Ecologist Form for signature (for Teir 2 not Tier 3)  - Prefers Zepbound over Wegovy  due to better weight loss results, but insurance coverage is a barrier  - Previously trialed metformin, Topamax, and Orlistat (N/V/D), Phentermine  (Afib/insomnia - SE) for weight loss  - Experienced insomnia with amphetamine and gastrointestinal issues with orlistat  - No weight loss with Metformin or Topamax   - Started Ozempic  in 2023, initially covered by insurance, later switched to Wegovy  due to insurance issues  - No nausea or vomiting, constipation, abdominal pain with Wegovy   - Occasional acid reflux, especially with tomato-based foods    Dyslipidemia and statin therapy  CAD   - Currently taking Crestor  10 mg, tolerating well   - LDL cholesterol slightly elevated (104) June 2024  - Previously taking 5 mg daily  - No significant side effects from current medications    Hepatic enzyme elevation  - Elevated liver enzymes in June 2025: AST 49, ALT 60  - Attributes elevation to alcohol consumption the night before testing    Coronary artery disease and cardiac evaluation  - History of coronary artery disease  - CT scan in November 2023 revealed elevated calcium  score     Lifestyle and psychosocial  factors  - [employer] employee experiencing job-related stress  - Engages in physical activity, including golf  - Maintains a high-protein diet with salads  - Recent emotional distress due to the loss of two pets    Review of Systems    Objective   BP 116/82 (BP Site: Left arm, Patient Position: Sitting, Cuff Size: Large)   Pulse 77   Temp 98.4 F (36.9 C) (Tympanic)   Ht 1.664 m (5' 5.5)   Wt 85.7 kg (189 lb)   LMP  (LMP Unknown)   SpO2 99%   BMI 30.97 kg/m   Physical Exam  Physical Exam  Physical Exam  Vitals reviewed.   Constitutional:       Appearance: Normal appearance.   HENT:      Head: Normocephalic.      Nose: Nose normal.      Mouth/Throat:      Mouth: Mucous membranes are moist.      Pharynx: Oropharynx is clear.   Eyes:      Extraocular Movements: Extraocular movements intact.      Conjunctiva/sclera: Conjunctivae normal.      Pupils: Pupils are equal, round, and reactive to light.   Cardiovascular:      Rate and Rhythm: Normal rate and regular rhythm.   Pulmonary:      Effort: Pulmonary effort is normal.      Breath sounds: Normal breath sounds.  Abdominal:      General: Abdomen is flat. Bowel sounds are normal.      Palpations: Abdomen is soft.   Musculoskeletal:         General: Normal range of motion.      Cervical back: Normal range of motion and neck supple.   Skin:     General: Skin is warm and dry.   Neurological:      General: No focal deficit present.      Mental Status: He is alert.   Psychiatric:         Mood and Affect: Mood normal.         Behavior: Behavior normal.         Thought Content: Thought content normal.         Judgment: Judgment normal.           Results        Assessment/Plan     Assessment & Plan  Obesity  She has been on various weight loss medications, including Ozempic  and Wegovy , with insurance coverage issues affecting continuity. Prefers Zepbound due to better weight loss outcomes but faces insurance challenges. Adverse reactions to other weight loss medications  include insomnia with amphetamine and gastrointestinal issues with orlistat. Coronary artery disease complicates the use of certain medications. Currently on a dose of 2.4 mg of Wegovy  and is tolerating it well, with some acid reflux as a side effect. Discussed the risk of pancreatitis and gallbladder issues with Wegovy . Zepbound is preferred due to more significant weight loss outcomes, but insurance coverage is a barrier. Prior authorization team will be consulted to resolve insurance issues.  - Tier Exception Member Request Form signed 12/01/23 already by Dr. Glendia but denied.   - Reviewed form and Dr. Glendia did note adverse reaction to both stimulant (phentermine ) and non stimulant (orlistat).   - If patient prefers out of pocket ok medically to maintain current dose of Wegovy  at 2.4 mg.  - Monitor for severe abdominal pain indicating pancreatitis.  - Advise her to report any gallbladder-related pain.  - Encourage continuation of healthy diet and exercise.    Coronary Artery Disease  Coronary artery disease influences the choice of weight loss medications. Cautious about using medications that may exacerbate cardiac issues. Avoid use of amphetamine due to cardiac history.  - Avoid use of stimulant due to cardiac history.    Elevated Liver Enzymes  Liver enzymes were slightly elevated during recent blood work, with AST at 49 and ALT at 60. Elevation may be due to alcohol consumption prior to the test. Plan to recheck liver enzymes to rule out any significant liver issues, such as fatty liver infiltration, especially given statin use. Statins are metabolized through the liver, necessitating monitoring of liver function tests.  - Order repeat liver function tests.  - Consider liver ultrasound if liver enzymes remain elevated.    Hyperlipidemia  She is currently on Crestor  10 mg. Initially had difficulty tolerating the 10 mg dose but is now managing well. Plan to monitor liver enzymes due to statin use. Statins are  metabolized through the liver, necessitating monitoring of liver function tests.  - Continue Crestor  10 mg daily.  - Monitor liver function tests for any changes due to statin use.    General Health Maintenance  Due for a colonoscopy in October 2026, following the discovery of two polyps and internal hemorrhoids in a previous colonoscopy. Advised that she can have the procedure earlier if she notices any  concerning symptoms.  - Schedule colonoscopy for October 2026.  - Advise her to report any blood in stool or concerning symptoms.    Follow-up  Plan to follow up in three months to monitor weight management and overall health, ensuring no rapid weight loss or muscle wasting occurs. Monitoring weight loss is crucial to prevent muscle wasting and ensure healthy weight management.  - Schedule follow-up appointment in three months.    Verbal consent obtained to record this visit.

## 2023-12-15 NOTE — Telephone Encounter (Signed)
 Faxed to [employer] on fax # [phone]     WHAT WAS FAXED:  - Order # [place] Appeal    Confirmation Sheet - OK on 12/15/23 at 1006 hrs    Placed confirmation sheet in the scan bin.

## 2023-12-16 ENCOUNTER — Telehealth (INDEPENDENT_AMBULATORY_CARE_PROVIDER_SITE_OTHER): Payer: Self-pay | Admitting: Student

## 2023-12-16 ENCOUNTER — Other Ambulatory Visit: Payer: Self-pay | Admitting: Family Medicine

## 2023-12-16 ENCOUNTER — Other Ambulatory Visit: Payer: Self-pay | Admitting: Student

## 2023-12-16 ENCOUNTER — Encounter (INDEPENDENT_AMBULATORY_CARE_PROVIDER_SITE_OTHER): Payer: Self-pay | Admitting: Student

## 2023-12-16 DIAGNOSIS — Z1239 Encounter for other screening for malignant neoplasm of breast: Secondary | ICD-10-CM

## 2023-12-16 NOTE — Telephone Encounter (Signed)
 [facility] is requesting a change to the US  order. Needs to say US  breast bilateral complete not limited. Pt is at [facility] right now. Asking for this to be expedited

## 2023-12-17 ENCOUNTER — Encounter (INDEPENDENT_AMBULATORY_CARE_PROVIDER_SITE_OTHER): Payer: Self-pay

## 2023-12-17 ENCOUNTER — Other Ambulatory Visit (INDEPENDENT_AMBULATORY_CARE_PROVIDER_SITE_OTHER): Payer: BLUE CROSS/BLUE SHIELD

## 2023-12-17 ENCOUNTER — Ambulatory Visit (INDEPENDENT_AMBULATORY_CARE_PROVIDER_SITE_OTHER): Payer: Self-pay | Admitting: Student

## 2023-12-17 ENCOUNTER — Other Ambulatory Visit (INDEPENDENT_AMBULATORY_CARE_PROVIDER_SITE_OTHER): Payer: Self-pay | Admitting: Family Medicine

## 2023-12-18 LAB — HEPATIC FUNCTION PANEL (LFT): Bilirubin Direct: 0.25 mg/dL (ref 0.00–0.40)

## 2023-12-20 ENCOUNTER — Encounter (INDEPENDENT_AMBULATORY_CARE_PROVIDER_SITE_OTHER): Payer: Self-pay | Admitting: Student

## 2023-12-20 DIAGNOSIS — R7989 Other specified abnormal findings of blood chemistry: Secondary | ICD-10-CM

## 2024-01-12 ENCOUNTER — Encounter (INDEPENDENT_AMBULATORY_CARE_PROVIDER_SITE_OTHER): Payer: Self-pay | Admitting: Student

## 2024-01-16 ENCOUNTER — Other Ambulatory Visit (INDEPENDENT_AMBULATORY_CARE_PROVIDER_SITE_OTHER): Payer: Self-pay

## 2024-01-16 DIAGNOSIS — G4700 Insomnia, unspecified: Secondary | ICD-10-CM

## 2024-01-17 ENCOUNTER — Encounter (INDEPENDENT_AMBULATORY_CARE_PROVIDER_SITE_OTHER): Payer: Self-pay | Admitting: Student

## 2024-01-17 DIAGNOSIS — F5104 Psychophysiologic insomnia: Secondary | ICD-10-CM

## 2024-01-19 MED ORDER — ZOLPIDEM TARTRATE ER 12.5 MG PO TBCR
12.5000 mg | EXTENDED_RELEASE_TABLET | Freq: Every evening | ORAL | 0 refills | Status: AC | PRN
Start: 2024-01-19 — End: ?

## 2024-01-22 ENCOUNTER — Other Ambulatory Visit (INDEPENDENT_AMBULATORY_CARE_PROVIDER_SITE_OTHER): Payer: Self-pay

## 2024-01-22 DIAGNOSIS — F5104 Psychophysiologic insomnia: Secondary | ICD-10-CM

## 2024-01-31 ENCOUNTER — Telehealth: Payer: Self-pay | Admitting: Family Medicine

## 2024-01-31 NOTE — Telephone Encounter (Signed)
 Copied from CRM #[id number]. Topic: General - Deceased Patient >> 2024/02/21  9:49 AM Logan F wrote: Name of caller: Medford Bunde (son) [phone]  Date of death: 2024-02-20   Name of funeral home: [employer] number of funeral home: [phone]  Provider that needs to sign form: Dr Ziglar  Timeline for signing: Looking for form to be completed for cause of death and a death certificate to release body to funeral home

## 2024-02-01 NOTE — Telephone Encounter (Signed)
 Copied from CRM #[id number]. Topic: General - Other >> Feb 01, 2024 10:48 AM Cleave MATSU wrote: Reason for CRM: chris just returned call please follow back up with him

## 2024-02-01 NOTE — Telephone Encounter (Signed)
 Copied from CRM #[id number]. Topic: General - Other >> Jan 31, 2024  3:34 PM Delon DASEN wrote: Reason for CRM: Son Medford [phone]- questions about autopsy and finding out cause of death

## 2024-02-07 DEATH — deceased

## 2024-02-26 ENCOUNTER — Other Ambulatory Visit (INDEPENDENT_AMBULATORY_CARE_PROVIDER_SITE_OTHER): Payer: Self-pay | Admitting: Student

## 2024-02-26 DIAGNOSIS — E7800 Pure hypercholesterolemia, unspecified: Secondary | ICD-10-CM

## 2024-02-26 DIAGNOSIS — Z6830 Body mass index (BMI) 30.0-30.9, adult: Secondary | ICD-10-CM

## 2024-02-26 DIAGNOSIS — I2584 Coronary atherosclerosis due to calcified coronary lesion: Secondary | ICD-10-CM

## 2024-02-29 ENCOUNTER — Ambulatory Visit: Payer: Medicaid Other | Admitting: Dermatology

## 2024-05-19 ENCOUNTER — Other Ambulatory Visit (INDEPENDENT_AMBULATORY_CARE_PROVIDER_SITE_OTHER): Payer: Self-pay | Admitting: Student

## 2024-05-19 DIAGNOSIS — E7800 Pure hypercholesterolemia, unspecified: Secondary | ICD-10-CM

## 2024-05-19 DIAGNOSIS — Z6830 Body mass index (BMI) 30.0-30.9, adult: Secondary | ICD-10-CM

## 2024-05-19 DIAGNOSIS — I2510 Atherosclerotic heart disease of native coronary artery without angina pectoris: Secondary | ICD-10-CM

## 2024-05-22 NOTE — Telephone Encounter (Signed)
 Lmtcb and schedule

## 2024-05-22 NOTE — Telephone Encounter (Addendum)
 FIRST COURTESY    Patient does not have an upcoming appointment. PHONE TEAM - please call patient to help schedule for chronic care, weight check.    Medication: Wegovy  2.4  Pended: Qty 3mL Refills 0  Last Addressed: Dec 14 2023    Recent Visits  Date Type Provider Dept   12/14/23 Office Visit Argentina Early, MD [facility]   07/13/23 Office Visit Hewlett, Rosaline ORN, FNP [facility]   05/20/23 Office Visit Argentina Early, MD [facility]   04/20/23 Office Visit Argentina Early, MD [facility]   Showing recent visits within past 540 days with a meds authorizing provider and meeting all other requirements  Future Appointments  No visits were found meeting these conditions.  Showing future appointments within next 150 days with a meds authorizing provider and meeting all other requirements

## 2024-05-24 NOTE — Telephone Encounter (Signed)
 Patient due for 6 month follow up for GLP-1 agonist therapy monitoring. Placed bridge 1 month refill Wegovy  2.4 mg SQ weekly.     Melinda Brown, M.D.  [facility]

## 2024-05-24 NOTE — Telephone Encounter (Signed)
Scheduled for 1/6.

## 2024-05-26 ENCOUNTER — Other Ambulatory Visit (INDEPENDENT_AMBULATORY_CARE_PROVIDER_SITE_OTHER): Payer: Self-pay | Admitting: Student

## 2024-05-26 DIAGNOSIS — I2510 Atherosclerotic heart disease of native coronary artery without angina pectoris: Secondary | ICD-10-CM

## 2024-05-26 DIAGNOSIS — E7800 Pure hypercholesterolemia, unspecified: Secondary | ICD-10-CM

## 2024-05-26 DIAGNOSIS — E66811 Obesity, class 1: Secondary | ICD-10-CM

## 2024-05-26 NOTE — Telephone Encounter (Signed)
 Requested Prescriptions     Pending Prescriptions Disp Refills    Wegovy  2.4 MG/0.75ML injection [Pharmacy Med Name: Wegovy  2.4 Mg/0.75ml Inj Novo] 3 mL 0     Sig: Inject 0.75 mLs (2.4 mg) into the skin once a week         Patient has an upcoming visit on 06/13/2024

## 2024-05-29 ENCOUNTER — Other Ambulatory Visit (INDEPENDENT_AMBULATORY_CARE_PROVIDER_SITE_OTHER): Payer: Self-pay | Admitting: Student

## 2024-05-29 DIAGNOSIS — E7800 Pure hypercholesterolemia, unspecified: Secondary | ICD-10-CM

## 2024-05-29 DIAGNOSIS — I2510 Atherosclerotic heart disease of native coronary artery without angina pectoris: Secondary | ICD-10-CM

## 2024-05-29 DIAGNOSIS — E6609 Other obesity due to excess calories: Secondary | ICD-10-CM

## 2024-05-30 ENCOUNTER — Telehealth (INDEPENDENT_AMBULATORY_CARE_PROVIDER_SITE_OTHER): Payer: Self-pay

## 2024-05-30 ENCOUNTER — Encounter (INDEPENDENT_AMBULATORY_CARE_PROVIDER_SITE_OTHER): Payer: Self-pay | Admitting: Student

## 2024-05-30 NOTE — Telephone Encounter (Signed)
 Wegovy  2.4MG /0.75ML auto-injectors  Key: [id number]  Rx #: [id number]    One of the prior authorization questions is: Has the patient lost at least 5 percent of their baseline body weight?    Dr Argentina - please advise. I will need to answer No unless we wait until patient is seen on 06/13/2024 and we see if her weight has decreased. If there is no 5% weight loss documentation the prior authorization will get denied.     I have even gone back to prior to Ozempic  therapy.    Weight Documentation:  12/14/2023 - 189lbs  11/10/2023 - 189lbs  07/13/2023 - 184lbs 6.4oz  05/20/2023 - 184lbs  04/20/2023 - 181lbs 6.4oz  03/10/2023 - 184lbs  10/08/2022 - 185lbs  07/29/2022 - 191lbs  03/30/2022 - 182lbs  10/20/2021 - 190lbs 9.6oz - started Ozempic  therapy  06/04/2021 - 189lbs

## 2024-06-13 ENCOUNTER — Encounter (INDEPENDENT_AMBULATORY_CARE_PROVIDER_SITE_OTHER): Payer: Self-pay | Admitting: Student

## 2024-06-13 ENCOUNTER — Ambulatory Visit (INDEPENDENT_AMBULATORY_CARE_PROVIDER_SITE_OTHER): Payer: BLUE CROSS/BLUE SHIELD | Admitting: Student

## 2024-06-13 VITALS — BP 126/82 | HR 96 | Temp 98.3°F | Ht 66.0 in | Wt 186.0 lb

## 2024-06-13 DIAGNOSIS — E66811 Obesity, class 1: Secondary | ICD-10-CM

## 2024-06-13 DIAGNOSIS — Z6830 Body mass index (BMI) 30.0-30.9, adult: Secondary | ICD-10-CM

## 2024-06-13 DIAGNOSIS — G4733 Obstructive sleep apnea (adult) (pediatric): Secondary | ICD-10-CM

## 2024-06-13 DIAGNOSIS — I2510 Atherosclerotic heart disease of native coronary artery without angina pectoris: Secondary | ICD-10-CM

## 2024-06-13 DIAGNOSIS — E7800 Pure hypercholesterolemia, unspecified: Secondary | ICD-10-CM

## 2024-06-13 DIAGNOSIS — R7989 Other specified abnormal findings of blood chemistry: Secondary | ICD-10-CM

## 2024-06-13 DIAGNOSIS — I2584 Coronary atherosclerosis due to calcified coronary lesion: Secondary | ICD-10-CM

## 2024-06-13 DIAGNOSIS — E6609 Other obesity due to excess calories: Secondary | ICD-10-CM

## 2024-06-13 MED ORDER — SEMAGLUTIDE-WEIGHT MANAGEMENT 2.4 MG/0.75ML SC SOAJ: 2.4000 mg | SUBCUTANEOUS | 3 refills | Status: DC

## 2024-06-13 NOTE — Progress Notes (Signed)
 [facility] - A FOUNDING MEMBER OF [facility]           Subjective     Chief Complaint   Patient presents with    Medication Management     History of Present Illness  Mariaha Brown is a 64 year old female with coronary artery disease who presents for a follow-up on weight management and medication review.    Weight management  - Current weight is 186 pounds, down from a starting weight of 215 pounds.  - Experienced fatigue as a side effect of Wegovy  2.5 mg sq weekly   - Experiencing weight plateau   - No severe side effects from GLP-1 agonists other than fatigue.  - No family history of endocrine cancers, thyroid  cancers, or pancreatitis.  Wt Readings from Last 3 Encounters:   06/13/24 84.4 kg (186 lb)   12/14/23 85.7 kg (189 lb)   11/10/23 85.7 kg (189 lb)     Coronary artery disease and cardiac history  - History of coronary artery disease, currently following with Cardiology   - History of arrhythmia and atrial fibrillation, which preclude use of stimulants.    Liver function abnormalities  - Elevated liver enzymes with AST at 45 and ALT at 60.  - CMP ordered by Cardiology     Medication intolerance  - Felt ill after trying Orilissa stat in the past.    Cholesterol management  - Currently taking Crestor  10 mg for cholesterol control along with Zetia 10 mg daily    Lifestyle modification  - Follows a lower-carbohydrate diet.  - Recently started working out with a trainer to improve balance and flexibility.    Preventive health maintenance  - Received influenza and COVID vaccinations.  - Plans to schedule a colonoscopy, which is due this year.  Review of Systems    Objective   BP 126/82 (BP Site: Right arm, Patient Position: Sitting, Cuff Size: Large)   Pulse 96   Temp 98.3 F (36.8 C) (Tympanic)   Ht 1.676 m (5' 6)   Wt 84.4 kg (186 lb)   LMP  (LMP Unknown)   SpO2 95%   BMI 30.02 kg/m   Physical Exam  Physical Exam  GEN: Alert, well appearing, in no acute distress  Head: normocephalic,  atraumatic  Eyes: EOMI, PERRL, sclera non-icteric  ENT: oropharynx clear, moist mucous membranes  Neck: supple, no cervical LAD, no thyromegaly  CV: regular rate and rhythm, no murmur or gallop, 2+ peripheral pulses  Pulm: normal respiratory effort on room air, clear to auscultation bilaterally, no wheezes or crackles  Abd: soft, non-distended, non-tender  Ext: no edema, no joint swelling  Skin: skin is warm, no rashes  Neuro: face symmetric, moving all extremities, no gross deficits     Results        Assessment/Plan     Assessment & Plan  Class 1 obesity  Current weight of 186 pounds, down from 215 pounds. Weight loss has plateaued with Wegovy , and she experiences fatigue as a side effect. Considering switching to Zepbound  (tirzepatide ) for potentially better efficacy in weight loss. No family history of endocrine cancers or pancreatitis. No thyroid  or gallbladder issues reported.  - Continue Wegovy  2.4 mg subcutaneously weekly.  - Patient plans to discuss with insurance about potential transition to Zepbound .  - Defers initiation of Zepbound  today given concern for potential gap in GLP1 agonist.  - Document plateau in weight loss and fatigue as reasons for considering switch to Zepbound .  -  Monitor for any signs of thyroid  issues, pancreatitis, or gallbladder dysfunction.    Pure hypercholesterolemia  Managed with Crestor  10 mg. Liver enzymes (AST and ALT) were slightly elevated at 45 and 60, possibly due to Crestor , infection, or fatty liver.  - Ordered comprehensive metabolic panel (CMP) to check liver enzymes.  - If liver enzymes remain elevated, will consider right upper quadrant ultrasound to assess for fatty liver.  - Continue Crestor  10 mg daily, Zetia 10 mg daily   - Complete lipid panel ordered by Cardiology for LDL goal <55 given CAD     Coronary artery disease  Managed by cardiologist Dr. Laverna. No acute issues reported.  - Continue follow-up with cardiologist Dr. Laverna.    Obstructive sleep  apnea  Obstructive sleep apnea managed with CPAP. Following with Cardiology.     General health maintenance  She has received flu and COVID vaccinations. Colonoscopy is due this year.  - Schedule colonoscopy with GI specialist.      Verbal consent obtained to record this visit.

## 2024-06-14 ENCOUNTER — Encounter (INDEPENDENT_AMBULATORY_CARE_PROVIDER_SITE_OTHER): Payer: Self-pay | Admitting: Student

## 2024-06-14 DIAGNOSIS — E782 Mixed hyperlipidemia: Secondary | ICD-10-CM

## 2024-06-14 DIAGNOSIS — I2510 Atherosclerotic heart disease of native coronary artery without angina pectoris: Secondary | ICD-10-CM

## 2024-06-14 DIAGNOSIS — G4733 Obstructive sleep apnea (adult) (pediatric): Secondary | ICD-10-CM

## 2024-06-14 DIAGNOSIS — E66811 Obesity, class 1: Secondary | ICD-10-CM

## 2024-06-14 NOTE — Progress Notes (Addendum)
 Dr Lundgren - I am trying to submit a prior authorization for patient's Wegovy  2.4mg .    In your office note you state that patient is down from a starting weight of 215 pounds. Do you know where that weight documentation might be?    Weight Documentation:  06/13/2024 - 186lbs - Current weight  12/14/2023 - 189lbs  11/10/2023 - 189lbs  07/13/2023 - 184lbs 6.4oz  05/20/2023 - 184lbs  04/20/2023 - 181lbs 6.4oz  03/10/2023 - 184lbs  10/08/2022 - 185lbs - started Wegovy  09/2022  07/29/2022 - 191lbs  03/30/2022 - 182lbs  10/20/2021 - 190lbs 9.6oz - started Ozempic  therapy  06/04/2021 - 189lbs

## 2024-06-17 ENCOUNTER — Other Ambulatory Visit (INDEPENDENT_AMBULATORY_CARE_PROVIDER_SITE_OTHER): Payer: Self-pay | Admitting: Student

## 2024-06-17 DIAGNOSIS — I2584 Coronary atherosclerosis due to calcified coronary lesion: Secondary | ICD-10-CM

## 2024-06-17 DIAGNOSIS — E6609 Other obesity due to excess calories: Secondary | ICD-10-CM

## 2024-06-17 DIAGNOSIS — E7800 Pure hypercholesterolemia, unspecified: Secondary | ICD-10-CM

## 2024-06-21 ENCOUNTER — Telehealth (INDEPENDENT_AMBULATORY_CARE_PROVIDER_SITE_OTHER): Payer: Self-pay

## 2024-06-21 MED ORDER — LIRAGLUTIDE -WEIGHT MANAGEMENT 18 MG/3ML SC SOPN: PEN_INJECTOR | SUBCUTANEOUS | 0 refills | Status: AC

## 2024-06-21 NOTE — Addendum Note (Signed)
 Addended by: ARGENTINA EARLY on: 06/21/2024 12:35 PM     Modules accepted: Orders

## 2024-06-21 NOTE — Telephone Encounter (Signed)
 Liraglutide  -Weight Management 18MG JONELLE pen-injectors   KeyBETHA HOLDER  PA Case ID #: [id number]  Rx #: [id number]  Prior authorization submitted using Covermymeds  Pending - attached office note from 06/13/2024    Prior Authorization Questions:  1.What is the patient's age? 63 years of age or older    2.What is the patient's diagnosis? Obesity, used for chronic weight management    3.Has the patient been on this medication continuously for the last 4 months excluding samples? NO    4.What is the patient's body mass index (BMI) in kilograms per square meter (kg/m2)? Greater than or equal to 30 kg/m2    5.Is the patient currently using another glucagon-like peptide-1 (GLP-1) receptor agonist? Yes, this is a change in therapy from another GLP-1. Please specify the medication. Wegovy     6.Has the patient participated in a comprehensive weight management program such as [facility] or another weight loss program? YES    7.Will this medication be used in combination with another Prior Authorization (PA) medication for weight loss? No    8.Is this medication being requested as a change from Zepbound ? No    9.Will the patient need more than 15 pre-filled pens every 90 days? No

## 2024-06-22 NOTE — Telephone Encounter (Signed)
 Saxenda  (Liraglutide  -Weight Management) 18MG JONELLE pen-injectors   KeyBETHA HOLDER  PA Case ID #: [id number]  Rx #: [id number]  Prior authorization submitted using Covermymeds  APPROVED  05/22/2024 through 12/18/2024    Patient was notified using MyChartMsg

## 2024-06-23 ENCOUNTER — Other Ambulatory Visit (INDEPENDENT_AMBULATORY_CARE_PROVIDER_SITE_OTHER): Payer: BLUE CROSS/BLUE SHIELD

## 2024-06-23 DIAGNOSIS — E785 Hyperlipidemia, unspecified: Secondary | ICD-10-CM

## 2024-06-23 LAB — COMPREHENSIVE METABOLIC PANEL
ALT: 48 U/L (ref <=55)
AST (SGOT): 47 U/L — ABNORMAL HIGH (ref <=41)
Albumin/Globulin Ratio: 1.4 (ref 0.9–2.2)
Albumin: 4.6 g/dL (ref 3.5–4.9)
Alkaline Phosphatase: 85 U/L (ref 37–117)
Anion Gap: 10 (ref 5.0–15.0)
BUN: 14 mg/dL (ref 7–21)
Bilirubin, Total: 0.9 mg/dL (ref 0.2–1.2)
CO2: 22 meq/L (ref 17–29)
Calcium: 9.8 mg/dL (ref 8.5–10.5)
Chloride: 108 meq/L (ref 99–111)
Creatinine: 0.9 mg/dL (ref 0.4–1.0)
GFR: >60 mL/min/1.73 m2 (ref >=60.0)
Globulin: 3.2 g/dL (ref 2.0–3.6)
Glucose: 72 mg/dL (ref 70–100)
Hemolysis Index: 29 {index}
Potassium: 4.9 meq/L (ref 3.5–5.3)
Protein, Total: 7.8 g/dL (ref 6.0–8.3)
Sodium: 140 meq/L (ref 135–145)

## 2024-06-23 LAB — LIPID PANEL
Cholesterol / HDL Ratio: 2.7 {index}
Cholesterol: 171 mg/dL (ref <=199)
HDL: 64 mg/dL (ref >=40)
LDL Calculated: 83 mg/dL (ref 0–99)
Non-HDL Cholesterol: 107 mg/dL (ref <130.0)
Triglycerides: 139 mg/dL (ref 34–149)
VLDL Calculated: 22 mg/dL (ref 10–40)
